# Patient Record
Sex: Female | Born: 1965
Health system: Southern US, Community
[De-identification: ages and names within clinical notes are randomized; demographics above are authoritative.]

## PROBLEM LIST (undated history)

## (undated) DIAGNOSIS — M5430 Sciatica, unspecified side: Secondary | ICD-10-CM

## (undated) DIAGNOSIS — Z889 Allergy status to unspecified drugs, medicaments and biological substances status: Secondary | ICD-10-CM

## (undated) DIAGNOSIS — M4800 Spinal stenosis, site unspecified: Secondary | ICD-10-CM

## (undated) DIAGNOSIS — H9311 Tinnitus, right ear: Secondary | ICD-10-CM

## (undated) DIAGNOSIS — R51 Headache: Secondary | ICD-10-CM

## (undated) DIAGNOSIS — M549 Dorsalgia, unspecified: Secondary | ICD-10-CM

## (undated) DIAGNOSIS — M797 Fibromyalgia: Secondary | ICD-10-CM

## (undated) DIAGNOSIS — J449 Chronic obstructive pulmonary disease, unspecified: Secondary | ICD-10-CM

## (undated) DIAGNOSIS — F329 Major depressive disorder, single episode, unspecified: Secondary | ICD-10-CM

## (undated) DIAGNOSIS — F32A Depression, unspecified: Secondary | ICD-10-CM

## (undated) DIAGNOSIS — M1990 Unspecified osteoarthritis, unspecified site: Secondary | ICD-10-CM

## (undated) DIAGNOSIS — K219 Gastro-esophageal reflux disease without esophagitis: Secondary | ICD-10-CM

## (undated) DIAGNOSIS — F419 Anxiety disorder, unspecified: Secondary | ICD-10-CM

## (undated) DIAGNOSIS — E039 Hypothyroidism, unspecified: Secondary | ICD-10-CM

## (undated) DIAGNOSIS — M810 Age-related osteoporosis without current pathological fracture: Secondary | ICD-10-CM

## (undated) DIAGNOSIS — M51369 Other intervertebral disc degeneration, lumbar region without mention of lumbar back pain or lower extremity pain: Secondary | ICD-10-CM

## (undated) DIAGNOSIS — K5900 Constipation, unspecified: Secondary | ICD-10-CM

## (undated) DIAGNOSIS — G8929 Other chronic pain: Secondary | ICD-10-CM

## (undated) DIAGNOSIS — R0600 Dyspnea, unspecified: Secondary | ICD-10-CM

## (undated) DIAGNOSIS — R519 Headache, unspecified: Secondary | ICD-10-CM

## (undated) DIAGNOSIS — J45909 Unspecified asthma, uncomplicated: Secondary | ICD-10-CM

## (undated) DIAGNOSIS — J189 Pneumonia, unspecified organism: Secondary | ICD-10-CM

## (undated) DIAGNOSIS — M5136 Other intervertebral disc degeneration, lumbar region: Secondary | ICD-10-CM

## (undated) HISTORY — DX: Sciatica, unspecified side: M54.30

## (undated) HISTORY — PX: CHOLECYSTECTOMY: SHX55

## (undated) HISTORY — DX: Constipation, unspecified: K59.00

## (undated) HISTORY — DX: Unspecified asthma, uncomplicated: J45.909

## (undated) HISTORY — DX: Other intervertebral disc degeneration, lumbar region without mention of lumbar back pain or lower extremity pain: M51.369

## (undated) HISTORY — PX: APPENDECTOMY: SHX54

## (undated) HISTORY — DX: Other intervertebral disc degeneration, lumbar region: M51.36

---

## 1998-04-03 ENCOUNTER — Other Ambulatory Visit: Admission: RE | Admit: 1998-04-03 | Discharge: 1998-04-03 | Payer: Self-pay | Admitting: Family Medicine

## 2001-09-22 ENCOUNTER — Other Ambulatory Visit: Admission: RE | Admit: 2001-09-22 | Discharge: 2001-09-22 | Payer: Self-pay | Admitting: Family Medicine

## 2002-03-20 ENCOUNTER — Observation Stay (HOSPITAL_COMMUNITY): Admission: EM | Admit: 2002-03-20 | Discharge: 2002-03-21 | Payer: Self-pay | Admitting: Emergency Medicine

## 2002-03-21 ENCOUNTER — Inpatient Hospital Stay (HOSPITAL_COMMUNITY): Admission: EM | Admit: 2002-03-21 | Discharge: 2002-03-24 | Payer: Self-pay | Admitting: Psychiatry

## 2003-05-12 ENCOUNTER — Other Ambulatory Visit: Admission: RE | Admit: 2003-05-12 | Discharge: 2003-05-12 | Payer: Self-pay | Admitting: Family Medicine

## 2003-05-15 ENCOUNTER — Other Ambulatory Visit: Admission: RE | Admit: 2003-05-15 | Discharge: 2003-05-15 | Payer: Self-pay | Admitting: Family Medicine

## 2003-07-28 ENCOUNTER — Emergency Department (HOSPITAL_COMMUNITY): Admission: EM | Admit: 2003-07-28 | Discharge: 2003-07-28 | Payer: Self-pay | Admitting: Emergency Medicine

## 2006-09-15 DIAGNOSIS — J189 Pneumonia, unspecified organism: Secondary | ICD-10-CM

## 2006-09-15 HISTORY — DX: Pneumonia, unspecified organism: J18.9

## 2006-10-17 ENCOUNTER — Emergency Department (HOSPITAL_COMMUNITY): Admission: EM | Admit: 2006-10-17 | Discharge: 2006-10-17 | Payer: Self-pay | Admitting: Emergency Medicine

## 2012-02-05 ENCOUNTER — Ambulatory Visit: Payer: BC Managed Care – PPO | Attending: Orthopaedic Surgery | Admitting: Physical Therapy

## 2012-02-05 DIAGNOSIS — R5381 Other malaise: Secondary | ICD-10-CM | POA: Insufficient documentation

## 2012-02-05 DIAGNOSIS — R293 Abnormal posture: Secondary | ICD-10-CM | POA: Insufficient documentation

## 2012-02-05 DIAGNOSIS — M2560 Stiffness of unspecified joint, not elsewhere classified: Secondary | ICD-10-CM | POA: Insufficient documentation

## 2012-02-05 DIAGNOSIS — M542 Cervicalgia: Secondary | ICD-10-CM | POA: Insufficient documentation

## 2012-02-05 DIAGNOSIS — IMO0001 Reserved for inherently not codable concepts without codable children: Secondary | ICD-10-CM | POA: Insufficient documentation

## 2012-02-10 ENCOUNTER — Ambulatory Visit: Payer: BC Managed Care – PPO | Admitting: Physical Therapy

## 2012-02-12 ENCOUNTER — Ambulatory Visit: Payer: BC Managed Care – PPO | Admitting: Physical Therapy

## 2012-02-17 ENCOUNTER — Ambulatory Visit: Payer: BC Managed Care – PPO | Attending: Orthopaedic Surgery | Admitting: *Deleted

## 2012-02-17 DIAGNOSIS — IMO0001 Reserved for inherently not codable concepts without codable children: Secondary | ICD-10-CM | POA: Insufficient documentation

## 2012-02-17 DIAGNOSIS — R5381 Other malaise: Secondary | ICD-10-CM | POA: Insufficient documentation

## 2012-02-17 DIAGNOSIS — M2560 Stiffness of unspecified joint, not elsewhere classified: Secondary | ICD-10-CM | POA: Insufficient documentation

## 2012-02-17 DIAGNOSIS — M542 Cervicalgia: Secondary | ICD-10-CM | POA: Insufficient documentation

## 2012-02-17 DIAGNOSIS — R293 Abnormal posture: Secondary | ICD-10-CM | POA: Insufficient documentation

## 2012-02-19 ENCOUNTER — Encounter: Payer: BC Managed Care – PPO | Admitting: *Deleted

## 2012-02-24 ENCOUNTER — Ambulatory Visit: Payer: BC Managed Care – PPO | Admitting: Physical Therapy

## 2012-02-26 ENCOUNTER — Ambulatory Visit: Payer: BC Managed Care – PPO | Admitting: Physical Therapy

## 2012-04-06 ENCOUNTER — Ambulatory Visit: Payer: BC Managed Care – PPO | Attending: Orthopaedic Surgery | Admitting: Physical Therapy

## 2012-04-06 DIAGNOSIS — M542 Cervicalgia: Secondary | ICD-10-CM | POA: Insufficient documentation

## 2012-04-06 DIAGNOSIS — R293 Abnormal posture: Secondary | ICD-10-CM | POA: Insufficient documentation

## 2012-04-06 DIAGNOSIS — M2560 Stiffness of unspecified joint, not elsewhere classified: Secondary | ICD-10-CM | POA: Insufficient documentation

## 2012-04-06 DIAGNOSIS — IMO0001 Reserved for inherently not codable concepts without codable children: Secondary | ICD-10-CM | POA: Insufficient documentation

## 2012-04-06 DIAGNOSIS — R5381 Other malaise: Secondary | ICD-10-CM | POA: Insufficient documentation

## 2012-04-08 ENCOUNTER — Ambulatory Visit: Payer: BC Managed Care – PPO | Admitting: *Deleted

## 2012-04-13 ENCOUNTER — Ambulatory Visit: Payer: BC Managed Care – PPO | Admitting: *Deleted

## 2012-04-15 ENCOUNTER — Ambulatory Visit: Payer: BC Managed Care – PPO | Attending: Orthopaedic Surgery | Admitting: *Deleted

## 2012-04-15 DIAGNOSIS — M2560 Stiffness of unspecified joint, not elsewhere classified: Secondary | ICD-10-CM | POA: Insufficient documentation

## 2012-04-15 DIAGNOSIS — R293 Abnormal posture: Secondary | ICD-10-CM | POA: Insufficient documentation

## 2012-04-15 DIAGNOSIS — M542 Cervicalgia: Secondary | ICD-10-CM | POA: Insufficient documentation

## 2012-04-15 DIAGNOSIS — R5381 Other malaise: Secondary | ICD-10-CM | POA: Insufficient documentation

## 2012-04-15 DIAGNOSIS — IMO0001 Reserved for inherently not codable concepts without codable children: Secondary | ICD-10-CM | POA: Insufficient documentation

## 2013-10-03 ENCOUNTER — Encounter (HOSPITAL_COMMUNITY): Payer: Self-pay | Admitting: Emergency Medicine

## 2013-10-03 ENCOUNTER — Emergency Department (HOSPITAL_COMMUNITY): Payer: BC Managed Care – PPO

## 2013-10-03 ENCOUNTER — Emergency Department (HOSPITAL_COMMUNITY)
Admission: EM | Admit: 2013-10-03 | Discharge: 2013-10-03 | Disposition: A | Discharge status: Home / Self Care | Payer: BC Managed Care – PPO | Attending: Emergency Medicine | Admitting: Emergency Medicine

## 2013-10-03 DIAGNOSIS — R000 Tachycardia, unspecified: Secondary | ICD-10-CM | POA: Insufficient documentation

## 2013-10-03 DIAGNOSIS — J441 Chronic obstructive pulmonary disease with (acute) exacerbation: Secondary | ICD-10-CM | POA: Insufficient documentation

## 2013-10-03 DIAGNOSIS — Z792 Long term (current) use of antibiotics: Secondary | ICD-10-CM | POA: Insufficient documentation

## 2013-10-03 DIAGNOSIS — F17200 Nicotine dependence, unspecified, uncomplicated: Secondary | ICD-10-CM | POA: Insufficient documentation

## 2013-10-03 HISTORY — DX: Chronic obstructive pulmonary disease, unspecified: J44.9

## 2013-10-03 LAB — CBC WITH DIFFERENTIAL/PLATELET
Basophils Absolute: 0 10*3/uL (ref 0.0–0.1)
Basophils Relative: 0 % (ref 0–1)
Eosinophils Absolute: 0 10*3/uL (ref 0.0–0.7)
Eosinophils Relative: 0 % (ref 0–5)
HCT: 43.2 % (ref 36.0–46.0)
Hemoglobin: 15.5 g/dL — ABNORMAL HIGH (ref 12.0–15.0)
Lymphocytes Relative: 24 % (ref 12–46)
Lymphs Abs: 2.2 10*3/uL (ref 0.7–4.0)
MCH: 34.9 pg — ABNORMAL HIGH (ref 26.0–34.0)
MCHC: 35.9 g/dL (ref 30.0–36.0)
MCV: 97.3 fL (ref 78.0–100.0)
Monocytes Absolute: 0.7 10*3/uL (ref 0.1–1.0)
Monocytes Relative: 8 % (ref 3–12)
Neutro Abs: 6.4 10*3/uL (ref 1.7–7.7)
Neutrophils Relative %: 68 % (ref 43–77)
Platelets: 255 10*3/uL (ref 150–400)
RBC: 4.44 MIL/uL (ref 3.87–5.11)
RDW: 12.8 % (ref 11.5–15.5)
WBC: 9.4 10*3/uL (ref 4.0–10.5)

## 2013-10-03 LAB — BASIC METABOLIC PANEL
BUN: 16 mg/dL (ref 6–23)
CO2: 22 mEq/L (ref 19–32)
Calcium: 8.9 mg/dL (ref 8.4–10.5)
Chloride: 106 mEq/L (ref 96–112)
Creatinine, Ser: 0.79 mg/dL (ref 0.50–1.10)
GFR calc Af Amer: >90 mL/min (ref >90)
GFR calc non Af Amer: >90 mL/min (ref >90)
Glucose, Bld: 113 mg/dL — ABNORMAL HIGH (ref 70–99)
Potassium: 3.5 mEq/L — ABNORMAL LOW (ref 3.7–5.3)
Sodium: 142 mEq/L (ref 137–147)

## 2013-10-03 MED ORDER — POTASSIUM CHLORIDE CRYS ER 20 MEQ PO TBCR
20.0000 meq | EXTENDED_RELEASE_TABLET | Freq: Once | ORAL | Status: AC
  Administered 2013-10-03: 20 meq via ORAL
  Filled 2013-10-03: qty 1

## 2013-10-03 MED ORDER — IBUPROFEN 400 MG PO TABS
600.0000 mg | ORAL_TABLET | Freq: Once | ORAL | Status: AC
  Administered 2013-10-03: 600 mg via ORAL
  Filled 2013-10-03: qty 2

## 2013-10-03 MED ORDER — PREDNISONE 20 MG PO TABS: 40.0000 mg | ORAL_TABLET | Freq: Every day | ORAL | Status: DC

## 2013-10-03 MED ORDER — GUAIFENESIN-CODEINE 100-10 MG/5ML PO SOLN
10.0000 mL | Freq: Once | ORAL | Status: AC
  Administered 2013-10-03: 10 mL via ORAL
  Filled 2013-10-03: qty 10

## 2013-10-03 MED ORDER — IPRATROPIUM BROMIDE 0.02 % IN SOLN
0.5000 mg | Freq: Once | RESPIRATORY_TRACT | Status: AC
  Administered 2013-10-03: 0.5 mg via RESPIRATORY_TRACT
  Filled 2013-10-03: qty 2.5

## 2013-10-03 MED ORDER — ALBUTEROL SULFATE HFA 108 (90 BASE) MCG/ACT IN AERS
2.0000 | INHALATION_SPRAY | Freq: Once | RESPIRATORY_TRACT | Status: AC
  Administered 2013-10-03: 2 via RESPIRATORY_TRACT
  Filled 2013-10-03: qty 6.7

## 2013-10-03 MED ORDER — ALBUTEROL SULFATE (2.5 MG/3ML) 0.083% IN NEBU
5.0000 mg | INHALATION_SOLUTION | Freq: Once | RESPIRATORY_TRACT | Status: AC
  Administered 2013-10-03: 5 mg via RESPIRATORY_TRACT
  Filled 2013-10-03: qty 6

## 2013-10-03 MED ORDER — ALBUTEROL SULFATE (2.5 MG/3ML) 0.083% IN NEBU: 2.5000 mg | INHALATION_SOLUTION | Freq: Four times a day (QID) | RESPIRATORY_TRACT | Status: DC | PRN

## 2013-10-03 MED ORDER — GUAIFENESIN-CODEINE 100-10 MG/5ML PO SOLN
ORAL | Status: AC
  Filled 2013-10-03: qty 5

## 2013-10-03 MED ORDER — METHYLPREDNISOLONE SODIUM SUCC 125 MG IJ SOLR
125.0000 mg | Freq: Once | INTRAMUSCULAR | Status: AC
  Administered 2013-10-03: 125 mg via INTRAVENOUS
  Filled 2013-10-03: qty 2

## 2013-10-03 NOTE — ED Notes (Signed)
Patient states she has seen her doctor in December and the doctor "thought I had a touch of bronchitis because I have COPD, but it has turned into pneumonia before." Per patient, she has taken six days of her antibiotic and she has four left. "You think I would be feeling better by now."

## 2013-10-03 NOTE — Discharge Instructions (Signed)
Chronic Obstructive Pulmonary Disease Exacerbation °Chronic obstructive pulmonary disease (COPD) is a common lung condition in which airflow from the lungs is limited. COPD is a general term that can be used to describe many different lung problems that limit airflow, including chronic bronchitis and emphysema. COPD exacerbations are episodes when breathing symptoms become much worse and require extra treatment. Without treatment, COPD exacerbations can be life threatening, and frequent COPD exacerbations can cause further damage to your lungs. °CAUSES  °· Respiratory infections.   °· Exposure to smoke.   °· Exposure to air pollution, chemical fumes, or dust. °Sometimes there is no apparent cause or trigger. °RISK FACTORS °· Smoking cigarettes. °· Older age. °· Frequent prior COPD exacerbations. °SIGNS AND SYMPTOMS  °· Increased coughing.   °· Increased thick spit (sputum) production.   °· Increased wheezing.   °· Increased shortness of breath.   °· Rapid breathing.   °· Chest tightness. °DIAGNOSIS  °Your medical history, a physical exam, and tests will help your health care provider make a diagnosis. Tests may include: °· A chest X-ray. °· Basic lab tests. °· Sputum testing. °· An arterial blood gas test. °TREATMENT  °Depending on the severity of your COPD exacerbation, you may need to be admitted to a hospital for treatment. Some of the treatments commonly used to treat COPD exacerbations are:  °· Antibiotic medicines.   °· Bronchodilators. These are drugs that expand the air passages. They may be given with an inhaler or nebulizer. Spacer devices may be needed to help improve drug delivery. °· Corticosteroid medicines. °· Supplemental oxygen therapy.   °HOME CARE INSTRUCTIONS  °· Do not smoke. Quitting smoking is very important to prevent COPD from getting worse and exacerbations from happening as often. °· Avoid exposure to all substances that irritate the airway, especially to tobacco smoke.   °· If prescribed,  take your antibiotics as directed. Finish them even if you start to feel better. °· Only take over-the-counter or prescription medicines as directed by your health care provider. It is important to use correct technique with inhaled medicines. °· Drink enough fluids to keep your urine clear or pale yellow (unless you have a medical condition that requires fluid restriction). °· Use a cool mist vaporizer. This makes it easier to clear your chest when you cough.   °· If you have a home nebulizer and oxygen, continue to use them as directed.   °· Maintain all necessary vaccinations to prevent infections.   °· Exercise regularly.   °· Eat a healthy diet.   °· Keep all follow-up appointments as directed by your health care provider. °SEEK IMMEDIATE MEDICAL CARE IF: °· You have worsening shortness of breath.   °· You have trouble talking.   °· You have severe chest pain. °· You have blood in your sputum.  °· You have a fever. °· You have weakness, vomit repeatedly, or faint.   °· You feel confused.   °· You continue to get worse. °MAKE SURE YOU:  °· Understand these instructions. °· Will watch your condition. °· Will get help right away if you are not doing well or get worse. °Document Released: 06/29/2007 Document Revised: 06/22/2013 Document Reviewed: 05/06/2013 °[employer]® Patient Information ©2014 [employer]. ° °

## 2013-10-03 NOTE — ED Notes (Signed)
Patient is currently taking Albuterol treatment.

## 2013-10-03 NOTE — ED Notes (Signed)
Patient transported to X-ray 

## 2013-10-03 NOTE — ED Provider Notes (Signed)
CSN: [id number]     Arrival date & time 10/03/13  0500 History   First MD Initiated Contact with Patient 10/03/13 [phone]     Chief Complaint  Patient presents with  . Shortness of Breath   (Consider location/radiation/quality/duration/timing/severity/associated sxs/prior Treatment) HPI  48 year old female with shortness of breath. Patient has a past history COPD. She's not on home oxygen. She continues to smoke. Her breathing began worsening approximately one and half weeks ago. Increased frequency of her cough. Shortness of breath with less exertion than typical. Diffuse body aches. She denies any specific chest pain. Subjective fever. No unusual leg pain or swelling. She reports was called in a prescription for steroids and erythromycin by her PCP approximately one week ago. She still and antibiotics were discussed with her steroids. Despite this, she feels like her symptoms are continuing to worsen.  Past Medical History  Diagnosis Date  . COPD (chronic obstructive pulmonary disease)    Past Surgical History  Procedure Laterality Date  . Cholecystectomy     History reviewed. No pertinent family history. History  Substance Use Topics  . Smoking status: Current Every Day Smoker  . Smokeless tobacco: Not on file  . Alcohol Use: No   OB History   Grav Para Term Preterm Abortions TAB SAB Ect Mult Living                 Review of Systems  All systems reviewed and negative, other than as noted in HPI.   Allergies  Morphine and related  Home Medications   Current Outpatient Rx  Name  Route  Sig  Dispense  Refill  . clarithromycin (BIAXIN) 500 MG tablet   Oral   Take 500 mg by mouth 2 (two) times daily.          BP 131/80  Pulse 98  Temp(Src) 97.6 F (36.4 C) (Oral)  Resp 24  SpO2 97% Physical Exam  Nursing note and vitals reviewed. Constitutional: She appears well-developed and well-nourished. No distress.  HENT:  Head: Normocephalic and atraumatic.  Eyes:  Conjunctivae are normal. Right eye exhibits no discharge. Left eye exhibits no discharge.  Neck: Neck supple.  Cardiovascular: Regular rhythm and normal heart sounds.  Exam reveals no gallop and no friction rub.   No murmur heard. Tachycardic  Pulmonary/Chest: Effort normal and breath sounds normal. No respiratory distress.  Expiratory wheezing bilaterally. Prolonged expiratory phase.  Abdominal: Soft. She exhibits no distension. There is no tenderness.  Musculoskeletal: She exhibits no edema and no tenderness.  Lower extremities symmetric as compared to each other. No calf tenderness. Negative Homan's. No palpable cords.   Neurological: She is alert.  Skin: Skin is warm and dry.  Psychiatric: She has a normal mood and affect. Her behavior is normal. Thought content normal.    ED Course  Procedures (including critical care time) Labs Review Labs Reviewed  CBC WITH DIFFERENTIAL - Abnormal; Notable for the following:    Hemoglobin 15.5 (*)    MCH 34.9 (*)    All other components within normal limits  BASIC METABOLIC PANEL - Abnormal; Notable for the following:    Potassium 3.5 (*)    Glucose, Bld 113 (*)    All other components within normal limits   Imaging Review Dg Chest 2 View  10/03/2013   CLINICAL DATA:  Cough, congestion  EXAM: CHEST  2 VIEW  COMPARISON:  December 06, 2008  FINDINGS: The heart size and mediastinal contours are within normal limits. There is no  focal infiltrate, pulmonary edema, or pleural effusion. The visualized skeletal structures are stable.  IMPRESSION: No active cardiopulmonary disease.   Electronically Signed   By: Abelardo Diesel M.D.   On: 10/03/2013 07:39    EKG Interpretation    Date/Time:  Monday October 03 2013 05:49:00 EST Ventricular Rate:  98 PR Interval:  132 QRS Duration: 76 QT Interval:  354 QTC Calculation: 451 R Axis:   79 Text Interpretation:  Normal sinus rhythm Normal ECG No previous ECGs available Confirmed by Alzada Brazee  MD, Quanna Wittke  ([id number]) on 10/03/2013 7:22:57 AM            MDM   1. COPD exacerbation     48 year old female with cough and shortness of breath. Likely COPD exacerbation. Oxygen saturations are adequate room air. Her chest x-ray does not show any focal infiltrate, but given change in her cough and increasing shortness of breath or history of underlying COPD, will treat with antibiotics. Feel she is appropriate for outpatient treatment at this time. Steroids. As needed bronchodilators. Return precautions discussed. Outpatient followup otherwise.    Virgel Manifold, MD 10/04/13 [phone]

## 2013-10-03 NOTE — ED Notes (Signed)
Pt c/o sob and generalized bodyaches x 1 wk.

## 2013-10-11 ENCOUNTER — Ambulatory Visit (INDEPENDENT_AMBULATORY_CARE_PROVIDER_SITE_OTHER): Payer: BC Managed Care – PPO | Admitting: Emergency Medicine

## 2013-10-11 ENCOUNTER — Encounter: Payer: Self-pay | Admitting: Emergency Medicine

## 2013-10-11 VITALS — BP 124/88 | HR 88 | Ht 64.0 in | Wt 139.4 lb

## 2013-10-11 DIAGNOSIS — J101 Influenza due to other identified influenza virus with other respiratory manifestations: Secondary | ICD-10-CM | POA: Insufficient documentation

## 2013-10-11 DIAGNOSIS — J111 Influenza due to unidentified influenza virus with other respiratory manifestations: Secondary | ICD-10-CM

## 2013-10-11 DIAGNOSIS — J449 Chronic obstructive pulmonary disease, unspecified: Secondary | ICD-10-CM

## 2013-10-11 DIAGNOSIS — B370 Candidal stomatitis: Secondary | ICD-10-CM

## 2013-10-11 MED ORDER — FLUCONAZOLE 100 MG PO TABS: 100.0000 mg | ORAL_TABLET | Freq: Every day | ORAL | Status: DC

## 2013-10-11 MED ORDER — OSELTAMIVIR PHOSPHATE 75 MG PO CAPS: 75.0000 mg | ORAL_CAPSULE | Freq: Two times a day (BID) | ORAL | Status: DC

## 2013-10-11 MED ORDER — HYDROCODONE-HOMATROPINE 5-1.5 MG/5ML PO SYRP: 5.0000 mL | ORAL_SOLUTION | Freq: Four times a day (QID) | ORAL | Status: DC | PRN

## 2013-10-11 NOTE — Progress Notes (Signed)
Subjective:    Patient ID: Gloria Calderon, female    DOB: [birth year], 48 y.o.   MRN: [id number]  HPI 48 yo woman, smoker 60 pk-yrs, hx of allergies. She is referred for COPD after a recent trip to ED. She averages an episode of bronchitis annually, was started on abx. She continued to cough and have trouble prompting trip to the [facility] ED on 10/03/13. She finished abx and prednisone. Her breathing has improved. Continues to have aches, fever, HA, now with N/V.     Review of Systems  Constitutional: Positive for appetite change. Negative for fever and unexpected weight change.  HENT: Positive for congestion, postnasal drip, sinus pressure and sore throat. Negative for dental problem, ear pain, nosebleeds, rhinorrhea, sneezing and trouble swallowing.   Eyes: Negative for redness and itching.  Respiratory: Positive for cough, chest tightness, shortness of breath and wheezing.   Cardiovascular: Negative for palpitations and leg swelling.  Gastrointestinal: Positive for abdominal pain. Negative for nausea and vomiting.  Genitourinary: Negative for dysuria.  Musculoskeletal: Negative for joint swelling.  Skin: Negative for rash.  Neurological: Positive for headaches.  Hematological: Does not bruise/bleed easily.  Psychiatric/Behavioral: Negative for dysphoric mood. The patient is not nervous/anxious.     Past Medical History  Diagnosis Date  . COPD (chronic obstructive pulmonary disease)      No family history on file.   History   Social History  . Marital Status: Legally Separated    Spouse Name: N/A    Number of Children: N/A  . Years of Education: N/A   Occupational History  . Not on file.   Social History Main Topics  . Smoking status: Current Every Day Smoker -- 0.25 packs/day for 30 years    Types: Cigarettes  . Smokeless tobacco: Not on file  . Alcohol Use: No  . Drug Use: Not on file  . Sexual Activity: Not on file   Other Topics Concern  . Not on file   Social  History Narrative  . 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  [place] native No military   Allergies  Allergen Reactions  . Morphine And Related      Outpatient Prescriptions Prior to Visit  Medication Sig Dispense Refill  . albuterol (PROVENTIL) (2.5 MG/3ML) 0.083% nebulizer solution Take 3 mLs (2.5 mg total) by nebulization every 6 (six) hours as needed for wheezing or shortness of breath.  75 mL  3  . clarithromycin (BIAXIN) 500 MG tablet Take 500 mg by mouth 2 (two) times daily.      . predniSONE (DELTASONE) 20 MG tablet Take 2 tablets (40 mg total) by mouth daily.  10 tablet  0   No facility-administered medications prior to visit.        Objective:   Physical Exam Filed Vitals:   10/11/13 1539  BP: 124/88  Pulse: 88  Height: 5\' 4"  (1.626 m)  Weight: 139 lb 6.4 oz (63.231 kg)  SpO2: 97%   Gen: Pleasant, well-nourished, in no distress, ill appearing  ENT: No lesions,  mouth clear,  oropharynx clear, no postnasal drip  Neck: No JVD, no TMG, no carotid bruits  Lungs: No use of accessory muscles, B low pitched wheezes  Cardiovascular: RRR, heart sounds normal, no murmur or gallops, no peripheral edema  Musculoskeletal: No deformities, no cyanosis or clubbing  Neuro: alert, non focal  Skin: Warm, no lesions or rashes      Assessment & Plan:  Influenza with  respiratory manifestations - symptomatic relief - tamiflu script - hycodan   COPD (chronic obstructive pulmonary disease) - high risk for another AE; she will call if she develops sx. Will treat again with pred + abx if so - full PFT - a1-AT in the future - start scheduled BD in the future  Thrush - treat with fluconazole x 3 days

## 2013-10-11 NOTE — Assessment & Plan Note (Signed)
-   symptomatic relief - tamiflu script - hycodan

## 2013-10-11 NOTE — Patient Instructions (Signed)
We will give a script for tamiflu - check with the pharmacy to see how much it would cost Use OTC cold-flu decongestants for symptoms We will perform pulmonary function tests and blood work after you have recovered from this illness Use hycodan as needed for cough Follow with Dr Lamonte Sakai in 1 month

## 2013-10-11 NOTE — Assessment & Plan Note (Addendum)
-   treat with fluconazole x 3 days - ? Etiology, consider checking HIV next visit

## 2013-10-11 NOTE — Assessment & Plan Note (Signed)
-   high risk for another AE; she will call if she develops sx. Will treat again with pred + abx if so - full PFT - a1-AT in the future - start scheduled BD in the future

## 2013-11-11 ENCOUNTER — Other Ambulatory Visit: Payer: Self-pay | Admitting: *Deleted

## 2013-11-15 ENCOUNTER — Encounter: Payer: Self-pay | Admitting: Emergency Medicine

## 2013-11-15 ENCOUNTER — Ambulatory Visit (INDEPENDENT_AMBULATORY_CARE_PROVIDER_SITE_OTHER): Payer: BC Managed Care – PPO | Admitting: Emergency Medicine

## 2013-11-15 ENCOUNTER — Other Ambulatory Visit: Payer: BC Managed Care – PPO

## 2013-11-15 VITALS — BP 142/90 | HR 106 | Ht 65.0 in | Wt 140.0 lb

## 2013-11-15 DIAGNOSIS — J449 Chronic obstructive pulmonary disease, unspecified: Secondary | ICD-10-CM

## 2013-11-15 NOTE — Assessment & Plan Note (Signed)
-   trial spiriva x 1 month - walking oximetry - PFT  - a1-AT - rov 1

## 2013-11-15 NOTE — Patient Instructions (Signed)
We will perform full breathing tests Walking oximetry today We will start Spiriva once a day Blood work today  We agreed that you would try to decrease your cigarettes to 1 pack a day for the next month.  Follow with Dr Lamonte Sakai in 1 month

## 2013-11-15 NOTE — Progress Notes (Signed)
  Subjective:    Patient ID: Gloria Calderon, female    DOB: 1966-06-11, 48 y.o.   MRN: [id number]  HPI 48 yo woman, smoker 60 pk-yrs, hx of allergies. She is referred for COPD after a recent trip to ED. She averages an episode of bronchitis annually, was started on abx. She continued to cough and have trouble prompting trip to the [facility] ED on 10/03/13. She finished abx and prednisone. Her breathing has improved. Continues to have aches, fever, HA, now with N/V.    ROV 11/15/13 -- hx tobacco and new dx COPD. Last time we treated her for presumed influenza - she never took the tamiflu. Still smoking ~1pk/day.    Review of Systems  Constitutional: Positive for appetite change. Negative for fever and unexpected weight change.  HENT: Positive for congestion, postnasal drip, sinus pressure and sore throat. Negative for dental problem, ear pain, nosebleeds, rhinorrhea, sneezing and trouble swallowing.   Eyes: Negative for redness and itching.  Respiratory: Positive for cough, chest tightness, shortness of breath and wheezing.   Cardiovascular: Negative for palpitations and leg swelling.  Gastrointestinal: Positive for abdominal pain. Negative for nausea and vomiting.  Genitourinary: Negative for dysuria.  Musculoskeletal: Negative for joint swelling.  Skin: Negative for rash.  Neurological: Positive for headaches.  Hematological: Does not bruise/bleed easily.  Psychiatric/Behavioral: Negative for dysphoric mood. The patient is not nervous/anxious.        Objective:   Physical Exam Filed Vitals:   11/15/13 1356  BP: 142/90  Pulse: 106  Height: 5\' 5"  (1.651 m)  Weight: 140 lb (63.504 kg)  SpO2: 98%   Gen: Pleasant, well-nourished, in no distress, ill appearing  ENT: No lesions,  mouth clear,  oropharynx clear, no postnasal drip  Neck: No JVD, no TMG, no carotid bruits  Lungs: No use of accessory muscles, B low pitched wheezes  Cardiovascular: RRR, heart sounds normal, no murmur or  gallops, no peripheral edema  Musculoskeletal: No deformities, no cyanosis or clubbing  Neuro: alert, non focal  Skin: Warm, no lesions or rashes      Assessment & Plan:  COPD (chronic obstructive pulmonary disease) - trial spiriva x 1 month - walking oximetry - PFT  - a1-AT - rov 1

## 2013-11-16 ENCOUNTER — Telehealth: Payer: Self-pay | Admitting: Emergency Medicine

## 2013-11-16 NOTE — Telephone Encounter (Signed)
Send nystatin S&S bid x 4 days

## 2013-11-16 NOTE — Telephone Encounter (Signed)
Pt states that Dr Lamonte Sakai was supposed to have sent in a rx for mouthwash for thrush.  Pharmacy never received this and no record of it in EMR.  Dr Lamonte Sakai, please advise what to send in.

## 2013-11-16 NOTE — Telephone Encounter (Signed)
Spoke with pt. She wanted to make sure that Spiriva was supposed to be a capsule and not a tablet. Advised that Spiriva only comes in capsule form. She agreed and verbalized understanding. Nothing further was needed.

## 2013-11-17 ENCOUNTER — Telehealth: Payer: Self-pay | Admitting: Emergency Medicine

## 2013-11-17 MED ORDER — NYSTATIN 100000 UNIT/GM EX POWD: CUTANEOUS | Status: DC

## 2013-11-17 NOTE — Telephone Encounter (Signed)
lmtcb x1 Need to know which prescription it is that she needs.

## 2013-11-17 NOTE — Telephone Encounter (Signed)
Called and left detailed message advising pt had sent nystatin swish and swollow into pharm.  Nothing further needed

## 2013-11-18 MED ORDER — NYSTATIN 100000 UNIT/ML MT SUSP: 5.0000 mL | Freq: Two times a day (BID) | OROMUCOSAL | Status: DC

## 2013-11-18 NOTE — Telephone Encounter (Signed)
Pt returning call.Gloria Calderon ° °

## 2013-11-18 NOTE — Telephone Encounter (Signed)
According to previous phone note pt was requesting nystatin mouth wash. This was called in 11-17-13 AM to [facility] I ATC to check on rx but they are closed until 9am. I will call back. I also LMTCBx2 for the [place], CMA

## 2013-11-18 NOTE — Telephone Encounter (Signed)
Pt is calling about thrush prescription. Per RB pt should be given Nystatin suspension. The previous message about this, nystatin powder was sent in. [facility] is giving the pt a hard time about this. Correct prescription has been sent in. Nothing further is needed. Advised pt to call back if there are any further problems.

## 2013-11-21 LAB — ALPHA-1 ANTITRYPSIN PHENOTYPE: A1 ANTITRYPSIN: 138 mg/dL (ref 83–199)

## 2013-12-07 ENCOUNTER — Telehealth: Payer: Self-pay | Admitting: Emergency Medicine

## 2013-12-07 NOTE — Telephone Encounter (Signed)
Advised pt that it is ok to use allergy medication for sinus symptoms. Nothing further needed

## 2013-12-12 ENCOUNTER — Other Ambulatory Visit: Payer: Self-pay | Admitting: Emergency Medicine

## 2013-12-12 DIAGNOSIS — J449 Chronic obstructive pulmonary disease, unspecified: Secondary | ICD-10-CM

## 2013-12-19 ENCOUNTER — Ambulatory Visit: Payer: BC Managed Care – PPO | Admitting: Emergency Medicine

## 2013-12-23 ENCOUNTER — Ambulatory Visit: Payer: BC Managed Care – PPO | Admitting: Emergency Medicine

## 2013-12-28 ENCOUNTER — Encounter (HOSPITAL_COMMUNITY): Payer: BC Managed Care – PPO

## 2013-12-28 ENCOUNTER — Ambulatory Visit: Payer: BC Managed Care – PPO | Admitting: Emergency Medicine

## 2014-01-05 ENCOUNTER — Ambulatory Visit (INDEPENDENT_AMBULATORY_CARE_PROVIDER_SITE_OTHER): Payer: BC Managed Care – PPO | Admitting: Emergency Medicine

## 2014-01-05 ENCOUNTER — Encounter: Payer: Self-pay | Admitting: Emergency Medicine

## 2014-01-05 ENCOUNTER — Ambulatory Visit (HOSPITAL_COMMUNITY)
Admission: RE | Admit: 2014-01-05 | Discharge: 2014-01-05 | Disposition: A | Discharge status: Home / Self Care | Payer: BC Managed Care – PPO | Source: Ambulatory Visit | Attending: Emergency Medicine | Admitting: Emergency Medicine

## 2014-01-05 VITALS — BP 122/88 | HR 126 | Ht 63.0 in | Wt 140.0 lb

## 2014-01-05 DIAGNOSIS — J449 Chronic obstructive pulmonary disease, unspecified: Secondary | ICD-10-CM

## 2014-01-05 DIAGNOSIS — J4489 Other specified chronic obstructive pulmonary disease: Secondary | ICD-10-CM | POA: Insufficient documentation

## 2014-01-05 LAB — PULMONARY FUNCTION TEST
DL/VA % pred: 52 %
DL/VA: 2.46 ml/min/mmHg/L
DLCO UNC % PRED: 48 %
DLCO unc: 11.06 ml/min/mmHg
FEF 25-75 Post: 0.45 L/sec
FEF 25-75 Pre: 0.69 L/sec
FEF2575-%CHANGE-POST: -34 %
FEF2575-%PRED-PRE: 24 %
FEF2575-%Pred-Post: 16 %
FEV1-%Change-Post: -15 %
FEV1-%PRED-POST: 45 %
FEV1-%Pred-Pre: 53 %
FEV1-PRE: 1.48 L
FEV1-Post: 1.25 L
FEV1FVC-%Change-Post: -4 %
FEV1FVC-%Pred-Pre: 61 %
FEV6-%Change-Post: -12 %
FEV6-%PRED-POST: 76 %
FEV6-%PRED-PRE: 87 %
FEV6-POST: 2.55 L
FEV6-PRE: 2.93 L
FEV6FVC-%Change-Post: -1 %
FEV6FVC-%PRED-PRE: 101 %
FEV6FVC-%Pred-Post: 100 %
FVC-%CHANGE-POST: -11 %
FVC-%PRED-POST: 75 %
FVC-%Pred-Pre: 85 %
FVC-POST: 2.62 L
FVC-PRE: 2.97 L
PRE FEV1/FVC RATIO: 50 %
PRE FEV6/FVC RATIO: 99 %
Post FEV1/FVC ratio: 48 %
Post FEV6/FVC ratio: 98 %
RV % pred: 122 %
RV: 2.08 L
TLC % pred: 109 %
TLC: 5.35 L

## 2014-01-05 MED ORDER — ALBUTEROL SULFATE (2.5 MG/3ML) 0.083% IN NEBU
2.5000 mg | INHALATION_SOLUTION | Freq: Once | RESPIRATORY_TRACT | Status: AC
  Administered 2014-01-05: 2.5 mg via RESPIRATORY_TRACT

## 2014-01-05 NOTE — Progress Notes (Signed)
  Subjective:    Patient ID: Gloria Calderon, female    DOB: 1965/12/30, 48 y.o.   MRN: [id number]  HPI 48 yo woman, smoker 60 pk-yrs, hx of allergies. She is referred for COPD after a recent trip to ED. She averages an episode of bronchitis annually, was started on abx. She continued to cough and have trouble prompting trip to the [facility] ED on 10/03/13. She finished abx and prednisone. Her breathing has improved. Continues to have aches, fever, HA, now with N/V.    ROV 11/15/13 -- hx tobacco and new dx COPD. Last time we treated her for presumed influenza - she never took the tamiflu. Still smoking ~1pk/day.   ROV 01/05/14 -- follows up for tobacco and COPD. Smoking 1 pk a day. Started spiriva last time and she benefited. She needs albuterol by the end of the day. Coughing less. Was just treated for an AE with abx and pred by Dr Melina Copa.    Review of Systems  Constitutional: Positive for appetite change. Negative for fever and unexpected weight change.  HENT: Positive for congestion, postnasal drip, sinus pressure and sore throat. Negative for dental problem, ear pain, nosebleeds, rhinorrhea, sneezing and trouble swallowing.   Eyes: Negative for redness and itching.  Respiratory: Positive for cough, chest tightness, shortness of breath and wheezing.   Cardiovascular: Negative for palpitations and leg swelling.  Gastrointestinal: Positive for abdominal pain. Negative for nausea and vomiting.  Genitourinary: Negative for dysuria.  Musculoskeletal: Negative for joint swelling.  Skin: Negative for rash.  Neurological: Positive for headaches.  Hematological: Does not bruise/bleed easily.  Psychiatric/Behavioral: Negative for dysphoric mood. The patient is not nervous/anxious.        Objective:   Physical Exam Filed Vitals:   01/05/14 1628  BP: 122/88  Pulse: 126  Height: 5\' 3"  (1.6 m)  Weight: 140 lb (63.504 kg)  SpO2: 97%   Gen: Pleasant, well-nourished, in no distress, ill  appearing  ENT: No lesions,  mouth clear,  oropharynx clear, no postnasal drip  Neck: No JVD, no TMG, no carotid bruits  Lungs: No use of accessory muscles, no wheezes.   Cardiovascular: RRR, heart sounds normal, no murmur or gallops, no peripheral edema  Musculoskeletal: No deformities, no cyanosis or clubbing  Neuro: alert, non focal  Skin: Warm, no lesions or rashes      Assessment & Plan:  COPD (chronic obstructive pulmonary disease) Stable but severe. Most important thing here is to quit smoking.   Please continue your Spiriva daily.  Use albuterol 2 puffs as needed. Continue to work on decreasing your cigarettes.  Follow with Dr Lamonte Sakai in 4 months or sooner if you have any problems.

## 2014-01-05 NOTE — Assessment & Plan Note (Addendum)
Stable but severe. Most important thing here is to quit smoking.   Please continue your Spiriva daily.  Use albuterol 2 puffs as needed. Continue to work on decreasing your cigarettes.  Follow with Dr Lamonte Sakai in 4 months or sooner if you have any problems.

## 2014-01-05 NOTE — Patient Instructions (Signed)
Please continue your Spiriva daily.  Use albuterol 2 puffs as needed. Continue to work on decreasing your cigarettes.  Follow with Dr Lamonte Sakai in 4 months or sooner if you have any problems.

## 2014-01-18 ENCOUNTER — Telehealth: Payer: Self-pay | Admitting: Emergency Medicine

## 2014-01-18 MED ORDER — PREDNISONE 10 MG PO TABS: ORAL_TABLET | ORAL | Status: DC

## 2014-01-18 NOTE — Telephone Encounter (Signed)
Spoke with pt and made aware that per RB pred taper sent to pharm. Pt to call back for sooner appointment if no better once she finishes

## 2014-01-18 NOTE — Telephone Encounter (Signed)
Spoke with pt Pt c/o increase sinus pressure/ pain on left side of head and head congestion x 4 months + Pt states that she is unable to get any mucus our of head Pt currently taking Amoxicillin 1000mg  BID for symptoms. Previously completed course of Clarithromycin before Amoxicillin Requesting prednisone to be sent to pharmacy or any other recs that can be given.  [facility] Allergies  Allergen Reactions  . Morphine And Related    Please advise Dr Lamonte Sakai . Thanks.

## 2014-01-26 ENCOUNTER — Ambulatory Visit (INDEPENDENT_AMBULATORY_CARE_PROVIDER_SITE_OTHER): Payer: BC Managed Care – PPO | Admitting: Otolaryngology

## 2014-01-26 DIAGNOSIS — J310 Chronic rhinitis: Secondary | ICD-10-CM

## 2014-01-26 DIAGNOSIS — R51 Headache: Secondary | ICD-10-CM

## 2014-01-27 ENCOUNTER — Other Ambulatory Visit (INDEPENDENT_AMBULATORY_CARE_PROVIDER_SITE_OTHER): Payer: Self-pay | Admitting: Otolaryngology

## 2014-01-27 DIAGNOSIS — E079 Disorder of thyroid, unspecified: Secondary | ICD-10-CM

## 2014-01-27 DIAGNOSIS — J329 Chronic sinusitis, unspecified: Secondary | ICD-10-CM

## 2014-01-31 ENCOUNTER — Ambulatory Visit (HOSPITAL_COMMUNITY)
Admission: RE | Admit: 2014-01-31 | Discharge: 2014-01-31 | Disposition: A | Discharge status: Home / Self Care | Payer: BC Managed Care – PPO | Source: Ambulatory Visit | Attending: Otolaryngology | Admitting: Otolaryngology

## 2014-01-31 DIAGNOSIS — J3489 Other specified disorders of nose and nasal sinuses: Secondary | ICD-10-CM | POA: Insufficient documentation

## 2014-01-31 DIAGNOSIS — J329 Chronic sinusitis, unspecified: Secondary | ICD-10-CM | POA: Insufficient documentation

## 2014-01-31 DIAGNOSIS — R51 Headache: Secondary | ICD-10-CM | POA: Insufficient documentation

## 2014-02-16 ENCOUNTER — Ambulatory Visit (INDEPENDENT_AMBULATORY_CARE_PROVIDER_SITE_OTHER): Payer: BC Managed Care – PPO | Admitting: Otolaryngology

## 2014-02-16 DIAGNOSIS — J343 Hypertrophy of nasal turbinates: Secondary | ICD-10-CM

## 2014-02-16 DIAGNOSIS — J342 Deviated nasal septum: Secondary | ICD-10-CM

## 2014-04-12 ENCOUNTER — Telehealth: Payer: Self-pay | Admitting: Emergency Medicine

## 2014-04-12 MED ORDER — TIOTROPIUM BROMIDE MONOHYDRATE 18 MCG IN CAPS: 18.0000 ug | ORAL_CAPSULE | Freq: Every day | RESPIRATORY_TRACT | Status: DC

## 2014-04-12 NOTE — Telephone Encounter (Signed)
Pt aware RX has been called in. Nothing further needed 

## 2014-05-05 ENCOUNTER — Ambulatory Visit: Payer: BC Managed Care – PPO | Admitting: Emergency Medicine

## 2014-05-25 ENCOUNTER — Encounter (INDEPENDENT_AMBULATORY_CARE_PROVIDER_SITE_OTHER): Payer: Self-pay

## 2014-05-25 ENCOUNTER — Encounter: Payer: Self-pay | Admitting: Emergency Medicine

## 2014-05-25 ENCOUNTER — Ambulatory Visit (INDEPENDENT_AMBULATORY_CARE_PROVIDER_SITE_OTHER): Payer: BC Managed Care – PPO | Admitting: Emergency Medicine

## 2014-05-25 VITALS — BP 132/84 | HR 87 | Ht 63.0 in | Wt 143.4 lb

## 2014-05-25 DIAGNOSIS — J411 Mucopurulent chronic bronchitis: Secondary | ICD-10-CM

## 2014-05-25 DIAGNOSIS — Z23 Encounter for immunization: Secondary | ICD-10-CM

## 2014-05-25 NOTE — Addendum Note (Signed)
Addended by: Raymondo Band D on: 05/25/2014 03:37 PM   Modules accepted: Orders

## 2014-05-25 NOTE — Patient Instructions (Signed)
We agreed that you will be down to 18 cigarettes a day by our next visit Please continue your Spiriva daily Please use albuterol as needed for shortness of breath We will give the flu shot today Follow with Dr Lamonte Sakai in 1 month

## 2014-05-25 NOTE — Assessment & Plan Note (Signed)
Discussed smoking cessation!! spiriva + albuterol New nebulizer machine rov1

## 2014-05-25 NOTE — Progress Notes (Signed)
  Subjective:    Patient ID: Gloria Calderon, female    DOB: 1965-11-18, 48 y.o.   MRN: [id number]  HPI 48 yo woman, smoker 60 pk-yrs, hx of allergies. She is referred for COPD after a recent trip to ED. She averages an episode of bronchitis annually, was started on abx. She continued to cough and have trouble prompting trip to the [facility] ED on 10/03/13. She finished abx and prednisone. Her breathing has improved. Continues to have aches, fever, HA, now with N/V.    ROV 11/15/13 -- hx tobacco and new dx COPD. Last time we treated her for presumed influenza - she never took the tamiflu. Still smoking ~1pk/day.   ROV 01/05/14 -- follows up for tobacco and COPD. Smoking 1 pk a day. Started spiriva last time and she benefited. She needs albuterol by the end of the day. Coughing less. Was just treated for an AE with abx and pred by Dr Melina Copa.   ROV 05/25/14 -- follow up visit for COPD. She has had several flares, has received pred since last time. She continues to smoke 1 pk/day. She is using albuterol. Was treated in August for an AE in the Urgent Care.  She is using albuterol 4-5x a day, is on spiriva.    Review of Systems  Constitutional: Positive for appetite change. Negative for fever and unexpected weight change.  HENT: Positive for congestion, postnasal drip, sinus pressure and sore throat. Negative for dental problem, ear pain, nosebleeds, rhinorrhea, sneezing and trouble swallowing.   Eyes: Negative for redness and itching.  Respiratory: Positive for cough, chest tightness, shortness of breath and wheezing.   Cardiovascular: Negative for palpitations and leg swelling.  Gastrointestinal: Positive for abdominal pain. Negative for nausea and vomiting.  Genitourinary: Negative for dysuria.  Musculoskeletal: Negative for joint swelling.  Skin: Negative for rash.  Neurological: Positive for headaches.  Hematological: Does not bruise/bleed easily.  Psychiatric/Behavioral: Negative for dysphoric mood.  The patient is not nervous/anxious.        Objective:   Physical Exam Filed Vitals:   05/25/14 1507  BP: 132/84  Pulse: 87  Height: 5\' 3"  (1.6 m)  Weight: 143 lb 6.4 oz (65.046 kg)  SpO2: 97%   Gen: Pleasant, well-nourished, in no distress, ill appearing  ENT: No lesions,  mouth clear,  oropharynx clear, no postnasal drip  Neck: No JVD, no TMG, no carotid bruits  Lungs: No use of accessory muscles, B soft exp wheezes.   Cardiovascular: RRR, heart sounds normal, no murmur or gallops, no peripheral edema  Musculoskeletal: No deformities, no cyanosis or clubbing  Neuro: alert, non focal  Skin: Warm, no lesions or rashes      Assessment & Plan:  COPD (chronic obstructive pulmonary disease) Discussed smoking cessation!! spiriva + albuterol New nebulizer machine rov1

## 2014-05-26 ENCOUNTER — Telehealth: Payer: Self-pay | Admitting: Emergency Medicine

## 2014-05-26 NOTE — Telephone Encounter (Signed)
Spoke with pt.  She was seen yesterday by RB.  States she was needing to call back with Urgent Care information so we could obtain records and recent cxr report - pt went to [facility] in [place].  Was seen by Dr. Jomarie Longs.  Phone # [phone].   Called Urgent Care.  Spoke with [name].  Was advised would need to send fax on letterhead to [phone] for records.  This has been done.  Will await fax to triage.

## 2014-05-26 NOTE — Telephone Encounter (Signed)
Called spoke with patient to discuss the below with her.  Asked pt if she would be able to go to the Urgent Care to sign a release (she lives in [place]) at her convenience so that [place] can have her records.  Pt stated that this will not be an issue, but she is unable to do this until Monday 9/14.  Advised pt this will be fine; she will call the office once she has done so.  Called Urgent Care in [place], spoke with Mia to inform her that pt will come to their facility next week to sign a records release.  Mia voiced her understanding.  Will sign and forward to RB as FYI.

## 2014-05-26 NOTE — Telephone Encounter (Signed)
[name] w/ Urgent Care in [place] states that they are not able to release pt's records to [place] b/c the request does not have pt's signature on it.  Gloria Calderon

## 2014-06-05 ENCOUNTER — Telehealth: Payer: Self-pay | Admitting: Emergency Medicine

## 2014-06-05 NOTE — Telephone Encounter (Signed)
Thank you :)

## 2014-06-05 NOTE — Telephone Encounter (Signed)
Called spoke with pt. She wanted to ensure we received records we requested from PCP. This is in [place] look at. FYI

## 2014-06-12 ENCOUNTER — Telehealth: Payer: Self-pay | Admitting: Emergency Medicine

## 2014-06-12 NOTE — Telephone Encounter (Signed)
Rec'd from [place] and urgent Care forward 3 pages to [facility]

## 2014-06-13 ENCOUNTER — Encounter: Payer: Self-pay | Admitting: Emergency Medicine

## 2014-06-27 ENCOUNTER — Ambulatory Visit: Payer: BC Managed Care – PPO | Admitting: Emergency Medicine

## 2014-09-05 ENCOUNTER — Other Ambulatory Visit (HOSPITAL_COMMUNITY): Payer: Self-pay | Admitting: Sports Medicine

## 2014-09-05 DIAGNOSIS — M542 Cervicalgia: Secondary | ICD-10-CM

## 2014-09-13 ENCOUNTER — Ambulatory Visit (HOSPITAL_COMMUNITY): Payer: BC Managed Care – PPO

## 2014-09-18 ENCOUNTER — Ambulatory Visit (HOSPITAL_COMMUNITY): Payer: 59

## 2014-09-21 ENCOUNTER — Ambulatory Visit (HOSPITAL_COMMUNITY)
Admission: RE | Admit: 2014-09-21 | Discharge: 2014-09-21 | Disposition: A | Discharge status: Home / Self Care | Payer: 59 | Source: Ambulatory Visit | Attending: Sports Medicine | Admitting: Sports Medicine

## 2014-09-21 DIAGNOSIS — M79601 Pain in right arm: Secondary | ICD-10-CM | POA: Diagnosis not present

## 2014-09-21 DIAGNOSIS — M542 Cervicalgia: Secondary | ICD-10-CM | POA: Diagnosis present

## 2014-09-26 ENCOUNTER — Emergency Department (HOSPITAL_COMMUNITY): Payer: 59

## 2014-09-26 ENCOUNTER — Encounter (HOSPITAL_COMMUNITY): Payer: Self-pay | Admitting: *Deleted

## 2014-09-26 ENCOUNTER — Emergency Department (HOSPITAL_COMMUNITY)
Admission: EM | Admit: 2014-09-26 | Discharge: 2014-09-26 | Disposition: A | Discharge status: Home / Self Care | Payer: 59 | Attending: Emergency Medicine | Admitting: Emergency Medicine

## 2014-09-26 DIAGNOSIS — Y998 Other external cause status: Secondary | ICD-10-CM | POA: Insufficient documentation

## 2014-09-26 DIAGNOSIS — Z720 Tobacco use: Secondary | ICD-10-CM | POA: Diagnosis not present

## 2014-09-26 DIAGNOSIS — J449 Chronic obstructive pulmonary disease, unspecified: Secondary | ICD-10-CM | POA: Diagnosis not present

## 2014-09-26 DIAGNOSIS — S52591A Other fractures of lower end of right radius, initial encounter for closed fracture: Secondary | ICD-10-CM | POA: Insufficient documentation

## 2014-09-26 DIAGNOSIS — W06XXXA Fall from bed, initial encounter: Secondary | ICD-10-CM | POA: Diagnosis not present

## 2014-09-26 DIAGNOSIS — Y9389 Activity, other specified: Secondary | ICD-10-CM | POA: Insufficient documentation

## 2014-09-26 DIAGNOSIS — R52 Pain, unspecified: Secondary | ICD-10-CM

## 2014-09-26 DIAGNOSIS — Z7951 Long term (current) use of inhaled steroids: Secondary | ICD-10-CM | POA: Insufficient documentation

## 2014-09-26 DIAGNOSIS — S52501A Unspecified fracture of the lower end of right radius, initial encounter for closed fracture: Secondary | ICD-10-CM

## 2014-09-26 DIAGNOSIS — Z79899 Other long term (current) drug therapy: Secondary | ICD-10-CM | POA: Insufficient documentation

## 2014-09-26 DIAGNOSIS — Y9289 Other specified places as the place of occurrence of the external cause: Secondary | ICD-10-CM | POA: Insufficient documentation

## 2014-09-26 DIAGNOSIS — S6992XA Unspecified injury of left wrist, hand and finger(s), initial encounter: Secondary | ICD-10-CM | POA: Diagnosis present

## 2014-09-26 DIAGNOSIS — S52611A Displaced fracture of right ulna styloid process, initial encounter for closed fracture: Secondary | ICD-10-CM | POA: Diagnosis not present

## 2014-09-26 MED ORDER — LIDOCAINE HCL (PF) 2 % IJ SOLN
INTRAMUSCULAR | Status: AC
  Filled 2014-09-26: qty 10

## 2014-09-26 MED ORDER — HYDROMORPHONE HCL 2 MG/ML IJ SOLN
INTRAMUSCULAR | Status: AC
  Filled 2014-09-26: qty 1

## 2014-09-26 MED ORDER — HYDROCODONE-ACETAMINOPHEN 5-325 MG PO TABS: 1.0000 | ORAL_TABLET | Freq: Four times a day (QID) | ORAL | Status: DC | PRN

## 2014-09-26 MED ORDER — HYDROMORPHONE HCL 2 MG/ML IJ SOLN
2.0000 mg | Freq: Once | INTRAMUSCULAR | Status: AC
  Administered 2014-09-26: 2 mg via INTRAMUSCULAR

## 2014-09-26 MED ORDER — LIDOCAINE HCL (PF) 2 % IJ SOLN: 10.0000 mL | Freq: Once | INTRAMUSCULAR | Status: DC

## 2014-09-26 NOTE — Discharge Instructions (Signed)
Cast or Splint Care °Casts and splints support injured limbs and keep bones from moving while they heal. It is important to care for your cast or splint at home.   °HOME CARE INSTRUCTIONS °· Keep the cast or splint uncovered during the drying period. It can take 24 to 48 hours to dry if it is made of plaster. A fiberglass cast will dry in less than 1 hour. °· Do not rest the cast on anything harder than a pillow for the first 24 hours. °· Do not put weight on your injured limb or apply pressure to the cast until your health care provider gives you permission. °· Keep the cast or splint dry. Wet casts or splints can lose their shape and may not support the limb as well. A wet cast that has lost its shape can also create harmful pressure on your skin when it dries. Also, wet skin can become infected. °¨ Cover the cast or splint with a plastic bag when bathing or when out in the rain or snow. If the cast is on the trunk of the body, take sponge baths until the cast is removed. °¨ If your cast does become wet, dry it with a towel or a blow dryer on the cool setting only. °· Keep your cast or splint clean. Soiled casts may be wiped with a moistened cloth. °· Do not place any hard or soft foreign objects under your cast or splint, such as cotton, toilet paper, lotion, or powder. °· Do not try to scratch the skin under the cast with any object. The object could get stuck inside the cast. Also, scratching could lead to an infection. If itching is a problem, use a blow dryer on a cool setting to relieve discomfort. °· Do not trim or cut your cast or remove padding from inside of it. °· Exercise all joints next to the injury that are not immobilized by the cast or splint. For example, if you have a long leg cast, exercise the hip joint and toes. If you have an arm cast or splint, exercise the shoulder, elbow, thumb, and fingers. °· Elevate your injured arm or leg on 1 or 2 pillows for the first 1 to 3 days to decrease  swelling and pain. It is best if you can comfortably elevate your cast so it is higher than your heart. °SEEK MEDICAL CARE IF:  °· Your cast or splint cracks. °· Your cast or splint is too tight or too loose. °· You have unbearable itching inside the cast. °· Your cast becomes wet or develops a soft spot or area. °· You have a bad smell coming from inside your cast. °· You get an object stuck under your cast. °· Your skin around the cast becomes red or raw. °· You have new pain or worsening pain after the cast has been applied. °SEEK IMMEDIATE MEDICAL CARE IF:  °· You have fluid leaking through the cast. °· You are unable to move your fingers or toes. °· You have discolored (blue or white), cool, painful, or very swollen fingers or toes beyond the cast. °· You have tingling or numbness around the injured area. °· You have severe pain or pressure under the cast. °· You have any difficulty with your breathing or have shortness of breath. °· You have chest pain. °Document Released: 08/29/2000 Document Revised: 06/22/2013 Document Reviewed: 03/10/2013 °ExitCare® Patient Information ©2015 [employer]. This information is not intended to replace advice given to you by your health care   provider. Make sure you discuss any questions you have with your health care provider. Radial Fracture You have a broken bone (fracture) of the forearm. This is the part of your arm between the elbow and your wrist. Your forearm is made up of two bones. These are the radius and ulna. Your fracture is in the radial shaft. This is the bone in your forearm located on the thumb side. A cast or splint is used to protect and keep your injured bone from moving. The cast or splint will be on generally for about 5 to 6 weeks, with individual variations. HOME CARE INSTRUCTIONS   Keep the injured part elevated while sitting or lying down. Keep the injury above the level of your heart (the center of the chest). This will decrease swelling and  pain.  Apply ice to the injury for 15-20 minutes, 03-04 times per day while awake, for 2 days. Put the ice in a plastic bag and place a towel between the bag of ice and your cast or splint.  Move your fingers to avoid stiffness and minimize swelling.  If you have a plaster or fiberglass cast:  Do not try to scratch the skin under the cast using sharp or pointed objects.  Check the skin around the cast every day. You may put lotion on any red or sore areas.  Keep your cast dry and clean.  If you have a plaster splint:  Wear the splint as directed.  You may loosen the elastic around the splint if your fingers become numb, tingle, or turn cold or blue.  Do not put pressure on any part of your cast or splint. It may break. Rest your cast only on a pillow for the first 24 hours until it is fully hardened.  Your cast or splint can be protected during bathing with a plastic bag. Do not lower the cast or splint into water.  Only take over-the-counter or prescription medicines for pain, discomfort, or fever as directed by your caregiver. SEEK IMMEDIATE MEDICAL CARE IF:   Your cast gets damaged or breaks.  You have more severe pain or swelling than you did before getting the cast.  You have severe pain when stretching your fingers.  There is a bad smell, new stains and/or pus-like (purulent) drainage coming from under the cast.  Your fingers or hand turn pale or blue and become cold or your loose feeling. Document Released: 02/12/2006 Document Revised: 11/24/2011 Document Reviewed: 05/11/2006 [facility] Patient Information 2015 [place], [place]. This information is not intended to replace advice given to you by your health care provider. Make sure you discuss any questions you have with your health care provider.

## 2014-09-26 NOTE — ED Notes (Signed)
Patient states she was lying in bed and fell. She landed on her wrist and is in extreme pain

## 2014-09-26 NOTE — ED Provider Notes (Signed)
CSN: [id number]     Arrival date & time 09/26/14  0227 History   First MD Initiated Contact with Patient 09/26/14 0242     Chief Complaint  Patient presents with  . Fall  . Wrist Pain    FELL OUT OF BED AND LANDED ON HER RIGHT WRIST     HPI Patient states she was lying in bed when she fell out and injured her right wrist.  Presents with obvious deformity to her right wrist.  No head injury.  Denies neck pain.  No chest pain shortness of breath.  She does mention drinking alcohol this evening.  She reports moderate to severe pain in her right wrist that is worse with manipulation and palpation of her right wrist.  No numbness or tingling.   Past Medical History  Diagnosis Date  . COPD (chronic obstructive pulmonary disease)    Past Surgical History  Procedure Laterality Date  . Cholecystectomy     History reviewed. No pertinent family history. History  Substance Use Topics  . Smoking status: Current Every Day Smoker -- 1.00 packs/day for 30 years    Types: Cigarettes  . Smokeless tobacco: Not on file  . Alcohol Use: No   OB History    No data available     Review of Systems  All other systems reviewed and are negative.     Allergies  Morphine and related  Home Medications   Prior to Admission medications   Medication Sig Start Date End Date Taking? Authorizing Provider  albuterol (PROVENTIL HFA;VENTOLIN HFA) 108 (90 BASE) MCG/ACT inhaler Inhale 2 puffs into the lungs every 6 (six) hours as needed for wheezing or shortness of breath.   Yes Historical Provider, MD  albuterol (PROVENTIL) (2.5 MG/3ML) 0.083% nebulizer solution Take 3 mLs (2.5 mg total) by nebulization every 6 (six) hours as needed for wheezing or shortness of breath. 10/03/13  Yes Virgel Manifold, MD  ALPRAZolam Duanne Moron) 1 MG tablet Take 2 tablets by mouth 3 (three) times daily as needed.   Yes Historical Provider, MD  bisacodyl (DULCOLAX) 5 MG EC tablet Take 15 mg by mouth daily.    Yes Historical Provider,  MD  cyclobenzaprine (FLEXERIL) 10 MG tablet Take 1 tablet by mouth as needed.   Yes Historical Provider, MD  DULoxetine (CYMBALTA) 30 MG capsule Take 30 mg by mouth 2 (two) times daily.   Yes Historical Provider, MD  ENDOCET 10-325 MG per tablet 1 tablet 2 (two) times daily as needed.  09/24/13  Yes Historical Provider, MD  esomeprazole (NEXIUM) 20 MG capsule Take 40 mg by mouth daily at 12 noon.    Yes Historical Provider, MD  fluticasone (FLONASE) 50 MCG/ACT nasal spray Place 1 spray into both nostrils daily.   Yes Historical Provider, MD  Multiple Vitamins-Minerals (MULTIVITAMIN & MINERAL PO) Take 1 tablet by mouth daily.   Yes Historical Provider, MD  oxymetazoline (AFRIN) 0.05 % nasal spray Place 1 spray into both nostrils 2 (two) times daily.   Yes Historical Provider, MD  pregabalin (LYRICA) 50 MG capsule Take 50 mg by mouth daily.   Yes Historical Provider, MD  Probiotic Product (META BIOTIC/BIO-ACTIVE 12 PO) Take 1 tablet by mouth daily.   Yes Historical Provider, MD  tiotropium (SPIRIVA) 18 MCG inhalation capsule Place 1 capsule (18 mcg total) into inhaler and inhale daily. 04/12/14  Yes Collene Gobble, MD  HYDROcodone-acetaminophen (NORCO/VICODIN) 5-325 MG per tablet Take 1 tablet by mouth every 6 (six) hours as needed for  moderate pain. 09/26/14   Hoy Morn, MD   BP 103/88 mmHg  Pulse 95  Temp(Src) 97.9 F (36.6 C) (Oral)  Resp 16  Ht 5\' 3"  (1.6 m)  Wt 140 lb (63.504 kg)  BMI 24.81 kg/m2  SpO2 88% Physical Exam  Constitutional: She is oriented to person, place, and time. She appears well-developed and well-nourished.  HENT:  Head: Normocephalic.  Eyes: EOM are normal.  Neck: Normal range of motion.  Pulmonary/Chest: Effort normal.  Abdominal: She exhibits no distension.  Musculoskeletal: Normal range of motion.  Obvious deformity of the right wrist consistent with a distal radius fracture.  This is a closed fracture.  There are no open components.  Normal right radial  pulse.  Normal range of motion of the right fingers.  No pain with range of motion of the right wrist or right shoulder.  Neurological: She is alert and oriented to person, place, and time.  Psychiatric: She has a normal mood and affect.  Nursing note and vitals reviewed.   ED Course  Procedures (including critical care time)  Reduction of FRACTURE Performed by: Hoy Morn Consent: Verbal consent obtained. Risks and benefits: risks, benefits and alternatives were discussed Consent given by: patient Required items: required blood products, implants, devices, and special equipment available Time out: Immediately prior to procedure a "time out" was called to verify the correct patient, procedure, equipment, support staff and site/side marked as required. Patient sedated: no HEMATOMA BLOCK: 7 cc of 2% lidocaine without epinephrine (standard sterile technique utilized-betadine) Vitals: Vital signs were monitored during sedation. Patient tolerance: Patient tolerated the procedure well with no immediate complications. BONE: right distal radius Reduction technique: manipulation  SPLINT APPLICATION Authorized by: Hoy Morn Consent: Verbal consent obtained. Risks and benefits: risks, benefits and alternatives were discussed Consent given by: patient Splint applied by: tech and myself Location details: right short arm Splint type: sugar tong Supplies used: orthoglass Post-procedure: The splinted body part was neurovascularly unchanged following the procedure. Patient tolerance: Patient tolerated the procedure well with no immediate complications.       Labs Review Labs Reviewed - No data to display  Imaging Review Dg Wrist Complete Right  09/26/2014   CLINICAL DATA:  Status post reduction of right distal radius fracture. Subsequent encounter.  EXAM: RIGHT WRIST - COMPLETE 3+ VIEW  COMPARISON:  Right wrist radiograph performed earlier today at 2:39 a.m.  FINDINGS: There is  improved alignment of the distal radius fracture, seen in grossly anatomic alignment. A minimally displaced ulnar styloid fracture is again seen. No new fractures are identified.  The carpal rows appear grossly intact, and demonstrate normal alignment. The soft tissues are difficult to fully assess due to the overlying cast.  IMPRESSION: Distal radius fracture noted in grossly anatomic alignment. Minimally displaced ulnar styloid fracture again seen.   Electronically Signed   By: Garald Balding M.D.   On: 09/26/2014 04:24   Dg Wrist Complete Right  09/26/2014   CLINICAL DATA:  Fall out of bed and outstretched hand. Right wrist pain and deformity.  EXAM: RIGHT WRIST - COMPLETE 3+ VIEW  COMPARISON:  None.  FINDINGS: Transverse impaction fracture distal radial metaphysis with mild dorsal displacement and apex volar angulation. There is involvement of the distal radial ulnar joint. No radiocarpal extension. Displaced ulna styloid fractures seen. There is soft tissue edema about the wrist.  IMPRESSION: Displaced angulated distal radial metaphysis fracture extending to the distal radial ulnar joint. Displaced ulnar styloid fracture.   Electronically Signed  By: Jeb Levering M.D.   On: 09/26/2014 03:02  I personally reviewed the imaging tests through PACS system I reviewed available ER/hospitalization records through the EMR    EKG Interpretation None      MDM   Final diagnoses:  Distal radius fracture, right, closed, initial encounter  Fracture of right ulnar styloid, closed, initial encounter    Mechanical fall.  Distal right radius fracture.  Closed fracture.  Reduced at the bedside with a hematoma block.    Hoy Morn, MD 09/26/14 [phone]

## 2014-09-26 NOTE — ED Notes (Signed)
Patient wearing splint placed via ems

## 2014-09-27 ENCOUNTER — Encounter (HOSPITAL_COMMUNITY): Payer: Self-pay | Admitting: Emergency Medicine

## 2014-09-27 ENCOUNTER — Emergency Department (HOSPITAL_COMMUNITY)
Admission: EM | Admit: 2014-09-27 | Discharge: 2014-09-27 | Disposition: A | Discharge status: Home / Self Care | Payer: 59 | Attending: Emergency Medicine | Admitting: Emergency Medicine

## 2014-09-27 DIAGNOSIS — Z720 Tobacco use: Secondary | ICD-10-CM | POA: Insufficient documentation

## 2014-09-27 DIAGNOSIS — Z79899 Other long term (current) drug therapy: Secondary | ICD-10-CM | POA: Insufficient documentation

## 2014-09-27 DIAGNOSIS — J449 Chronic obstructive pulmonary disease, unspecified: Secondary | ICD-10-CM | POA: Diagnosis not present

## 2014-09-27 DIAGNOSIS — Z7951 Long term (current) use of inhaled steroids: Secondary | ICD-10-CM | POA: Diagnosis not present

## 2014-09-27 DIAGNOSIS — Z4789 Encounter for other orthopedic aftercare: Secondary | ICD-10-CM | POA: Diagnosis present

## 2014-09-27 NOTE — ED Notes (Signed)
PT c/o cast to RUE to tight and some loss in sensation to right fingers. PT reports ortho appt for 10/09/14. Good capillary refill noted to finger tips.

## 2014-09-27 NOTE — ED Provider Notes (Signed)
CSN: [id number]     Arrival date & time 09/27/14  1159 History   First MD Initiated Contact with Patient 09/27/14 1214     Chief Complaint  Patient presents with  . Cast Problem     (Consider location/radiation/quality/duration/timing/severity/associated sxs/prior Treatment) HPI Comments: Pt comes in with c/o  Pain associated with her cast. Pt states that the cast was placed here yesterday for a broken. Pt states that the cast is hurting on the pinky finger and the elbow  The history is provided by the patient. No language interpreter was used.    Past Medical History  Diagnosis Date  . COPD (chronic obstructive pulmonary disease)    Past Surgical History  Procedure Laterality Date  . Cholecystectomy    . Appendectomy     No family history on file. History  Substance Use Topics  . Smoking status: Current Every Day Smoker -- 1.00 packs/day for 30 years    Types: Cigarettes  . Smokeless tobacco: Not on file  . Alcohol Use: No   OB History    Gravida Para Term Preterm AB TAB SAB Ectopic Multiple Living            1     Review of Systems  All other systems reviewed and are negative.     Allergies  Morphine and related  Home Medications   Prior to Admission medications   Medication Sig Start Date End Date Taking? Authorizing Provider  albuterol (PROVENTIL HFA;VENTOLIN HFA) 108 (90 BASE) MCG/ACT inhaler Inhale 2 puffs into the lungs every 6 (six) hours as needed for wheezing or shortness of breath.   Yes Historical Provider, MD  albuterol (PROVENTIL) (2.5 MG/3ML) 0.083% nebulizer solution Take 3 mLs (2.5 mg total) by nebulization every 6 (six) hours as needed for wheezing or shortness of breath. 10/03/13  Yes Virgel Manifold, MD  ALPRAZolam Duanne Moron) 1 MG tablet Take 2 tablets by mouth 3 (three) times daily as needed for anxiety.    Yes Historical Provider, MD  bisacodyl (DULCOLAX) 5 MG EC tablet Take 15 mg by mouth daily.    Yes Historical Provider, MD  cyclobenzaprine  (FLEXERIL) 10 MG tablet Take 1 tablet by mouth as needed for muscle spasms.    Yes Historical Provider, MD  DULoxetine (CYMBALTA) 30 MG capsule Take 30 mg by mouth 2 (two) times daily.   Yes Historical Provider, MD  ENDOCET 10-325 MG per tablet 1 tablet 2 (two) times daily as needed.  09/24/13  Yes Historical Provider, MD  esomeprazole (NEXIUM) 20 MG capsule Take 40 mg by mouth daily at 12 noon.    Yes Historical Provider, MD  fluticasone (FLONASE) 50 MCG/ACT nasal spray Place 1 spray into both nostrils daily.   Yes Historical Provider, MD  HYDROcodone-acetaminophen (NORCO/VICODIN) 5-325 MG per tablet Take 1 tablet by mouth every 6 (six) hours as needed for moderate pain. 09/26/14  Yes Hoy Morn, MD  Multiple Vitamins-Minerals (MULTIVITAMIN & MINERAL PO) Take 1 tablet by mouth daily.   Yes Historical Provider, MD  oxymetazoline (AFRIN) 0.05 % nasal spray Place 1 spray into both nostrils 2 (two) times daily.   Yes Historical Provider, MD  pregabalin (LYRICA) 50 MG capsule Take 50 mg by mouth daily.   Yes Historical Provider, MD  Probiotic Product (META BIOTIC/BIO-ACTIVE 12 PO) Take 1 tablet by mouth daily.   Yes Historical Provider, MD  tiotropium (SPIRIVA) 18 MCG inhalation capsule Place 1 capsule (18 mcg total) into inhaler and inhale daily. 04/12/14  Yes  Collene Gobble, MD   BP 133/78 mmHg  Pulse 95  Temp(Src) 98.9 F (37.2 C) (Oral)  Resp 18  Ht 5\' 3"  (1.6 m)  Wt 140 lb (63.504 kg)  BMI 24.81 kg/m2  SpO2 97% Physical Exam  Constitutional: She appears well-nourished.  Cardiovascular: Normal rate and regular rhythm.   Pulmonary/Chest: Effort normal and breath sounds normal.  Musculoskeletal:  Full rom of finger. Good sensation.   Nursing note and vitals reviewed.   ED Course  Procedures (including critical care time) Labs Review Labs Reviewed - No data to display  Imaging Review Dg Wrist Complete Right  09/26/2014   CLINICAL DATA:  Status post reduction of right distal radius  fracture. Subsequent encounter.  EXAM: RIGHT WRIST - COMPLETE 3+ VIEW  COMPARISON:  Right wrist radiograph performed earlier today at 2:39 a.m.  FINDINGS: There is improved alignment of the distal radius fracture, seen in grossly anatomic alignment. A minimally displaced ulnar styloid fracture is again seen. No new fractures are identified.  The carpal rows appear grossly intact, and demonstrate normal alignment. The soft tissues are difficult to fully assess due to the overlying cast.  IMPRESSION: Distal radius fracture noted in grossly anatomic alignment. Minimally displaced ulnar styloid fracture again seen.   Electronically Signed   By: Garald Balding M.D.   On: 09/26/2014 04:24   Dg Wrist Complete Right  09/26/2014   CLINICAL DATA:  Fall out of bed and outstretched hand. Right wrist pain and deformity.  EXAM: RIGHT WRIST - COMPLETE 3+ VIEW  COMPARISON:  None.  FINDINGS: Transverse impaction fracture distal radial metaphysis with mild dorsal displacement and apex volar angulation. There is involvement of the distal radial ulnar joint. No radiocarpal extension. Displaced ulna styloid fractures seen. There is soft tissue edema about the wrist.  IMPRESSION: Displaced angulated distal radial metaphysis fracture extending to the distal radial ulnar joint. Displaced ulnar styloid fracture.   Electronically Signed   By: Jeb Levering M.D.   On: 09/26/2014 03:02     EKG Interpretation None      MDM   Final diagnoses:  Cast discomfort    Pt placed in cast and is more comfortable at this time    Glendell Docker, NP 09/27/14 [place], MD 09/27/14 1549

## 2014-09-27 NOTE — Discharge Instructions (Signed)
Cast Care The purpose of a cast is to protect and immobilize an injured part of the body. This may be necessary after fractures, surgery, or other injuries. Splints are another form of immobilization; however, they are usually not as rigid as a cast, which accommodates swelling of the injury while still maintaining immobilization. Splints are typically used in the immediate post injury or postoperative period, before changing to a cast.  Before the rigid material of a cast is formed, a layer of padding is first applied to protect the injury. The rigid portion of a cast is created by wrapping gauze saturated with plaster of paris around the injury; alternatively, the cast may be made of fiberglass. During the period of immobilization, a cast may need to be changed multiple times. The length of immobilization is dependent on the severity of the injury and the time needed for healing. This period can vary from a couple weeks to many months. After a cast is created, radiographs (X-rays) through the cast determine if a satisfactory alignment of the bones was achieved. Radiographs may also be used throughout the healing period to check for signs of bone healing.  CARE OF THE CAST   Allow the cast to dry and harden completely before applying any pressure to it.  Applying pressure too early may create a point of excessive pressure on the skin, which may increase the risk of forming an ulcer. Drying time depends on the type of cast but may last as long as 24 hours.  When a cast gets wet, a soft area may appear. If this happens accidentally, return to the doctor's office, emergency room, or outpatient surgical facility as soon as possible for repairs or changing of the cast.  Do not get sand in the cast.  Do not place anything inside the cast. This includes items intended to scratch an area of skin that itches. ITCHING INSIDE A CAST   It is very common for a person with a cast to experience an itching  sensation under the cast.  Do not scratch any area under the cast even if the area is within reach. The skin under a cast in an environment of increased risk for injury.  Do not put anything in the cast.  If there is no wound, such as an incision from surgery, you may sprinkle cornstarch into the cast to relieve itching.  If a wound is present under the cast, consult your caregiver for pain medication or medication to reduce itching.  Using a hairdryer (on the cold setting) is a useful technique for reducing an itching sensation. CARE OF THE PATIENT IN A CAST It is important to elevate the body part that is in a cast to a level equal to or above that of the heart whenever possible. Elevating the injured body part may reduce the likelihood of swelling. Elevation of a leg in a cast may be achieved by resting the leg on a pillow when in bed and on a footstool or chair when sitting. For an arm in a cast, rest the arm on a pillow placed on the chest.  No matter how well one follows the necessary precautions, excessive swelling may occur under the cast. Signs and symptoms of excessive swelling include:  Severe and persistent pain.  Change in color of the tissues beyond the end of the cast, such as a change to blue or gray under the nails of the fingers or toes.  Coldness of the tissues beyond the cast when  the rest of the body is warm.  Numbness or complete loss of feeling in the skin beyond the cast.  Feeling of tightness under the cast after it dries.  Swelling of the tissue to a greater extent than was present before the cast was applied.  For a leg cast, inability to raise the big toe. If any of these signs or symptoms occur, contact your caregiver or an emergency room as soon as possible for treatment.  Infection Inside a Cast On occasion, an injury may become infected during healing. The most important way to fight an infection is to detect it early; however, early detection may be  difficult if the infected area is covered by a cast. Infection should be reported immediately to your caregiver. The following are common signs and symptoms of infection:   Foul smell.  Fever greater than 101F (38.3C) (may be accompanied by a general ill feeling).  Leakage of fluid through the cast.  Increasing pain or soreness of the skin under the cast. Bathing with a Cast Bathing is often a difficult task with a cast. The cast must be kept dry at all times, unless otherwise specified by your caregiver. If the cast is on a limb, such as your arm or leg, it is often easier to take a bath with the extremity in a cast propped up on the side of the tub or a chair, out of the water. If the cast is on the trunk of the body, you should take sponge baths until the cast is removed.  Document Released: 09/01/2005 Document Revised: 01/16/2014 Document Reviewed: 12/14/2008 Exit[employer] Patient Information 2015 [employer]. This information is not intended to replace advice given to you by your health care provider. Make sure you discuss any questions you have with your health care provider.  

## 2014-09-28 ENCOUNTER — Ambulatory Visit: Payer: BC Managed Care – PPO | Admitting: Emergency Medicine

## 2015-01-10 ENCOUNTER — Telehealth: Payer: Self-pay | Admitting: Emergency Medicine

## 2015-01-10 MED ORDER — NYSTATIN 100000 UNIT/ML MT SUSP: OROMUCOSAL | Status: DC

## 2015-01-10 NOTE — Telephone Encounter (Signed)
Called and spoke to pt. Pt confirmed it was nystatin RB prescribed and it was covered by insurance.   RB please advise.

## 2015-01-10 NOTE — Telephone Encounter (Signed)
Rx for Nystatin has been sent in. Pt is aware. She will contact her PCP if her symptoms don't resolve.

## 2015-01-10 NOTE — Telephone Encounter (Signed)
Name of oral rinse previously given by RB is nystatin, [phone]

## 2015-01-10 NOTE — Telephone Encounter (Signed)
Called and spoke to pt. Pt stated she was prescribed Biaxin for bronchitis from PCP, pt now c/o her tongue being "raw". Pt's PCP prescribed a medication to help with the mouth discomfort but pt is unsure of the name. Pt stated this medication was too expensive for her and could not pick it up. Pt's PCP stated she did not know what other medication to prescribe that will help with the mouth discomfort and to contact her pulmonologist. Advised pt to call to pharmacy to see what her PCP prescribed and call back. Will await call.

## 2015-01-10 NOTE — Telephone Encounter (Signed)
Difficult for me to know what will help her as I have not seen her.  If she and PCP believe she has thrush then Nystatin S&S would be appropriate tid x 3 days; alternative would be fluconazole 100mg  po qd x 3 days.  Robinette with me to order either of these, but if sx continue she needs to address with her PCP to insure correct dx

## 2015-01-31 ENCOUNTER — Telehealth: Payer: Self-pay | Admitting: Emergency Medicine

## 2015-01-31 MED ORDER — ALBUTEROL SULFATE (2.5 MG/3ML) 0.083% IN NEBU: 2.5000 mg | INHALATION_SOLUTION | Freq: Four times a day (QID) | RESPIRATORY_TRACT | Status: DC | PRN

## 2015-01-31 NOTE — Telephone Encounter (Signed)
Rx has been sent in. Pt is aware. Nothing further was needed. 

## 2015-02-05 ENCOUNTER — Ambulatory Visit (INDEPENDENT_AMBULATORY_CARE_PROVIDER_SITE_OTHER): Payer: 59 | Admitting: Emergency Medicine

## 2015-02-05 ENCOUNTER — Encounter: Payer: Self-pay | Admitting: *Deleted

## 2015-02-05 ENCOUNTER — Encounter: Payer: Self-pay | Admitting: Emergency Medicine

## 2015-02-05 VITALS — BP 110/70 | HR 102 | Wt 141.0 lb

## 2015-02-05 DIAGNOSIS — B370 Candidal stomatitis: Secondary | ICD-10-CM

## 2015-02-05 DIAGNOSIS — J411 Mucopurulent chronic bronchitis: Secondary | ICD-10-CM

## 2015-02-05 MED ORDER — FLUCONAZOLE 100 MG PO TABS: 100.0000 mg | ORAL_TABLET | Freq: Every day | ORAL | Status: DC

## 2015-02-05 NOTE — Assessment & Plan Note (Signed)
Marginal control with significant symptom burden and exacerbations because she continues to smoke. Discussed importance of cessation. We will continue Spiriva and albuterol when necessary. Discussed albuterol usage and how to avoid using more frequently than every 4 hours. We will need to work hard on smoking cessation as well as forward. A letter will be prepared for her to present regarding her work, leave and disability. This will state that she is seen here for COPD, chronic bronchitis, tobacco use.

## 2015-02-05 NOTE — Patient Instructions (Signed)
Please continue Spiriva once a day Use your albuterol 2 puffs up to every 4 hours if needed for shortness of breath Take fluconazole 100mg  daily for 3 days.  We will prepare a letter stating that we are following you and treating you for COPD, tobacco use and chronic bronchitis.  Please continue to work on decreasing your cigarettes.  Follow with Dr Lamonte Sakai in 3 months or sooner if you have any problems.

## 2015-02-05 NOTE — Progress Notes (Signed)
Subjective:    Patient ID: Gloria Calderon, female    DOB: [birth year], 49 y.o.   MRN: [id number]  HPI 49 yo woman, smoker 60 pk-yrs, hx of allergies. She is referred for COPD after a recent trip to ED. She averages an episode of bronchitis annually, was started on abx. She continued to cough and have trouble prompting trip to the [facility] ED on 10/03/13. She finished abx and prednisone. Her breathing has improved. Continues to have aches, fever, HA, now with N/V.    ROV 11/15/13 -- hx tobacco and new dx COPD. Last time we treated her for presumed influenza - she never took the tamiflu. Still smoking ~1pk/day.   ROV 01/05/14 -- follows up for tobacco and COPD. Smoking 1 pk a day. Started spiriva last time and she benefited. She needs albuterol by the end of the day. Coughing less. Was just treated for an AE with abx and pred by Dr Melina Copa.   ROV 05/25/14 -- follow up visit for COPD. She has had several flares, has received pred since last time. She continues to smoke 1 pk/day. She is using albuterol. Was treated in August for an AE in the Urgent Care.  She is using albuterol 4-5x a day, is on spiriva.   ROV 02/05/15 -- history of active smoking, associated COPD that has been difficult to control with frequent episodes of bronchitis and exacerbations. FEV1 1.48 L (53% pred) in 4/'15.  She continues to smoke, has had ups / downs. She had a flare in April for which she received clarithro + cough syrup from PCP. Course c/b suspected thrush for which we treated.  Since then tongue rough and throat sore, raw, hoarse voice. Some congestion.    Review of Systems  Constitutional: Negative for fever, appetite change and unexpected weight change.  HENT: Positive for congestion and postnasal drip. Negative for dental problem, ear pain, nosebleeds, rhinorrhea, sinus pressure, sneezing, sore throat and trouble swallowing.   Eyes: Negative for redness and itching.  Respiratory: Positive for cough, chest tightness,  shortness of breath and wheezing.   Cardiovascular: Negative for palpitations and leg swelling.  Gastrointestinal: Negative for nausea, vomiting and abdominal pain.  Genitourinary: Negative for dysuria.  Musculoskeletal: Negative for joint swelling.  Skin: Negative for rash.  Neurological: Negative for headaches.  Hematological: Does not bruise/bleed easily.  Psychiatric/Behavioral: Negative for dysphoric mood. The patient is not nervous/anxious.        Objective:   Physical Exam Filed Vitals:   02/05/15 1536  BP: 110/70  Pulse: 102  Weight: 141 lb (63.957 kg)  SpO2: 97%   Gen: Pleasant, well-nourished, in no distress, ill appearing  ENT: No lesions,  mouth clear,  oropharynx clear, no postnasal drip , tongue has a mild brown coating, no other signs thrush in the post pharynx.   Neck: No JVD, no TMG, no carotid bruits  Lungs: No use of accessory muscles, B soft exp wheezes, louder on the R.   Cardiovascular: RRR, heart sounds normal, no murmur or gallops, no peripheral edema  Musculoskeletal: No deformities, no cyanosis or clubbing  Neuro: alert, non focal  Skin: Warm, no lesions or rashes      Assessment & Plan:  Thrush She has some brown coating on her tongue although was not clearly this is thrush I do not see any posterior pharyngeal plaquing but she complains of sore throat. Like to treat her for 3 days with fluconazole since she did not get any relief from nystatin  COPD (chronic obstructive pulmonary disease) Marginal control with significant symptom burden and exacerbations because she continues to smoke. Discussed importance of cessation. We will continue Spiriva and albuterol when necessary. Discussed albuterol usage and how to avoid using more frequently than every 4 hours. We will need to work hard on smoking cessation as well as forward. A letter will be prepared for her to present regarding her work, leave and disability. This will state that she is seen  here for COPD, chronic bronchitis, tobacco use.

## 2015-02-05 NOTE — Assessment & Plan Note (Signed)
She has some brown coating on her tongue although was not clearly this is thrush I do not see any posterior pharyngeal plaquing but she complains of sore throat. Like to treat her for 3 days with fluconazole since she did not get any relief from nystatin

## 2015-02-05 NOTE — Addendum Note (Signed)
Addended by: Desmond Dike C on: 02/05/2015 04:20 PM   Modules accepted: Orders

## 2015-02-08 ENCOUNTER — Telehealth: Payer: Self-pay | Admitting: Emergency Medicine

## 2015-02-08 NOTE — Telephone Encounter (Signed)
Spoke with pt. She reports the letter RB did for her is not sufficient and needs forms she has filled out. Made her aware these needs to be taken to our [facility] downstairs. Nothing further needed

## 2015-03-06 ENCOUNTER — Telehealth: Payer: Self-pay | Admitting: Emergency Medicine

## 2015-03-06 NOTE — Telephone Encounter (Signed)
Form was given to me. It has been put in RB's sign folder.

## 2015-03-06 NOTE — Telephone Encounter (Signed)
I looked upfront and did not see form. Ria Comment, do you have anything on this pt? thanks

## 2015-03-06 NOTE — Telephone Encounter (Signed)
RB has completed these forms. I will contact pt in the morning to let her know they are ready.

## 2015-03-07 NOTE — Telephone Encounter (Addendum)
lmtcb x1 The original of this form and a copy of the front and back have been placed up front for the pt to pick up.

## 2015-03-07 NOTE — Telephone Encounter (Signed)
Spoke with pt, aware that forms are up front for pickup.  Nothing further needed.

## 2015-03-07 NOTE — Telephone Encounter (Signed)
Patient returned call, can be reached at [phone].

## 2015-04-14 ENCOUNTER — Other Ambulatory Visit: Payer: Self-pay | Admitting: Emergency Medicine

## 2015-04-18 ENCOUNTER — Other Ambulatory Visit (HOSPITAL_COMMUNITY): Payer: Self-pay | Admitting: Physical Medicine and Rehabilitation

## 2015-04-18 DIAGNOSIS — M5440 Lumbago with sciatica, unspecified side: Secondary | ICD-10-CM

## 2015-04-27 ENCOUNTER — Ambulatory Visit (HOSPITAL_COMMUNITY)
Admission: RE | Admit: 2015-04-27 | Discharge: 2015-04-27 | Disposition: A | Discharge status: Home / Self Care | Payer: 59 | Source: Ambulatory Visit | Attending: Physical Medicine and Rehabilitation | Admitting: Physical Medicine and Rehabilitation

## 2015-04-27 DIAGNOSIS — M545 Low back pain: Secondary | ICD-10-CM | POA: Insufficient documentation

## 2015-04-27 DIAGNOSIS — M4317 Spondylolisthesis, lumbosacral region: Secondary | ICD-10-CM | POA: Insufficient documentation

## 2015-04-27 DIAGNOSIS — M4806 Spinal stenosis, lumbar region: Secondary | ICD-10-CM | POA: Diagnosis not present

## 2015-04-27 DIAGNOSIS — M5440 Lumbago with sciatica, unspecified side: Secondary | ICD-10-CM

## 2015-07-29 ENCOUNTER — Emergency Department (HOSPITAL_COMMUNITY): Payer: 59

## 2015-07-29 ENCOUNTER — Encounter (HOSPITAL_COMMUNITY): Payer: Self-pay | Admitting: Emergency Medicine

## 2015-07-29 ENCOUNTER — Inpatient Hospital Stay (HOSPITAL_COMMUNITY)
Admission: EM | Admit: 2015-07-29 | Discharge: 2015-08-01 | DRG: 190 | Disposition: A | Discharge status: Home / Self Care | Payer: 59 | Attending: Internal Medicine | Admitting: Internal Medicine

## 2015-07-29 DIAGNOSIS — J438 Other emphysema: Secondary | ICD-10-CM

## 2015-07-29 DIAGNOSIS — T7840XA Allergy, unspecified, initial encounter: Secondary | ICD-10-CM

## 2015-07-29 DIAGNOSIS — Z720 Tobacco use: Secondary | ICD-10-CM | POA: Diagnosis not present

## 2015-07-29 DIAGNOSIS — R21 Rash and other nonspecific skin eruption: Secondary | ICD-10-CM | POA: Diagnosis present

## 2015-07-29 DIAGNOSIS — J441 Chronic obstructive pulmonary disease with (acute) exacerbation: Secondary | ICD-10-CM | POA: Diagnosis present

## 2015-07-29 DIAGNOSIS — J449 Chronic obstructive pulmonary disease, unspecified: Secondary | ICD-10-CM | POA: Diagnosis present

## 2015-07-29 DIAGNOSIS — J9601 Acute respiratory failure with hypoxia: Secondary | ICD-10-CM | POA: Diagnosis present

## 2015-07-29 DIAGNOSIS — D751 Secondary polycythemia: Secondary | ICD-10-CM | POA: Diagnosis present

## 2015-07-29 LAB — CBC WITH DIFFERENTIAL/PLATELET
Basophils Absolute: 0 10*3/uL (ref 0.0–0.1)
Basophils Relative: 0 %
EOS ABS: 0 10*3/uL (ref 0.0–0.7)
Eosinophils Relative: 0 %
HCT: 51.6 % — ABNORMAL HIGH (ref 36.0–46.0)
Hemoglobin: 17 g/dL — ABNORMAL HIGH (ref 12.0–15.0)
LYMPHS ABS: 2.8 10*3/uL (ref 0.7–4.0)
LYMPHS PCT: 21 %
MCH: 33.3 pg (ref 26.0–34.0)
MCHC: 32.9 g/dL (ref 30.0–36.0)
MCV: 101.2 fL — AB (ref 78.0–100.0)
MONO ABS: 1.5 10*3/uL — AB (ref 0.1–1.0)
MONOS PCT: 11 %
Neutro Abs: 8.8 10*3/uL — ABNORMAL HIGH (ref 1.7–7.7)
Neutrophils Relative %: 68 %
PLATELETS: 254 10*3/uL (ref 150–400)
RBC: 5.1 MIL/uL (ref 3.87–5.11)
RDW: 12.6 % (ref 11.5–15.5)
WBC: 13.1 10*3/uL — AB (ref 4.0–10.5)

## 2015-07-29 LAB — BASIC METABOLIC PANEL
Anion gap: 8 (ref 5–15)
BUN: 24 mg/dL — ABNORMAL HIGH (ref 6–20)
CHLORIDE: 105 mmol/L (ref 101–111)
CO2: 31 mmol/L (ref 22–32)
CREATININE: 0.8 mg/dL (ref 0.44–1.00)
Calcium: 8.8 mg/dL — ABNORMAL LOW (ref 8.9–10.3)
GFR calc Af Amer: >60 mL/min (ref >60)
GFR calc non Af Amer: >60 mL/min (ref >60)
GLUCOSE: 117 mg/dL — AB (ref 65–99)
POTASSIUM: 3.4 mmol/L — AB (ref 3.5–5.1)
SODIUM: 144 mmol/L (ref 135–145)

## 2015-07-29 MED ORDER — ALBUTEROL SULFATE (2.5 MG/3ML) 0.083% IN NEBU: 2.5000 mg | INHALATION_SOLUTION | Freq: Four times a day (QID) | RESPIRATORY_TRACT | Status: DC

## 2015-07-29 MED ORDER — ALBUTEROL (5 MG/ML) CONTINUOUS INHALATION SOLN
10.0000 mg/h | INHALATION_SOLUTION | Freq: Once | RESPIRATORY_TRACT | Status: AC
  Administered 2015-07-29: 10 mg/h via RESPIRATORY_TRACT
  Filled 2015-07-29: qty 20

## 2015-07-29 MED ORDER — DIPHENHYDRAMINE HCL 50 MG/ML IJ SOLN
25.0000 mg | Freq: Once | INTRAMUSCULAR | Status: AC
  Administered 2015-07-29: 25 mg via INTRAVENOUS
  Filled 2015-07-29: qty 1

## 2015-07-29 MED ORDER — ALPRAZOLAM 1 MG PO TABS
2.0000 mg | ORAL_TABLET | Freq: Three times a day (TID) | ORAL | Status: DC | PRN
  Administered 2015-07-29 – 2015-07-31 (×3): 2 mg via ORAL
  Filled 2015-07-29 (×3): qty 2

## 2015-07-29 MED ORDER — ALBUTEROL SULFATE (2.5 MG/3ML) 0.083% IN NEBU
2.5000 mg | INHALATION_SOLUTION | RESPIRATORY_TRACT | Status: DC | PRN
  Administered 2015-07-29: 2.5 mg via RESPIRATORY_TRACT
  Filled 2015-07-29: qty 3

## 2015-07-29 MED ORDER — BISACODYL 5 MG PO TBEC
5.0000 mg | DELAYED_RELEASE_TABLET | Freq: Every day | ORAL | Status: DC | PRN
Start: 2015-07-29 — End: 2015-08-01
  Administered 2015-07-31: 5 mg via ORAL
  Filled 2015-07-29: qty 1

## 2015-07-29 MED ORDER — PANTOPRAZOLE SODIUM 40 MG PO TBEC
40.0000 mg | DELAYED_RELEASE_TABLET | Freq: Every day | ORAL | Status: DC
Start: 2015-07-30 — End: 2015-08-01
  Administered 2015-07-30 – 2015-08-01 (×3): 40 mg via ORAL
  Filled 2015-07-29 (×3): qty 1

## 2015-07-29 MED ORDER — ONDANSETRON HCL 4 MG/2ML IJ SOLN: 4.0000 mg | Freq: Four times a day (QID) | INTRAMUSCULAR | Status: DC | PRN

## 2015-07-29 MED ORDER — ALBUTEROL SULFATE (2.5 MG/3ML) 0.083% IN NEBU
5.0000 mg | INHALATION_SOLUTION | Freq: Once | RESPIRATORY_TRACT | Status: AC
  Administered 2015-07-29: 5 mg via RESPIRATORY_TRACT
  Filled 2015-07-29: qty 6

## 2015-07-29 MED ORDER — TIOTROPIUM BROMIDE MONOHYDRATE 18 MCG IN CAPS
18.0000 ug | ORAL_CAPSULE | Freq: Every day | RESPIRATORY_TRACT | Status: DC
  Administered 2015-07-30: 18 ug via RESPIRATORY_TRACT
  Filled 2015-07-29: qty 5

## 2015-07-29 MED ORDER — PNEUMOCOCCAL VAC POLYVALENT 25 MCG/0.5ML IJ INJ
0.5000 mL | INJECTION | INTRAMUSCULAR | Status: AC
  Administered 2015-07-30: 0.5 mL via INTRAMUSCULAR
  Filled 2015-07-29: qty 0.5

## 2015-07-29 MED ORDER — LEVOFLOXACIN 500 MG PO TABS
500.0000 mg | ORAL_TABLET | Freq: Every day | ORAL | Status: DC
  Administered 2015-07-30 – 2015-08-01 (×3): 500 mg via ORAL
  Filled 2015-07-29 (×3): qty 1

## 2015-07-29 MED ORDER — ENOXAPARIN SODIUM 40 MG/0.4ML ~~LOC~~ SOLN
40.0000 mg | SUBCUTANEOUS | Status: DC
  Administered 2015-07-30 – 2015-08-01 (×3): 40 mg via SUBCUTANEOUS
  Filled 2015-07-29 (×3): qty 0.4

## 2015-07-29 MED ORDER — CETYLPYRIDINIUM CHLORIDE 0.05 % MT LIQD
7.0000 mL | Freq: Two times a day (BID) | OROMUCOSAL | Status: DC
Start: 2015-07-29 — End: 2015-08-01
  Administered 2015-07-29 – 2015-08-01 (×6): 7 mL via OROMUCOSAL

## 2015-07-29 MED ORDER — ONDANSETRON HCL 4 MG PO TABS: 4.0000 mg | ORAL_TABLET | Freq: Four times a day (QID) | ORAL | Status: DC | PRN

## 2015-07-29 MED ORDER — META BIOTIC/BIO-ACTIVE 12 PO CAPS: ORAL_CAPSULE | Freq: Every day | ORAL | Status: DC

## 2015-07-29 MED ORDER — METHYLPREDNISOLONE SODIUM SUCC 125 MG IJ SOLR
125.0000 mg | Freq: Once | INTRAMUSCULAR | Status: AC
  Administered 2015-07-29: 125 mg via INTRAVENOUS
  Filled 2015-07-29: qty 2

## 2015-07-29 NOTE — H&P (Signed)
[facility] History and Physical  Gloria Calderon [id number] DOB: [birth year]    PCP:   Octavio Graves, DO   Chief Complaint: SOB and coughs.   HPI: Gloria Calderon is an 49 y.o. female with hx of COPD, on Spiva and albuterol rescue inhaler, but hadn't use her meds as she lost her insurance coverage, patient of Dr Lamonte Sakai, presented to the ER with wheezing, SOB, DOE, and productive coughs.  Unfortunately, she continued to smoke.  She doesn' t have or need home oxygen in the past.  Evaluation in the ER showed CXR negative for any infiltrate, and WBC of 13K, with normal Cr and K of 3.4.  She was given an hour nebs, and IV Steroids, and trending oxygen went from 94 persent to 80's on RA.  Hospitalist was asked to admit her for COPD exacerbation.  She denied CP.   Rewiew of Systems:  Constitutional: Negative for malaise, fever and chills. No significant weight loss or weight gain Eyes: Negative for eye pain, redness and discharge, diplopia, visual changes, or flashes of light. ENMT: Negative for ear pain, hoarseness, nasal congestion, sinus pressure and sore throat. No headaches; tinnitus, drooling, or problem swallowing. Cardiovascular: Negative for chest pain, palpitations, diaphoresis,  and peripheral edema. ; No orthopnea, PND Respiratory: Negative for hemoptysis, wheezing and stridor. No pleuritic chestpain. Gastrointestinal: Negative for nausea, vomiting, diarrhea, constipation, abdominal pain, melena, blood in stool, hematemesis, jaundice and rectal bleeding.    Genitourinary: Negative for frequency, dysuria, incontinence,flank pain and hematuria; Musculoskeletal: Negative for back pain and neck pain. Negative for swelling and trauma.;  Skin: . Negative for pruritus, rash, abrasions, bruising and skin lesion.; ulcerations Neuro: Negative for headache, lightheadedness and neck stiffness. Negative for weakness, altered level of consciousness , altered mental status, extremity  weakness, burning feet, involuntary movement, seizure and syncope.  Psych: negative for anxiety, depression, insomnia, tearfulness, panic attacks, hallucinations, paranoia, suicidal or homicidal ideation    Past Medical History  Diagnosis Date  . COPD (chronic obstructive pulmonary disease) [facility])     Past Surgical History  Procedure Laterality Date  . Cholecystectomy    . Appendectomy      Medications:  HOME MEDS: Prior to Admission medications   Medication Sig Start Date End Date Taking? Authorizing Provider  albuterol (PROVENTIL HFA;VENTOLIN HFA) 108 (90 BASE) MCG/ACT inhaler Inhale 2 puffs into the lungs every 6 (six) hours as needed for wheezing or shortness of breath.   Yes Historical Provider, MD  albuterol (PROVENTIL) (2.5 MG/3ML) 0.083% nebulizer solution INHALE CONTENTS OF ONE VIAL BY NEBULIZATION EVERY 6 HOURS AS NEEDED FOR WHEEZING OR SHORTNESS OF BREATH 04/16/15  Yes Collene Gobble, MD  ALPRAZolam Duanne Moron) 1 MG tablet Take 2 tablets by mouth 3 (three) times daily as needed for anxiety.    Yes Historical Provider, MD  bisacodyl (DULCOLAX) 5 MG EC tablet Take 5-15 mg by mouth daily as needed for mild constipation.    Yes Historical Provider, MD  cyclobenzaprine (FLEXERIL) 10 MG tablet Take 1 tablet by mouth as needed for muscle spasms.    Yes Historical Provider, MD  ENDOCET 10-325 MG per tablet Take 1 tablet by mouth 2 (two) times daily as needed for pain.  09/24/13  Yes Historical Provider, MD  fluticasone (FLONASE) 50 MCG/ACT nasal spray Place 1 spray into both nostrils daily.   Yes Historical Provider, MD  guaifenesin (MUCUS RELIEF) 400 MG TABS tablet Take 400 mg by mouth every 6 (six) hours as needed (for congestion).  Yes Historical Provider, MD  levofloxacin (LEVAQUIN) 500 MG tablet Take 500 mg by mouth daily.   Yes Historical Provider, MD  Multiple Vitamins-Minerals (MULTIVITAMIN & MINERAL PO) Take 1 tablet by mouth daily.   Yes Historical Provider, MD  pantoprazole  (PROTONIX) 40 MG tablet Take 40 mg by mouth daily. 06/24/15  Yes Historical Provider, MD  Probiotic Product (META BIOTIC/BIO-ACTIVE 12 PO) Take 1 tablet by mouth daily.   Yes Historical Provider, MD  SPIRIVA HANDIHALER 18 MCG inhalation capsule INHALE 1 CAPSULE BY MOUTH ONCE DAILY USING HANDIHALER 04/16/15  Yes Collene Gobble, MD  zinc gluconate 50 MG tablet Take 50 mg by mouth daily.   Yes Historical Provider, MD     Allergies:  Allergies  Allergen Reactions  . Morphine And Related Itching and Nausea And Vomiting    Social History:   reports that she has been smoking Cigarettes.  She has a 30 pack-year smoking history. She has never used smokeless tobacco. She reports that she does not drink alcohol or use illicit drugs.  Family History: Family History  Problem Relation Age of Onset  . COPD Father      Physical Exam: Filed Vitals:   07/29/15 2026 07/29/15 2030 07/29/15 2100 07/29/15 2117  BP:  117/68 108/61   Pulse: 106 92 102   Temp:      TempSrc:      Resp: 15 20 16    Height:      Weight:      SpO2: 95% 96% 97% 98%   Blood pressure 108/61, pulse 102, temperature 98.5 F (36.9 C), temperature source Oral, resp. rate 16, height 5\' 4"  (1.626 m), weight 63.504 kg (140 lb), SpO2 98 %.  GEN:  Pleasant  patient lying in the stretcher in no acute distress; cooperative with exam. PSYCH:  alert and oriented x4; does not appear anxious or depressed; affect is appropriate. HEENT: Mucous membranes pink and anicteric; PERRLA; EOM intact; no cervical lymphadenopathy nor thyromegaly or carotid bruit; no JVD; There were no stridor. Neck is very supple. Breasts:: Not examined CHEST WALL: No tenderness CHEST: Normal respiration. She has bilateral wheezing, both insp and exp.  No rales.  HEART: Regular rate and rhythm.  There are no murmur, rub, or gallops.   BACK: No kyphosis or scoliosis; no CVA tenderness ABDOMEN: soft and non-tender; no masses, no organomegaly, normal abdominal bowel  sounds; no pannus; no intertriginous candida. There is no rebound and no distention. Rectal Exam: Not done EXTREMITIES: No bone or joint deformity; age-appropriate arthropathy of the hands and knees; no edema; no ulcerations.  There is no calf tenderness. Genitalia: not examined PULSES: 2+ and symmetric SKIN: Normal hydration no rash or ulceration CNS: Cranial nerves 2-12 grossly intact no focal lateralizing neurologic deficit.  Speech is fluent; uvula elevated with phonation, facial symmetry and tongue midline. DTR are normal bilaterally, cerebella exam is intact, barbinski is negative and strengths are equaled bilaterally.  No sensory loss.   Labs on Admission:  Basic Metabolic Panel:  Recent Labs Lab 07/29/15 1923  NA 144  K 3.4*  CL 105  CO2 31  GLUCOSE 117*  BUN 24*  CREATININE 0.80  CALCIUM 8.8*   CBC:  Recent Labs Lab 07/29/15 1923  WBC 13.1*  NEUTROABS 8.8*  HGB 17.0*  HCT 51.6*  MCV 101.2*  PLT 254   Radiological Exams on Admission: Dg Chest 2 View  07/29/2015  CLINICAL DATA:  Acute onset of worsening wheezing and shortness of breath. Initial encounter.  EXAM: CHEST  2 VIEW COMPARISON:  Chest radiograph performed 07/25/2015 FINDINGS: The lungs are hyperexpanded, with flattening of the hemidiaphragms, compatible with COPD. Mild bibasilar atelectasis is noted. There is no evidence of pleural effusion or pneumothorax. The heart is normal in size; the mediastinal contour is within normal limits. No acute osseous abnormalities are seen. IMPRESSION: Findings of COPD.  Mild bibasilar atelectasis noted. Electronically Signed   By: Garald Balding M.D.   On: 07/29/2015 20:10    EKG: Independently reviewed.    Assessment/Plan Present on Admission:  . COPD (chronic obstructive pulmonary disease) ([place]) . COPD with exacerbation ([place]) . Tobacco abuse . COPD exacerbation ([place])  PLAN:  Will admit her for COPD exacerbation.  Will continue with IV steroids, nebs, and oral  Levaquin. Will continue Spiriva as well.  I recommended that she quit cigarettes.  She is otherwise stable, full code, and will be admitted to [facility] service.  Thank you and good day.    Other plans as per orders.  Code Status:FULL CODE.    Orvan Falconer, MD. [facility] Pager [phone] 7pm to 7am.  07/29/2015, 10:31 PM

## 2015-07-29 NOTE — ED Provider Notes (Signed)
CSN: [id number]     Arrival date & time 07/29/15  1831 History   First MD Initiated Contact with Patient 07/29/15 1833     Chief Complaint  Patient presents with  . Respiratory Distress   HPI Comments: Patient has history of COPD. She is a chronic smoker. Patient has had trouble with shortness of breath and wheezing over the past week. However today her symptoms became acutely worse. She also developed a red rash involving her face and torso. Patient has been taking Levaquin for a respiratory infection. No known exposures to any allergens. She's had some sharp pain when she is breathing. He denies any history of fever. She called EMS and was noted to be hypoxic on arrival. They had an oxygen saturation in the 80s. She was started on supplemental oxygen and given 3 breathing treatments. Her symptoms have improved somewhat but she continues to feel short of breath.  Patient is a 49 y.o. female presenting with shortness of breath. The history is provided by the patient.  Shortness of Breath Severity:  Severe Onset quality:  Gradual Duration:  1 week Timing:  Constant Progression:  Worsening Chronicity:  Recurrent Associated symptoms: cough and rash   Associated symptoms: no abdominal pain and no fever     Past Medical History  Diagnosis Date  . COPD (chronic obstructive pulmonary disease) [facility])    Past Surgical History  Procedure Laterality Date  . Cholecystectomy    . Appendectomy     Family History  Problem Relation Age of Onset  . COPD Father    Social History  Substance Use Topics  . Smoking status: Current Every Day Smoker -- 1.00 packs/day for 30 years    Types: Cigarettes  . Smokeless tobacco: Never Used  . Alcohol Use: No   OB History    Gravida Para Term Preterm AB TAB SAB Ectopic Multiple Living   1 1 1       1      Review of Systems  Constitutional: Negative for fever.  Respiratory: Positive for cough and shortness of breath.   Gastrointestinal: Negative for  abdominal pain.  Skin: Positive for rash.  All other systems reviewed and are negative.     Allergies  Morphine and related  Home Medications   Prior to Admission medications   Medication Sig Start Date End Date Taking? Authorizing Provider  albuterol (PROVENTIL HFA;VENTOLIN HFA) 108 (90 BASE) MCG/ACT inhaler Inhale 2 puffs into the lungs every 6 (six) hours as needed for wheezing or shortness of breath.   Yes Historical Provider, MD  albuterol (PROVENTIL) (2.5 MG/3ML) 0.083% nebulizer solution INHALE CONTENTS OF ONE VIAL BY NEBULIZATION EVERY 6 HOURS AS NEEDED FOR WHEEZING OR SHORTNESS OF BREATH 04/16/15  Yes Collene Gobble, MD  ALPRAZolam Duanne Moron) 1 MG tablet Take 2 tablets by mouth 3 (three) times daily as needed for anxiety.    Yes Historical Provider, MD  bisacodyl (DULCOLAX) 5 MG EC tablet Take 5-15 mg by mouth daily as needed for mild constipation.    Yes Historical Provider, MD  cyclobenzaprine (FLEXERIL) 10 MG tablet Take 1 tablet by mouth as needed for muscle spasms.    Yes Historical Provider, MD  ENDOCET 10-325 MG per tablet Take 1 tablet by mouth 2 (two) times daily as needed for pain.  09/24/13  Yes Historical Provider, MD  fluticasone (FLONASE) 50 MCG/ACT nasal spray Place 1 spray into both nostrils daily.   Yes Historical Provider, MD  guaifenesin (MUCUS RELIEF) 400 MG TABS  tablet Take 400 mg by mouth every 6 (six) hours as needed (for congestion).   Yes Historical Provider, MD  levofloxacin (LEVAQUIN) 500 MG tablet Take 500 mg by mouth daily.   Yes Historical Provider, MD  Multiple Vitamins-Minerals (MULTIVITAMIN & MINERAL PO) Take 1 tablet by mouth daily.   Yes Historical Provider, MD  pantoprazole (PROTONIX) 40 MG tablet Take 40 mg by mouth daily. 06/24/15  Yes Historical Provider, MD  Probiotic Product (META BIOTIC/BIO-ACTIVE 12 PO) Take 1 tablet by mouth daily.   Yes Historical Provider, MD  SPIRIVA HANDIHALER 18 MCG inhalation capsule INHALE 1 CAPSULE BY MOUTH ONCE DAILY  USING HANDIHALER 04/16/15  Yes Collene Gobble, MD  zinc gluconate 50 MG tablet Take 50 mg by mouth daily.   Yes Historical Provider, MD   BP 108/61 mmHg  Pulse 102  Temp(Src) 98.5 F (36.9 C) (Oral)  Resp 16  Ht 5\' 4"  (1.626 m)  Wt 140 lb (63.504 kg)  BMI 24.02 kg/m2  SpO2 98% Physical Exam  Constitutional: She appears well-developed and well-nourished. No distress.  HENT:  Head: Normocephalic and atraumatic.  Right Ear: External ear normal.  Left Ear: External ear normal.  Eyes: Conjunctivae are normal. Right eye exhibits no discharge. Left eye exhibits no discharge. No scleral icterus.  Neck: Neck supple. No tracheal deviation present.  Cardiovascular: Normal rate, regular rhythm and intact distal pulses.   Pulmonary/Chest: Accessory muscle usage present. No stridor. Tachypnea noted. No respiratory distress. She has wheezes. She has no rales.  Abdominal: Soft. Bowel sounds are normal. She exhibits no distension. There is no tenderness. There is no rebound and no guarding.  Musculoskeletal: She exhibits no edema or tenderness.  Neurological: She is alert. She has normal strength. No cranial nerve deficit (no facial droop, extraocular movements intact, no slurred speech) or sensory deficit. She exhibits normal muscle tone. She displays no seizure activity. Coordination normal.  Skin: Skin is warm and dry. Rash noted. Rash is macular. There is erythema.  Erythematous rash involving the face and torso, no involvement of the lower extremities, no definite urticaria, no papules or pustules  Psychiatric: She has a normal mood and affect.  Nursing note and vitals reviewed.   ED Course  Procedures (including critical care time) Labs Review Labs Reviewed  BASIC METABOLIC PANEL - Abnormal; Notable for the following:    Potassium 3.4 (*)    Glucose, Bld 117 (*)    BUN 24 (*)    Calcium 8.8 (*)    All other components within normal limits  CBC WITH DIFFERENTIAL/PLATELET - Abnormal;  Notable for the following:    WBC 13.1 (*)    Hemoglobin 17.0 (*)    HCT 51.6 (*)    MCV 101.2 (*)    Neutro Abs 8.8 (*)    Monocytes Absolute 1.5 (*)    All other components within normal limits    Imaging Review Dg Chest 2 View  07/29/2015  CLINICAL DATA:  Acute onset of worsening wheezing and shortness of breath. Initial encounter. EXAM: CHEST  2 VIEW COMPARISON:  Chest radiograph performed 07/25/2015 FINDINGS: The lungs are hyperexpanded, with flattening of the hemidiaphragms, compatible with COPD. Mild bibasilar atelectasis is noted. There is no evidence of pleural effusion or pneumothorax. The heart is normal in size; the mediastinal contour is within normal limits. No acute osseous abnormalities are seen. IMPRESSION: Findings of COPD.  Mild bibasilar atelectasis noted. Electronically Signed   By: Garald Balding M.D.   On: 07/29/2015 20:10  I have personally reviewed and evaluated these images and lab results as part of my medical decision-making.   EKG Interpretation   Date/Time:  Sunday July 29 2015 18:37:52 EST Ventricular Rate:  100 PR Interval:  121 QRS Duration: 76 QT Interval:  357 QTC Calculation: 460 R Axis:   80 Text Interpretation:  Sinus tachycardia Nonspecific T abnormalities,  lateral leads No significant change since last tracing Confirmed by Ritika Hellickson   MD-J, Tonianne Fine ([id number]) on 07/29/2015 6:39:47 PM     21 45  patient is feeling better. The erythematous rash has decreased. Pt attempted to walk around the ED.  Pt became tachycardic and o2 sat dropped into the 80s. MDM   Final diagnoses:  COPD exacerbation ([place])  Allergic reaction caused by a drug   The patient showed some improvement with breathing treatments however when we discontinued the oxygen and had her walk around the emergency department she became hypoxic and tachypneic.  The patient had been treated as an outpatient with antibiotics and steroids. I suspect her symptoms are related to a recurrent COPD  exacerbation. There may been a component of an allergic reaction to Levaquin.     Dorie Rank, MD 07/29/15 2156

## 2015-07-29 NOTE — ED Notes (Signed)
Patient brought in via EMS. Aler and oriented. Airway patent. Patient c/o shortness of breath. Per EMS patient's O2 sat 80% upon there arrival. Patient drops quickly when non-rebreather removed and is becoming fatigue. EDP aware and is in room to assess patient. Patient has had three albuterol neb treatments at home and according to patient just finished steriod pack. Patient has hx of COPD. Patient states the shortness of breath hit suddenly like she was having an allergic reaction. Patient red in color.

## 2015-07-30 DIAGNOSIS — Z720 Tobacco use: Secondary | ICD-10-CM | POA: Diagnosis not present

## 2015-07-30 DIAGNOSIS — D45 Polycythemia vera: Secondary | ICD-10-CM | POA: Diagnosis not present

## 2015-07-30 DIAGNOSIS — J441 Chronic obstructive pulmonary disease with (acute) exacerbation: Secondary | ICD-10-CM | POA: Diagnosis not present

## 2015-07-30 LAB — CBC
HEMATOCRIT: 48.1 % — AB (ref 36.0–46.0)
Hemoglobin: 15.7 g/dL — ABNORMAL HIGH (ref 12.0–15.0)
MCH: 33.2 pg (ref 26.0–34.0)
MCHC: 32.6 g/dL (ref 30.0–36.0)
MCV: 101.7 fL — AB (ref 78.0–100.0)
Platelets: 258 10*3/uL (ref 150–400)
RBC: 4.73 MIL/uL (ref 3.87–5.11)
RDW: 12.8 % (ref 11.5–15.5)
WBC: 11.2 10*3/uL — AB (ref 4.0–10.5)

## 2015-07-30 LAB — BASIC METABOLIC PANEL
Anion gap: 8 (ref 5–15)
BUN: 21 mg/dL — ABNORMAL HIGH (ref 6–20)
CHLORIDE: 104 mmol/L (ref 101–111)
CO2: 29 mmol/L (ref 22–32)
Calcium: 8.7 mg/dL — ABNORMAL LOW (ref 8.9–10.3)
Creatinine, Ser: 0.73 mg/dL (ref 0.44–1.00)
GFR calc non Af Amer: >60 mL/min (ref >60)
Glucose, Bld: 141 mg/dL — ABNORMAL HIGH (ref 65–99)
POTASSIUM: 4.5 mmol/L (ref 3.5–5.1)
SODIUM: 141 mmol/L (ref 135–145)

## 2015-07-30 MED ORDER — ALBUTEROL SULFATE (2.5 MG/3ML) 0.083% IN NEBU
2.5000 mg | INHALATION_SOLUTION | Freq: Four times a day (QID) | RESPIRATORY_TRACT | Status: DC
  Administered 2015-07-30: 2.5 mg via RESPIRATORY_TRACT
  Filled 2015-07-30: qty 3

## 2015-07-30 MED ORDER — METHYLPREDNISOLONE SODIUM SUCC 125 MG IJ SOLR
60.0000 mg | Freq: Four times a day (QID) | INTRAMUSCULAR | Status: DC
  Administered 2015-07-30 – 2015-08-01 (×9): 60 mg via INTRAVENOUS
  Filled 2015-07-30 (×10): qty 2

## 2015-07-30 MED ORDER — IPRATROPIUM-ALBUTEROL 0.5-2.5 (3) MG/3ML IN SOLN: 3.0000 mL | RESPIRATORY_TRACT | Status: DC | PRN

## 2015-07-30 MED ORDER — IPRATROPIUM-ALBUTEROL 0.5-2.5 (3) MG/3ML IN SOLN: 3.0000 mL | Freq: Four times a day (QID) | RESPIRATORY_TRACT | Status: DC

## 2015-07-30 MED ORDER — IPRATROPIUM-ALBUTEROL 0.5-2.5 (3) MG/3ML IN SOLN
3.0000 mL | Freq: Four times a day (QID) | RESPIRATORY_TRACT | Status: DC
  Administered 2015-07-30 – 2015-07-31 (×4): 3 mL via RESPIRATORY_TRACT
  Filled 2015-07-30 (×5): qty 3

## 2015-07-30 MED ORDER — ALBUTEROL SULFATE (2.5 MG/3ML) 0.083% IN NEBU: 2.5000 mg | INHALATION_SOLUTION | RESPIRATORY_TRACT | Status: DC | PRN

## 2015-07-30 MED ORDER — OXYCODONE-ACETAMINOPHEN 5-325 MG PO TABS
2.0000 | ORAL_TABLET | Freq: Four times a day (QID) | ORAL | Status: DC | PRN
  Administered 2015-07-30 – 2015-07-31 (×2): 2 via ORAL
  Filled 2015-07-30 (×2): qty 2

## 2015-07-30 NOTE — Progress Notes (Signed)
[facility] PROGRESS NOTE  Gloria Calderon [id number] DOB: [birth year] DOA: 07/29/2015 PCP: Samuel Jester, DO  HPI/Brief narrative 49 y.o. female with hx of COPD, on Spiva and albuterol rescue inhaler, but hadn't use her meds as she lost her insurance coverage, patient of Dr Delton Coombes, presented to the ER with wheezing, SOB, DOE, and productive coughs. Patient was admitted for COPD exacerbation.  Assessment/Plan: 1. COPD exacerbation 1. Still active wheezing on exam 2. Pt reports feeling better overnight 3. Will cont on scheduled duonebs and scheduled q6hrs solumedrol 4. As pt improves, would gradually taper steroids as tolerated 2. Tobacco abuse 1. Cessation was done 3. Polycythemia 1. Likely secondary to COPD 2. Monitor for now 4. DVT Prophylaxis 1. Cont lovenox subQ  Code Status: full Family Communication: Pt in room Disposition Plan: anticipate home in 24-48hrs when wheezing improves   Consultants:    Procedures:    Antibiotics: Anti-infectives    Start     Dose/Rate Route Frequency Ordered Stop   07/30/15 1000  levofloxacin (LEVAQUIN) tablet 500 mg     500 mg Oral Daily 07/29/15 2303        HPI/Subjective: Reports feeling better today  Objective: Filed Vitals:   07/30/15 0745 07/30/15 1036 07/30/15 1317 07/30/15 1450  BP:   132/76   Pulse:   81   Temp:   97.9 F (36.6 C)   TempSrc:   Oral   Resp:   20   Height:      Weight:      SpO2: 98% 97% 98% 96%    Intake/Output Summary (Last 24 hours) at 07/30/15 1609 Last data filed at 07/30/15 1200  Gross per 24 hour  Intake    720 ml  Output      2 ml  Net    718 ml   Filed Weights   07/29/15 1836 07/29/15 2230  Weight: 63.504 kg (140 lb) 61.1 kg (134 lb 11.2 oz)    Exam:   General:  Awake, in nad  Cardiovascular: regular, s1, s2  Respiratory: normal resp effort, no wheezing  Abdomen: soft, nondistended  Musculoskeletal: perfused, no clubbing   Data Reviewed: Basic Metabolic  Panel:  Recent Labs Lab 07/29/15 1923 07/30/15 0613  NA 144 141  Calderon 3.4* 4.5  CL 105 104  CO2 31 29  GLUCOSE 117* 141*  BUN 24* 21*  CREATININE 0.80 0.73  CALCIUM 8.8* 8.7*   Liver Function Tests: No results for input(s): AST, ALT, ALKPHOS, BILITOT, PROT, ALBUMIN in the last 168 hours. No results for input(s): LIPASE, AMYLASE in the last 168 hours. No results for input(s): AMMONIA in the last 168 hours. CBC:  Recent Labs Lab 07/29/15 1923 07/30/15 0613  WBC 13.1* 11.2*  NEUTROABS 8.8*  --   HGB 17.0* 15.7*  HCT 51.6* 48.1*  MCV 101.2* 101.7*  PLT 254 258   Cardiac Enzymes: No results for input(s): CKTOTAL, CKMB, CKMBINDEX, TROPONINI in the last 168 hours. BNP (last 3 results) No results for input(s): BNP in the last 8760 hours.  ProBNP (last 3 results) No results for input(s): PROBNP in the last 8760 hours.  CBG: No results for input(s): GLUCAP in the last 168 hours.  No results found for this or any previous visit (from the past 240 hour(s)).   Studies: Dg Chest 2 View  07/29/2015  CLINICAL DATA:  Acute onset of worsening wheezing and shortness of breath. Initial encounter. EXAM: CHEST  2 VIEW COMPARISON:  Chest radiograph performed 07/25/2015 FINDINGS: The  lungs are hyperexpanded, with flattening of the hemidiaphragms, compatible with COPD. Mild bibasilar atelectasis is noted. There is no evidence of pleural effusion or pneumothorax. The heart is normal in size; the mediastinal contour is within normal limits. No acute osseous abnormalities are seen. IMPRESSION: Findings of COPD.  Mild bibasilar atelectasis noted. Electronically Signed   By: Roanna Raider M.D.   On: 07/29/2015 20:10    Scheduled Meds: . antiseptic oral rinse  7 mL Mouth Rinse BID  . enoxaparin (LOVENOX) injection  40 mg Subcutaneous Q24H  . ipratropium-albuterol  3 mL Nebulization Q6H  . levofloxacin  500 mg Oral Daily  . methylPREDNISolone (SOLU-MEDROL) injection  60 mg Intravenous Q6H  .  pantoprazole  40 mg Oral Daily   Continuous Infusions:   Principal Problem:   COPD (chronic obstructive pulmonary disease) (HCC) Active Problems:   COPD with exacerbation (HCC)   Tobacco abuse   COPD exacerbation (H[facility])   Gloria Calderon  [facility] Pager [phone]. If 7PM-7AM, please contact night-coverage at [email or web address], password [facility] 07/30/2015, 4:09 PM  LOS: 1 day

## 2015-07-30 NOTE — Care Management Note (Signed)
Case Management Note  Patient Details  Name: Gloria Calderon MRN: [id number] Date of Birth: [birth year]  Subjective/Objective:                  Pt is from home, lives with her mother and sister. Pt admitted with COPD. Pt has no insurance but does have PCP. Pt has neb machine but not home O2. Pt is ind with ADL's. Pt has no HH services prior to admission. Pt has applied for disability and medicaid with determinations pending on both. FC has been made aware of pt's status.   Action/Plan: Pt plans to return home with self care at DC. Pt will need home O2 eval. Will cont to follow to ensure pt will be able to afford meds at DC.   Expected Discharge Date:    08/01/2015              Expected Discharge Plan:  Home/Self Care  In-House Referral:  Financial Counselor  Discharge planning Services  CM Consult  Post Acute Care Choice:  NA Choice offered to:  NA  DME Arranged:    DME Agency:     HH Arranged:    HH Agency:     Status of Service:  In process, will continue to follow  Medicare Important Message Given:    Date Medicare IM Given:    Medicare IM give by:    Date Additional Medicare IM Given:    Additional Medicare Important Message give by:     If discussed at [place] of Stay Meetings, dates discussed:    Additional Comments:  Sherald Barge, RN 07/30/2015, 11:53 AM

## 2015-07-31 DIAGNOSIS — Z720 Tobacco use: Secondary | ICD-10-CM | POA: Diagnosis not present

## 2015-07-31 DIAGNOSIS — J441 Chronic obstructive pulmonary disease with (acute) exacerbation: Secondary | ICD-10-CM | POA: Diagnosis not present

## 2015-07-31 MED ORDER — TIOTROPIUM BROMIDE MONOHYDRATE 18 MCG IN CAPS
18.0000 ug | ORAL_CAPSULE | Freq: Every day | RESPIRATORY_TRACT | Status: DC
  Administered 2015-08-01: 18 ug via RESPIRATORY_TRACT
  Filled 2015-07-31: qty 5

## 2015-07-31 MED ORDER — ALBUTEROL SULFATE (2.5 MG/3ML) 0.083% IN NEBU
2.5000 mg | INHALATION_SOLUTION | RESPIRATORY_TRACT | Status: DC | PRN
  Administered 2015-07-31: 2.5 mg via RESPIRATORY_TRACT
  Filled 2015-07-31: qty 3

## 2015-07-31 MED ORDER — BUDESONIDE-FORMOTEROL FUMARATE 160-4.5 MCG/ACT IN AERO
2.0000 | INHALATION_SPRAY | Freq: Two times a day (BID) | RESPIRATORY_TRACT | Status: DC
  Administered 2015-07-31 – 2015-08-01 (×2): 2 via RESPIRATORY_TRACT
  Filled 2015-07-31: qty 6

## 2015-07-31 MED ORDER — NICOTINE 21 MG/24HR TD PT24
21.0000 mg | MEDICATED_PATCH | Freq: Every day | TRANSDERMAL | Status: DC
  Administered 2015-07-31 – 2015-08-01 (×2): 21 mg via TRANSDERMAL
  Filled 2015-07-31 (×2): qty 1

## 2015-07-31 NOTE — Progress Notes (Signed)
[facility] PROGRESS NOTE  Gloria Calderon [id number] DOB: 1966/04/24 DOA: 07/29/2015 PCP: Octavio Graves, DO  Assessment/Plan: COPD with Acute Exacerbation/Acute Hypoxemic Respiratory Failure -Still wheezing this am. -Continue steroids at current dose. -Optimize COPD regimen with symbicort, spiriva and PRN albuterol. -Will need to determine oxygen necessity prior to DC home.  Tobacco Abuse -Counseled on cessation. -Will provide a nicotine patch.  Polycythemia -Due to COPD. -Follow Hb.  Code Status: Full Code Family Communication: Patient only  Disposition Plan: Home when ready, anticipate 24-48 hours   Consultants:  None   Antibiotics:  Levaquin   Subjective: C/o sore throat, feels anxious, has not smoked in 5 days  Objective: Filed Vitals:   07/31/15 0639 07/31/15 0838 07/31/15 1409 07/31/15 1505  BP: 128/79   109/75  Pulse: 82   99  Temp: 98 F (36.7 C)   98 F (36.7 C)  TempSrc: Oral   Oral  Resp: 20   20  Height:      Weight:      SpO2: 96% 98% 94% 92%    Intake/Output Summary (Last 24 hours) at 07/31/15 1643 Last data filed at 07/31/15 1230  Gross per 24 hour  Intake    960 ml  Output      4 ml  Net    956 ml   Filed Weights   07/29/15 1836 07/29/15 2230  Weight: 63.504 kg (140 lb) 61.1 kg (134 lb 11.2 oz)    Exam:   General:  AA Ox3  Cardiovascular: RRR  Respiratory: CTA B  Abdomen: S/NT/ND/+BS  Extremities: no C/C/E   Neurologic:  Grossly intact and non-focal  Data Reviewed: Basic Metabolic Panel:  Recent Labs Lab 07/29/15 1923 07/30/15 0613  NA 144 141  K 3.4* 4.5  CL 105 104  CO2 31 29  GLUCOSE 117* 141*  BUN 24* 21*  CREATININE 0.80 0.73  CALCIUM 8.8* 8.7*   Liver Function Tests: No results for input(s): AST, ALT, ALKPHOS, BILITOT, PROT, ALBUMIN in the last 168 hours. No results for input(s): LIPASE, AMYLASE in the last 168 hours. No results for input(s): AMMONIA in the last 168  hours. CBC:  Recent Labs Lab 07/29/15 1923 07/30/15 0613  WBC 13.1* 11.2*  NEUTROABS 8.8*  --   HGB 17.0* 15.7*  HCT 51.6* 48.1*  MCV 101.2* 101.7*  PLT 254 258   Cardiac Enzymes: No results for input(s): CKTOTAL, CKMB, CKMBINDEX, TROPONINI in the last 168 hours. BNP (last 3 results) No results for input(s): BNP in the last 8760 hours.  ProBNP (last 3 results) No results for input(s): PROBNP in the last 8760 hours.  CBG: No results for input(s): GLUCAP in the last 168 hours.  No results found for this or any previous visit (from the past 240 hour(s)).   Studies: Dg Chest 2 View  07/29/2015  CLINICAL DATA:  Acute onset of worsening wheezing and shortness of breath. Initial encounter. EXAM: CHEST  2 VIEW COMPARISON:  Chest radiograph performed 07/25/2015 FINDINGS: The lungs are hyperexpanded, with flattening of the hemidiaphragms, compatible with COPD. Mild bibasilar atelectasis is noted. There is no evidence of pleural effusion or pneumothorax. The heart is normal in size; the mediastinal contour is within normal limits. No acute osseous abnormalities are seen. IMPRESSION: Findings of COPD.  Mild bibasilar atelectasis noted. Electronically Signed   By: Garald Balding M.D.   On: 07/29/2015 20:10    Scheduled Meds: . antiseptic oral rinse  7 mL Mouth Rinse BID  .  budesonide-formoterol  2 puff Inhalation BID  . enoxaparin (LOVENOX) injection  40 mg Subcutaneous Q24H  . levofloxacin  500 mg Oral Daily  . methylPREDNISolone (SOLU-MEDROL) injection  60 mg Intravenous Q6H  . pantoprazole  40 mg Oral Daily  . tiotropium  18 mcg Inhalation Daily   Continuous Infusions:   Principal Problem:   COPD (chronic obstructive pulmonary disease) (HCC) Active Problems:   COPD with exacerbation (HCC)   Tobacco abuse   COPD exacerbation ([place])    Time spent: 25 minutes. Greater than 50% of this time was spent in direct contact with the patient coordinating care.    Lelon Frohlich  [facility] Pager [phone]  If 7PM-7AM, please contact night-coverage at [email or web address], password [facility] 07/31/2015, 4:43 PM  LOS: 2 days

## 2015-08-01 DIAGNOSIS — Z720 Tobacco use: Secondary | ICD-10-CM | POA: Diagnosis not present

## 2015-08-01 DIAGNOSIS — J441 Chronic obstructive pulmonary disease with (acute) exacerbation: Secondary | ICD-10-CM | POA: Diagnosis not present

## 2015-08-01 MED ORDER — PREDNISONE 10 MG PO TABS: 10.0000 mg | ORAL_TABLET | Freq: Every day | ORAL | Status: DC

## 2015-08-01 MED ORDER — LEVOFLOXACIN 500 MG PO TABS: 500.0000 mg | ORAL_TABLET | Freq: Every day | ORAL | Status: DC

## 2015-08-01 MED ORDER — TIOTROPIUM BROMIDE MONOHYDRATE 18 MCG IN CAPS: 18.0000 ug | ORAL_CAPSULE | Freq: Every day | RESPIRATORY_TRACT | Status: DC

## 2015-08-01 MED ORDER — BUDESONIDE-FORMOTEROL FUMARATE 160-4.5 MCG/ACT IN AERO: 2.0000 | INHALATION_SPRAY | Freq: Two times a day (BID) | RESPIRATORY_TRACT | Status: DC

## 2015-08-01 NOTE — Progress Notes (Signed)
Discharged home with instructions given on medications,and follow up visits,patient verbalized understanding. Prescriptions sent to Pharmacy of choice documented on AVS. Accompanied by staff to an awaiting vehicle. 

## 2015-08-01 NOTE — Discharge Summary (Signed)
Physician Discharge Summary  Gloria Calderon [id number] DOB: 09-15-1966 DOA: 07/29/2015  PCP: Octavio Graves, DO  Admit date: 07/29/2015 Discharge date: 08/01/2015  Time spent: 45 minutes  Recommendations for Outpatient Follow-up:  -Will be discharged home today. -Advised to follow up with PCP in 2 weeks.   Discharge Diagnoses:  Principal Problem:   COPD (chronic obstructive pulmonary disease) ([place]) Active Problems:   COPD with exacerbation (HCC)   Tobacco abuse   COPD exacerbation ([place])   Discharge Condition: Stable and improved  Filed Weights   07/29/15 1836 07/29/15 2230  Weight: 63.504 kg (140 lb) 61.1 kg (134 lb 11.2 oz)    History of present illness:  As per Dr. Marin Comment 11/13: Gloria Calderon is an 49 y.o. female with hx of COPD, on Spiva and albuterol rescue inhaler, but hadn't use her meds as she lost her insurance coverage, patient of Dr Lamonte Sakai, presented to the ER with wheezing, SOB, DOE, and productive coughs. Unfortunately, she continued to smoke. She doesn' t have or need home oxygen in the past. Evaluation in the ER showed CXR negative for any infiltrate, and WBC of 13K, with normal Cr and K of 3.4. She was given an hour nebs, and IV Steroids, and trending oxygen went from 94 persent to 80's on RA. Hospitalist was asked to admit her for COPD exacerbation. She denied CP.   Hospital Course:   COPD with Acute Exacerbation/Acute Hypoxemic Respiratory Failure -Marked clinical improvement. -no further wheezing or cough. -Optimize COPD regimen with symbicort, spiriva and PRN albuterol. -No current oxygen requirements. -Will DC on 5 more days of levaquin and a prednisone taper.  Tobacco Abuse -Counseled on cessation. -No interested in a nicotine patch prescription.  Polycythemia -Due to COPD. -Hb around 15-17.   Procedures:  None   Consultations:  None  Discharge Instructions  Discharge Instructions    Increase activity slowly     Complete by:  As directed             Medication List    TAKE these medications        albuterol 108 (90 BASE) MCG/ACT inhaler  Commonly known as:  PROVENTIL HFA;VENTOLIN HFA  Inhale 2 puffs into the lungs every 6 (six) hours as needed for wheezing or shortness of breath.     albuterol (2.5 MG/3ML) 0.083% nebulizer solution  Commonly known as:  PROVENTIL  INHALE CONTENTS OF ONE VIAL BY NEBULIZATION EVERY 6 HOURS AS NEEDED FOR WHEEZING OR SHORTNESS OF BREATH     ALPRAZolam 1 MG tablet  Commonly known as:  XANAX  Take 2 tablets by mouth 3 (three) times daily as needed for anxiety.     bisacodyl 5 MG EC tablet  Commonly known as:  DULCOLAX  Take 5-15 mg by mouth daily as needed for mild constipation.     budesonide-formoterol 160-4.5 MCG/ACT inhaler  Commonly known as:  SYMBICORT  Inhale 2 puffs into the lungs 2 (two) times daily.     cyclobenzaprine 10 MG tablet  Commonly known as:  FLEXERIL  Take 1 tablet by mouth as needed for muscle spasms.     ENDOCET 10-325 MG tablet  Generic drug:  oxyCODONE-acetaminophen  Take 1 tablet by mouth 2 (two) times daily as needed for pain.     fluticasone 50 MCG/ACT nasal spray  Commonly known as:  FLONASE  Place 1 spray into both nostrils daily.     levofloxacin 500 MG tablet  Commonly known as:  LEVAQUIN  Take 1 tablet (500 mg total) by mouth daily.     META BIOTIC/BIO-ACTIVE 12 PO  Take 1 tablet by mouth daily.     MUCUS RELIEF 400 MG Tabs tablet  Generic drug:  guaifenesin  Take 400 mg by mouth every 6 (six) hours as needed (for congestion).     MULTIVITAMIN & MINERAL PO  Take 1 tablet by mouth daily.     pantoprazole 40 MG tablet  Commonly known as:  PROTONIX  Take 40 mg by mouth daily.     predniSONE 10 MG tablet  Commonly known as:  DELTASONE  Take 1 tablet (10 mg total) by mouth daily with breakfast. Take 6 tablets today and then decrease by 1 tablet daily until none are left.     tiotropium 18 MCG inhalation  capsule  Commonly known as:  SPIRIVA  Place 1 capsule (18 mcg total) into inhaler and inhale daily.     zinc gluconate 50 MG tablet  Take 50 mg by mouth daily.       Allergies  Allergen Reactions  . Morphine And Related Itching and Nausea And Vomiting       Follow-up Information    Follow up with [place], DO. Schedule an appointment as soon as possible for a visit in 2 weeks.   Contact information:   [facility] [address] [place] [address] [phone]        The results of significant diagnostics from this hospitalization (including imaging, microbiology, ancillary and laboratory) are listed below for reference.    Significant Diagnostic Studies: Dg Chest 2 View  07/29/2015  CLINICAL DATA:  Acute onset of worsening wheezing and shortness of breath. Initial encounter. EXAM: CHEST  2 VIEW COMPARISON:  Chest radiograph performed 07/25/2015 FINDINGS: The lungs are hyperexpanded, with flattening of the hemidiaphragms, compatible with COPD. Mild bibasilar atelectasis is noted. There is no evidence of pleural effusion or pneumothorax. The heart is normal in size; the mediastinal contour is within normal limits. No acute osseous abnormalities are seen. IMPRESSION: Findings of COPD.  Mild bibasilar atelectasis noted. Electronically Signed   By: Garald Balding M.D.   On: 07/29/2015 20:10    Microbiology: No results found for this or any previous visit (from the past 240 hour(s)).   Labs: Basic Metabolic Panel:  Recent Labs Lab 07/29/15 1923 07/30/15 0613  NA 144 141  K 3.4* 4.5  CL 105 104  CO2 31 29  GLUCOSE 117* 141*  BUN 24* 21*  CREATININE 0.80 0.73  CALCIUM 8.8* 8.7*   Liver Function Tests: No results for input(s): AST, ALT, ALKPHOS, BILITOT, PROT, ALBUMIN in the last 168 hours. No results for input(s): LIPASE, AMYLASE in the last 168 hours. No results for input(s): AMMONIA in the last 168 hours. CBC:  Recent Labs Lab 07/29/15 1923 07/30/15 0613  WBC  13.1* 11.2*  NEUTROABS 8.8*  --   HGB 17.0* 15.7*  HCT 51.6* 48.1*  MCV 101.2* 101.7*  PLT 254 258   Cardiac Enzymes: No results for input(s): CKTOTAL, CKMB, CKMBINDEX, TROPONINI in the last 168 hours. BNP: BNP (last 3 results) No results for input(s): BNP in the last 8760 hours.  ProBNP (last 3 results) No results for input(s): PROBNP in the last 8760 hours.  CBG: No results for input(s): GLUCAP in the last 168 hours.     SignedLelon Frohlich  [facility] Pager: [phone] 08/01/2015, 3:44 PM

## 2015-08-01 NOTE — Care Management Note (Signed)
Case Management Note  Patient Details  Name: LEVADA NEMECEK MRN: [id number] Date of Birth: May 06, 1966   Expected Discharge Date:                  Expected Discharge Plan:  Home/Self Care  In-House Referral:  Financial Counselor  Discharge planning Services  CM Consult, [facility]  Post Acute Care Choice:  NA Choice offered to:  NA  DME Arranged:    DME Agency:     HH Arranged:    HH Agency:     Status of Service:  Completed, signed off  Medicare Important Message Given:    Date Medicare IM Given:    Medicare IM give by:    Date Additional Medicare IM Given:    Additional Medicare Important Message give by:     If discussed at [place] of Stay Meetings, dates discussed:    Additional Comments: Pt discharging home today with self care. Pt does not meet requirements for home O2. Pt given MATCH voucher for new Rx's. Pt's medicaid is pending. Pt has PCP to f/u with. No further CM needs.  Sherald Barge, RN 08/01/2015, 3:34 PM

## 2015-09-20 MED FILL — predniSONE 10 MG TABS: 10 | 12 days supply | Qty: 48 | Fill #0

## 2015-09-20 MED FILL — ALPRAZolam 1 MG TABS: 1 | 30 days supply | Qty: 90 | Fill #1

## 2015-09-21 MED FILL — OXYCODONE-APAP 10-325 TAB: 10-325 | 30 days supply | Qty: 120 | Fill #0

## 2015-10-19 MED FILL — ALPRAZolam 1 MG TABS: 1 | 30 days supply | Qty: 90 | Fill #2

## 2015-10-22 MED FILL — OXYCODONE-APAP 10-325 TAB: 10-325 | 30 days supply | Qty: 120 | Fill #0

## 2015-11-18 IMAGING — CR DG CHEST 2V
2 series · 2 of 2 positions shown · non-contrast
Comparison: December 06, 2008

CLINICAL DATA: Cough, congestion

EXAM:
CHEST  2 VIEW

[view not recorded (1 of 2)]
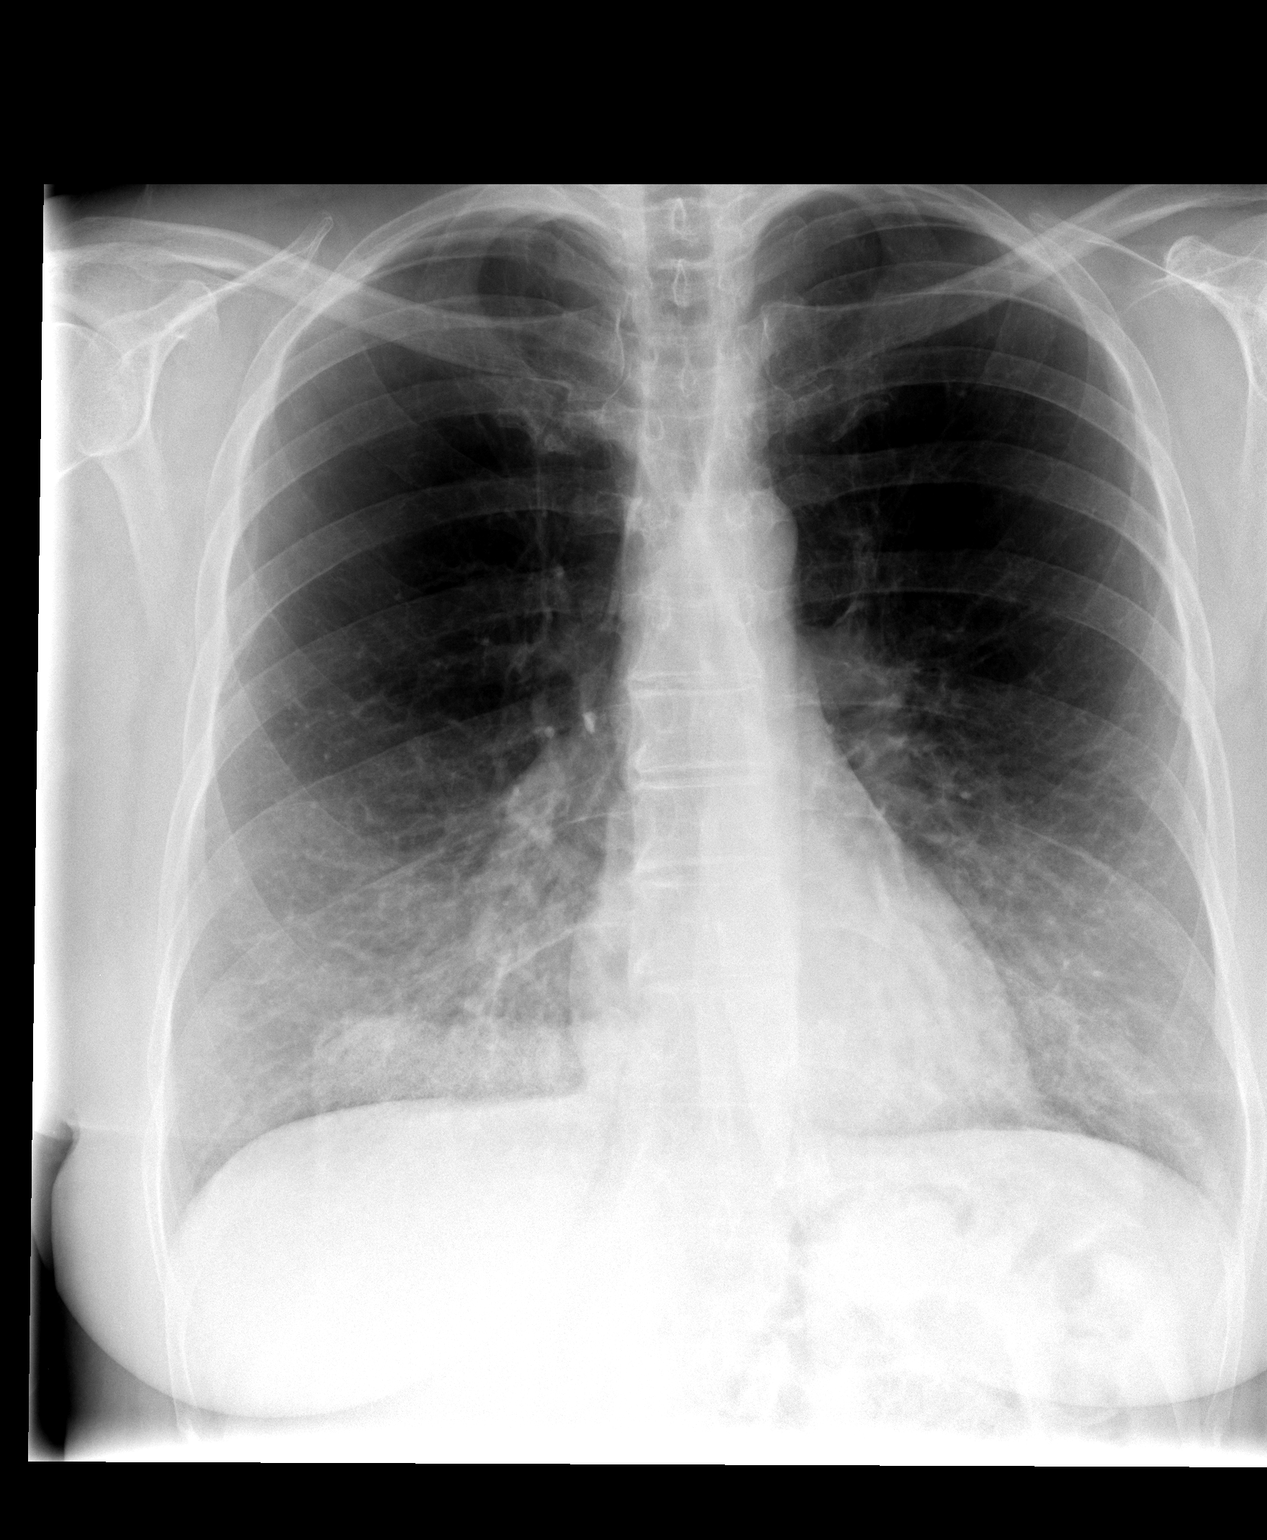

[view not recorded (2 of 2)]
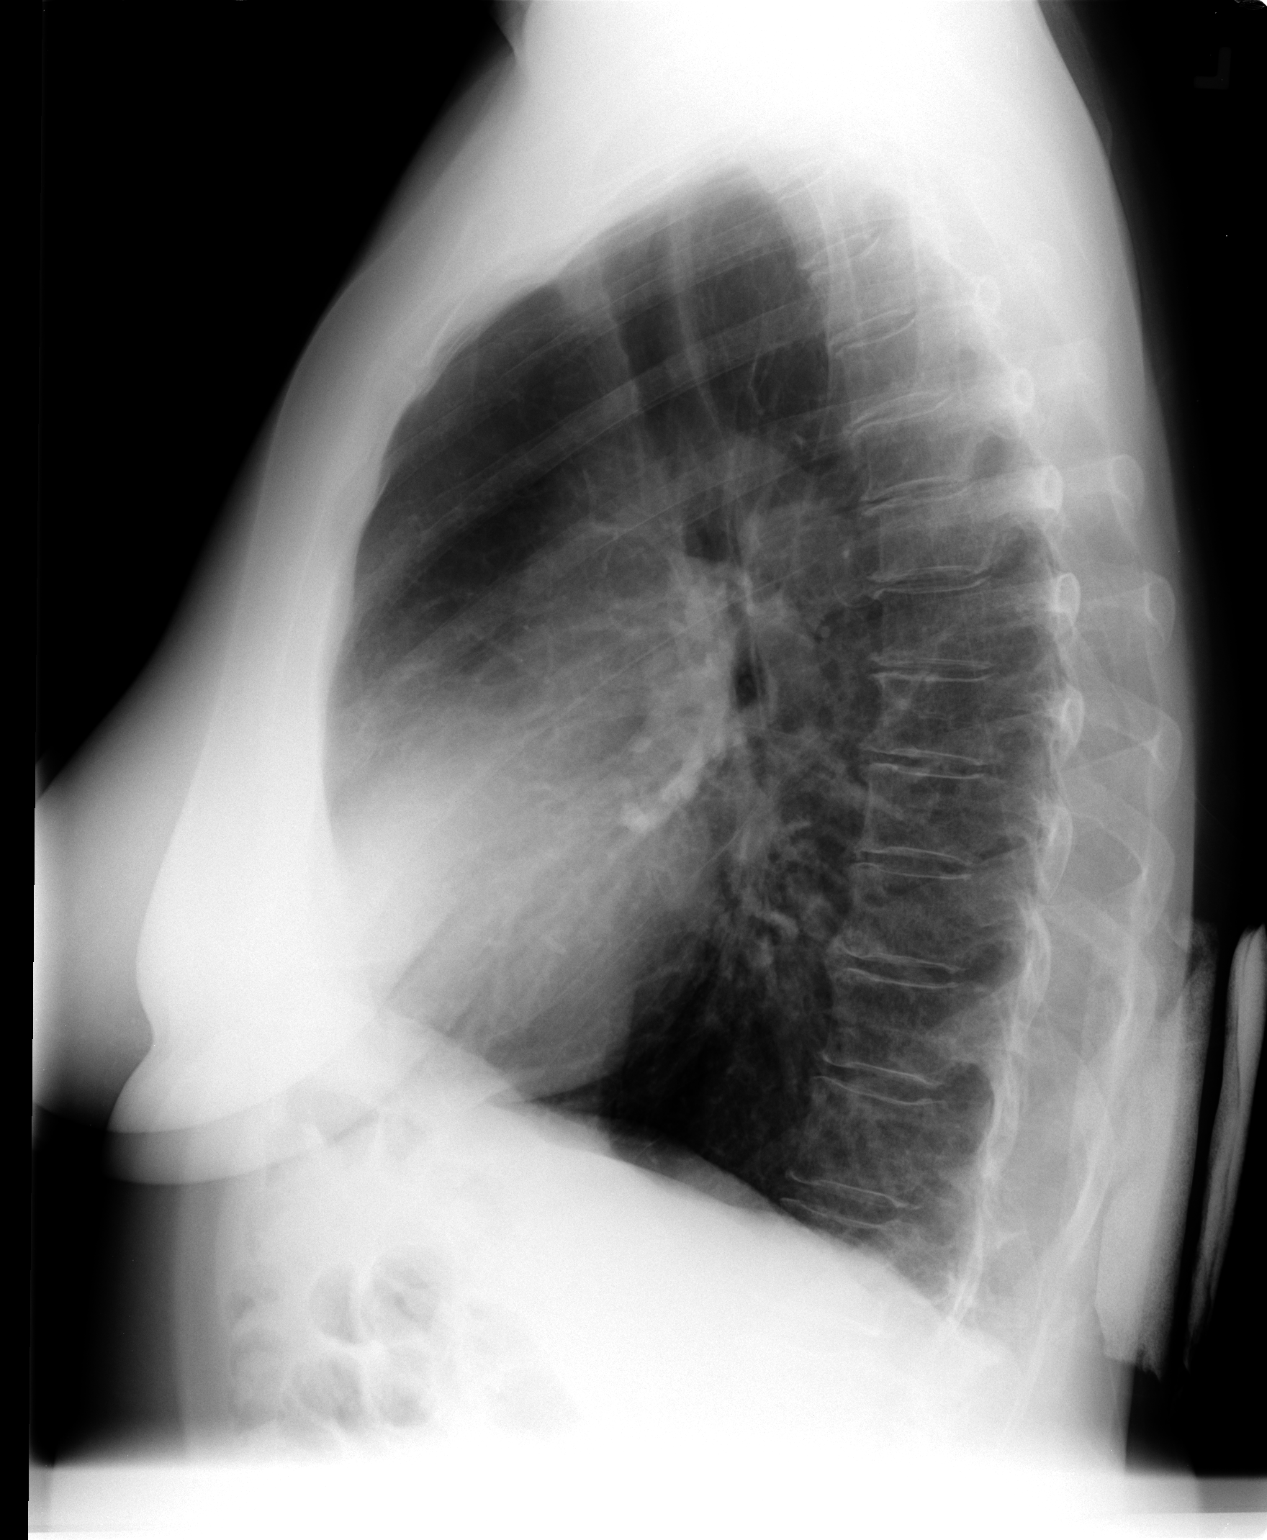

[2 of 2 positions shown; findings below may reference images not displayed]

FINDINGS: The heart size and mediastinal contours are within normal limits.
There is no focal infiltrate, pulmonary edema, or pleural effusion.
The visualized skeletal structures are stable.
IMPRESSION: No active cardiopulmonary disease.

## 2015-11-19 MED FILL — ALPRAZolam 1 MG TABS: 1 | 30 days supply | Qty: 90 | Fill #3

## 2015-11-19 MED FILL — OXYCODONE-APAP 10-325 TAB: 10-325 | 30 days supply | Qty: 120 | Fill #0

## 2015-12-18 MED FILL — OXYCODONE-APAP 10-325 TAB: 10-325 | 30 days supply | Qty: 120 | Fill #0

## 2015-12-18 MED FILL — ALPRAZolam 1 MG TABS: 1 | 30 days supply | Qty: 90 | Fill #4

## 2016-01-15 MED FILL — ALPRAZolam 1 MG TABS: 1 | 30 days supply | Qty: 90 | Fill #5

## 2016-01-16 MED FILL — OXYCODONE-APAP 10-325 TAB: 10-325 | 30 days supply | Qty: 120 | Fill #0

## 2016-02-14 MED FILL — ALPRAZolam 1 MG TABS: 1 | 30 days supply | Qty: 90 | Fill #0

## 2016-02-14 MED FILL — OXYCODONE-APAP 10-325 TAB: 10-325 | 30 days supply | Qty: 240 | Fill #0

## 2016-03-14 MED FILL — OXYCODONE-APAP 10-325 TAB: 10-325 | 30 days supply | Qty: 240 | Fill #0

## 2016-03-14 MED FILL — ALPRAZolam 1 MG TABS: 1 | 30 days supply | Qty: 90 | Fill #1

## 2016-04-14 MED FILL — OXYCODONE-APAP 10-325 TAB: 10-325 | 30 days supply | Qty: 240 | Fill #0

## 2016-04-14 MED FILL — ALPRAZolam 1 MG TABS: 1 | 30 days supply | Qty: 90 | Fill #2

## 2016-05-14 MED FILL — OXYCODONE-APAP 10-325: 10-325 | 30 days supply | Qty: 240 | Fill #0

## 2016-05-14 MED FILL — ALPRAZolam 1 MG TABS: 1 | 30 days supply | Qty: 90 | Fill #3

## 2016-06-13 MED FILL — OXYCODONE-APAP 10-325: 10-325 | 30 days supply | Qty: 240 | Fill #0

## 2016-06-13 MED FILL — ALPRAZolam 1 MG TABS: 1 | 30 days supply | Qty: 90 | Fill #4 | Status: TO

## 2016-06-27 ENCOUNTER — Ambulatory Visit: Payer: Self-pay | Admitting: Physician Assistant

## 2016-12-28 ENCOUNTER — Encounter (HOSPITAL_COMMUNITY): Payer: Self-pay | Admitting: *Deleted

## 2016-12-28 DIAGNOSIS — Z79899 Other long term (current) drug therapy: Secondary | ICD-10-CM | POA: Insufficient documentation

## 2016-12-28 DIAGNOSIS — K5900 Constipation, unspecified: Secondary | ICD-10-CM | POA: Insufficient documentation

## 2016-12-28 DIAGNOSIS — J449 Chronic obstructive pulmonary disease, unspecified: Secondary | ICD-10-CM | POA: Diagnosis not present

## 2016-12-28 DIAGNOSIS — F17210 Nicotine dependence, cigarettes, uncomplicated: Secondary | ICD-10-CM | POA: Insufficient documentation

## 2016-12-28 NOTE — ED Triage Notes (Signed)
Pt c/o constipation, states that she has not been able to have a bowel movement in 3 days, has used laxatives at home without relief,

## 2016-12-29 ENCOUNTER — Emergency Department (HOSPITAL_COMMUNITY)
Admission: EM | Admit: 2016-12-29 | Discharge: 2016-12-29 | Disposition: A | Discharge status: Home / Self Care | Payer: Medicaid Other | Attending: Emergency Medicine | Admitting: Emergency Medicine

## 2016-12-29 DIAGNOSIS — K5900 Constipation, unspecified: Secondary | ICD-10-CM

## 2016-12-29 MED ORDER — DOCUSATE SODIUM 100 MG PO CAPS: 100.0000 mg | ORAL_CAPSULE | Freq: Two times a day (BID) | ORAL | 0 refills | Status: DC | PRN

## 2016-12-29 MED ORDER — MAGNESIUM CITRATE PO SOLN: 1.0000 | Freq: Once | ORAL | 1 refills | Status: AC

## 2016-12-29 MED ORDER — MAGNESIUM CITRATE PO SOLN
1.0000 | Freq: Once | ORAL | Status: AC
  Administered 2016-12-29: 1 via ORAL
  Filled 2016-12-29: qty 296

## 2016-12-29 MED ORDER — DOCUSATE SODIUM 100 MG PO CAPS
100.0000 mg | ORAL_CAPSULE | Freq: Once | ORAL | Status: AC
  Administered 2016-12-29: 100 mg via ORAL
  Filled 2016-12-29: qty 1

## 2016-12-29 NOTE — ED Provider Notes (Signed)
[facility] DEPT Provider Note   CSN: [id number] Arrival date & time: 12/28/16  2317     History   Chief Complaint Chief Complaint  Patient presents with  . Constipation    HPI Gloria Calderon is a 51 y.o. female.  Patient presents with intermittent worsening constipation symptoms and past 2-3 days. Last bowel movement partially 3 days ago. Patient's had intermittent issues with constipation since she was young and worsened with all her medications including narcotics. No focal abdominal pain. No fevers or vomiting. Patient has tried multiple different attempts including enema.      Past Medical History:  Diagnosis Date  . COPD (chronic obstructive pulmonary disease) [facility])     Patient Active Problem List   Diagnosis Date Noted  . COPD with exacerbation ([place]) 07/29/2015  . Tobacco abuse 07/29/2015  . COPD exacerbation ([place]) 07/29/2015  . Influenza with respiratory manifestations 10/11/2013  . COPD (chronic obstructive pulmonary disease) ([place]) 10/11/2013  . Thrush 10/11/2013    Past Surgical History:  Procedure Laterality Date  . APPENDECTOMY    . CHOLECYSTECTOMY      OB History    Gravida Para Term Preterm AB Living   1 1 1     1    SAB TAB Ectopic Multiple Live Births                   Home Medications    Prior to Admission medications   Medication Sig Start Date End Date Taking? Authorizing Provider  oxyCODONE (ROXICODONE) 15 MG immediate release tablet Take 15 mg by mouth.   Yes Historical Provider, MD  albuterol (PROVENTIL HFA;VENTOLIN HFA) 108 (90 BASE) MCG/ACT inhaler Inhale 2 puffs into the lungs every 6 (six) hours as needed for wheezing or shortness of breath.    Historical Provider, MD  albuterol (PROVENTIL) (2.5 MG/3ML) 0.083% nebulizer solution INHALE CONTENTS OF ONE VIAL BY NEBULIZATION EVERY 6 HOURS AS NEEDED FOR WHEEZING OR SHORTNESS OF BREATH 04/16/15   Collene Gobble, MD  ALPRAZolam Duanne Moron) 1 MG tablet Take 2 tablets by mouth 3 (three)  times daily as needed for anxiety.     Historical Provider, MD  bisacodyl (DULCOLAX) 5 MG EC tablet Take 5-15 mg by mouth daily as needed for mild constipation.     Historical Provider, MD  budesonide-formoterol (SYMBICORT) 160-4.5 MCG/ACT inhaler Inhale 2 puffs into the lungs 2 (two) times daily. 08/01/15   Erline Hau, MD  cyclobenzaprine (FLEXERIL) 10 MG tablet Take 1 tablet by mouth as needed for muscle spasms.     Historical Provider, MD  docusate sodium (COLACE) 100 MG capsule Take 1 capsule (100 mg total) by mouth 2 (two) times daily as needed for mild constipation or moderate constipation. 12/29/16   Elnora Morrison, MD  ENDOCET 10-325 MG per tablet Take 1 tablet by mouth 2 (two) times daily as needed for pain.  09/24/13   Historical Provider, MD  fluticasone (FLONASE) 50 MCG/ACT nasal spray Place 1 spray into both nostrils daily.    Historical Provider, MD  guaifenesin (MUCUS RELIEF) 400 MG TABS tablet Take 400 mg by mouth every 6 (six) hours as needed (for congestion).    Historical Provider, MD  levofloxacin (LEVAQUIN) 500 MG tablet Take 1 tablet (500 mg total) by mouth daily. 08/01/15   Erline Hau, MD  magnesium citrate SOLN Take 296 mLs (1 Bottle total) by mouth once. 12/29/16 12/29/16  Elnora Morrison, MD  Multiple Vitamins-Minerals (MULTIVITAMIN & MINERAL PO) Take 1  tablet by mouth daily.    Historical Provider, MD  pantoprazole (PROTONIX) 40 MG tablet Take 40 mg by mouth daily. 06/24/15   Historical Provider, MD  predniSONE (DELTASONE) 10 MG tablet Take 1 tablet (10 mg total) by mouth daily with breakfast. Take 6 tablets today and then decrease by 1 tablet daily until none are left. 08/01/15   Erline Hau, MD  Probiotic Product (META BIOTIC/BIO-ACTIVE 12 PO) Take 1 tablet by mouth daily.    Historical Provider, MD  tiotropium (SPIRIVA) 18 MCG inhalation capsule Place 1 capsule (18 mcg total) into inhaler and inhale daily. 08/01/15   Erline Hau, MD  zinc gluconate 50 MG tablet Take 50 mg by mouth daily.    Historical Provider, MD    Family History Family History  Problem Relation Age of Onset  . COPD Father     Social History Social History  Substance Use Topics  . Smoking status: Current Every Day Smoker    Packs/day: 1.00    Years: 30.00    Types: Cigarettes  . Smokeless tobacco: Never Used  . Alcohol use No     Allergies   Levaquin [levofloxacin] and Morphine and related   Review of Systems Review of Systems  Constitutional: Negative for chills and fever.  Respiratory: Negative for shortness of breath.   Cardiovascular: Negative for chest pain.  Gastrointestinal: Positive for abdominal pain and constipation. Negative for vomiting.  Genitourinary: Negative for dysuria and flank pain.  Musculoskeletal: Negative for back pain, neck pain and neck stiffness.  Skin: Negative for rash.  Neurological: Negative for light-headedness and headaches.     Physical Exam Updated Vital Signs BP 123/85   Pulse 99   Temp 98 F (36.7 C) (Oral)   Resp 17   Ht 5\' 4"  (1.626 m)   Wt 128 lb (58.1 kg)   SpO2 95%   BMI 21.97 kg/m   Physical Exam  Constitutional: She is oriented to person, place, and time. She appears well-developed and well-nourished.  HENT:  Head: Normocephalic and atraumatic.  Eyes: Conjunctivae are normal. Right eye exhibits no discharge. Left eye exhibits no discharge.  Neck: Normal range of motion. Neck supple. No tracheal deviation present.  Cardiovascular: Normal rate and regular rhythm.   Pulmonary/Chest: Effort normal and breath sounds normal.  Abdominal: Soft. She exhibits no distension. There is tenderness (mild left lower). There is no guarding.  Musculoskeletal: She exhibits no edema.  Neurological: She is alert and oriented to person, place, and time.  Skin: Skin is warm. No rash noted.  Psychiatric: She has a normal mood and affect.  Nursing note and vitals reviewed.    ED  Treatments / Results  Labs (all labs ordered are listed, but only abnormal results are displayed) Labs Reviewed - No data to display  EKG  EKG Interpretation None       Radiology No results found.  Procedures Procedures (including critical care time)  Medications Ordered in ED Medications  magnesium citrate solution 1 Bottle (1 Bottle Oral Given 12/29/16 0222)  docusate sodium (COLACE) capsule 100 mg (100 mg Oral Given 12/29/16 0301)     Initial Impression / Assessment and Plan / ED Course  I have reviewed the triage vital signs and the nursing notes.  Pertinent labs & imaging results that were available during my care of the patient were reviewed by me and considered in my medical decision making (see chart for details).    Patient presents with clinically  constipation similar to previous likely due to narcotics. Bowel regimen given in the ER, patient had a bowel movement. Outpatient follow.  Results and differential diagnosis were discussed with the patient/parent/guardian. Xrays were independently reviewed by myself.  Close follow up outpatient was discussed, comfortable with the plan.   Medications  magnesium citrate solution 1 Bottle (1 Bottle Oral Given 12/29/16 0222)  docusate sodium (COLACE) capsule 100 mg (100 mg Oral Given 12/29/16 0301)    Vitals:   12/28/16 2322 12/29/16 0305  BP: (!) 149/116 123/85  Pulse: (!) 105 99  Resp: 20 17  Temp: 98 F (36.7 C)   TempSrc: Oral   SpO2: 96% 95%  Weight: 128 lb (58.1 kg)   Height: 5\' 4"  (1.626 m)     Final diagnoses:  Constipation, unspecified constipation type     Final Clinical Impressions(s) / ED Diagnoses   Final diagnoses:  Constipation, unspecified constipation type    New Prescriptions New Prescriptions   DOCUSATE SODIUM (COLACE) 100 MG CAPSULE    Take 1 capsule (100 mg total) by mouth 2 (two) times daily as needed for mild constipation or moderate constipation.   MAGNESIUM CITRATE SOLN    Take  296 mLs (1 Bottle total) by mouth once.     Elnora Morrison, MD 12/29/16 [phone]

## 2016-12-29 NOTE — Discharge Instructions (Signed)
Return for vomiting, fevers, worsening discomfort. If you develop diarrhea hold constipation medicines.  If you were given medicines take as directed.  If you are on coumadin or contraceptives realize their levels and effectiveness is altered by many different medicines.  If you have any reaction (rash, tongues swelling, other) to the medicines stop taking and see a physician.    If your blood pressure was elevated in the ER make sure you follow up for management with a primary doctor or return for chest pain, shortness of breath or stroke symptoms.  Please follow up as directed and return to the ER or see a physician for new or worsening symptoms.  Thank you. Vitals:   12/28/16 2322  BP: (!) 149/116  Pulse: (!) 105  Resp: 20  Temp: 98 F (36.7 C)  TempSrc: Oral  SpO2: 96%  Weight: 128 lb (58.1 kg)  Height: 5\' 4"  (1.626 m)

## 2016-12-29 NOTE — ED Notes (Signed)
Fleets mineral enema administered- bedside toilet placed in room beside pt bed.

## 2017-03-26 ENCOUNTER — Encounter (INDEPENDENT_AMBULATORY_CARE_PROVIDER_SITE_OTHER): Payer: Self-pay | Admitting: Internal Medicine

## 2017-04-02 ENCOUNTER — Encounter (INDEPENDENT_AMBULATORY_CARE_PROVIDER_SITE_OTHER): Payer: Self-pay | Admitting: Internal Medicine

## 2017-04-02 ENCOUNTER — Encounter (INDEPENDENT_AMBULATORY_CARE_PROVIDER_SITE_OTHER): Payer: Self-pay

## 2017-04-02 ENCOUNTER — Ambulatory Visit (INDEPENDENT_AMBULATORY_CARE_PROVIDER_SITE_OTHER): Payer: Self-pay | Admitting: Internal Medicine

## 2017-04-02 VITALS — BP 118/72 | HR 74 | Temp 98.1°F | Resp 16 | Ht 63.0 in | Wt 127.1 lb

## 2017-04-02 DIAGNOSIS — K5909 Other constipation: Secondary | ICD-10-CM

## 2017-04-02 DIAGNOSIS — K5900 Constipation, unspecified: Secondary | ICD-10-CM | POA: Insufficient documentation

## 2017-04-02 HISTORY — DX: Constipation, unspecified: K59.00

## 2017-04-02 MED ORDER — LACTULOSE 10 GM/15ML PO SOLN: 10.0000 g | Freq: Three times a day (TID) | ORAL | 5 refills | Status: DC

## 2017-04-02 NOTE — Patient Instructions (Signed)
Rx for Lactulose to her pharmacy.  Samples of Miralax given to patient

## 2017-04-02 NOTE — Progress Notes (Signed)
Subjective:    Patient ID: Gloria Calderon, female    DOB: [birth year], 51 y.o.   MRN: [id number]  HPI Referred by Dr. Melina Copa for constipation/screening colonoscopy.  Has tried Land but did not help with her constipation. She has also tried Linzess which did not help. She also has tried Amitiza which did not help.  She tells me she takes a laxative about twice a day as needed. She takes a Probiotic daily and Colon Clenz which has not helped.  She says she has a BM about twice a week. She says when she eats, it feels like foods are sitting in her epigastric region.  She takes Oxycodone 15mg  daily  for back and neck pain.  For the most part, her appetite is not good. She says she has lost about 25 pounds in the past 6 months. 03/02/2017 H and H 1.0 and 51.5  Review of Systems . Past Medical History:  Diagnosis Date  . Constipation 04/02/2017  . COPD (chronic obstructive pulmonary disease) ([place])     Past Surgical History:  Procedure Laterality Date  . APPENDECTOMY    . CHOLECYSTECTOMY      Allergies  Allergen Reactions  . Levaquin [Levofloxacin]     hives  . Morphine And Related Itching and Nausea And Vomiting    Current Outpatient Prescriptions on File Prior to Visit  Medication Sig Dispense Refill  . albuterol (PROVENTIL HFA;VENTOLIN HFA) 108 (90 BASE) MCG/ACT inhaler Inhale 2 puffs into the lungs every 6 (six) hours as needed for wheezing or shortness of breath.    Marland Kitchen albuterol (PROVENTIL) (2.5 MG/3ML) 0.083% nebulizer solution INHALE CONTENTS OF ONE VIAL BY NEBULIZATION EVERY 6 HOURS AS NEEDED FOR WHEEZING OR SHORTNESS OF BREATH 75 mL 5  . ALPRAZolam (XANAX) 1 MG tablet Take 2 tablets by mouth 3 (three) times daily as needed for anxiety.     . bisacodyl (DULCOLAX) 5 MG EC tablet Take 5-15 mg by mouth daily as needed for mild constipation.     . budesonide-formoterol (SYMBICORT) 160-4.5 MCG/ACT inhaler Inhale 2 puffs into the lungs 2 (two) times daily. 1  Inhaler 12  . cyclobenzaprine (FLEXERIL) 10 MG tablet Take 1 tablet by mouth as needed for muscle spasms.     Marland Kitchen docusate sodium (COLACE) 100 MG capsule Take 1 capsule (100 mg total) by mouth 2 (two) times daily as needed for mild constipation or moderate constipation. 20 capsule 0  . ENDOCET 10-325 MG per tablet Take 1 tablet by mouth 2 (two) times daily as needed for pain.     . fluticasone (FLONASE) 50 MCG/ACT nasal spray Place 1 spray into both nostrils daily.    Marland Kitchen guaifenesin (MUCUS RELIEF) 400 MG TABS tablet Take 400 mg by mouth every 6 (six) hours as needed (for congestion).    Marland Kitchen levofloxacin (LEVAQUIN) 500 MG tablet Take 1 tablet (500 mg total) by mouth daily. 5 tablet 0  . Multiple Vitamins-Minerals (MULTIVITAMIN & MINERAL PO) Take 1 tablet by mouth daily.    Marland Kitchen oxyCODONE (ROXICODONE) 15 MG immediate release tablet Take 15 mg by mouth.    . pantoprazole (PROTONIX) 40 MG tablet Take 40 mg by mouth daily.  0  . predniSONE (DELTASONE) 10 MG tablet Take 1 tablet (10 mg total) by mouth daily with breakfast. Take 6 tablets today and then decrease by 1 tablet daily until none are left. 21 tablet 0  . Probiotic Product (META BIOTIC/BIO-ACTIVE 12 PO) Take 1 tablet by mouth daily.    Marland Kitchen  tiotropium (SPIRIVA) 18 MCG inhalation capsule Place 1 capsule (18 mcg total) into inhaler and inhale daily. 30 capsule 12  . zinc gluconate 50 MG tablet Take 50 mg by mouth daily.     No current facility-administered medications on file prior to visit.         Objective:   Physical Exam Blood pressure 118/72, pulse 74, temperature 98.1 F (36.7 C), resp. rate 16, height 5\' 3"  (1.6 m), weight 127 lb 1.6 oz (57.7 kg).  Alert and oriented. Skin warm and dry. Oral mucosa is moist.   . Sclera anicteric, conjunctivae is pink. Thyroid not enlarged. No cervical lymphadenopathy. Lungs clear. Heart regular rate and rhythm.  Abdomen is soft. Bowel sounds are positive. No hepatomegaly. No abdominal masses felt. No  tenderness.  No edema to lower extremities.          Assessment & Plan:  Constipation: Am going to try her on Lactulose TID. Samples of MIralax given to patient x 5 doses. Will discuss colonoscopy at next OV.  She does not have insurance at this time.  OV in 3 months.  She will call with a PR in 2 weeks.

## 2017-04-03 ENCOUNTER — Encounter (INDEPENDENT_AMBULATORY_CARE_PROVIDER_SITE_OTHER): Payer: Self-pay

## 2017-04-23 ENCOUNTER — Telehealth (INDEPENDENT_AMBULATORY_CARE_PROVIDER_SITE_OTHER): Payer: Self-pay | Admitting: Internal Medicine

## 2017-04-23 NOTE — Telephone Encounter (Signed)
Patient called, stated that she saw you on July 19 and the medication you prescribed has not helped at all.  She has discontinued it and has back to her laxative regimen.  If you need to speak to her, call her at the number below.  [phone]

## 2017-07-03 ENCOUNTER — Ambulatory Visit (INDEPENDENT_AMBULATORY_CARE_PROVIDER_SITE_OTHER): Payer: Self-pay | Admitting: Internal Medicine

## 2017-07-20 ENCOUNTER — Inpatient Hospital Stay (HOSPITAL_COMMUNITY)
Admission: EM | Admit: 2017-07-20 | Discharge: 2017-07-24 | DRG: 190 | Disposition: A | Discharge status: Home / Self Care | Payer: Medicaid Other | Attending: Internal Medicine | Admitting: Internal Medicine

## 2017-07-20 ENCOUNTER — Encounter (HOSPITAL_COMMUNITY): Payer: Self-pay | Admitting: Emergency Medicine

## 2017-07-20 ENCOUNTER — Emergency Department (HOSPITAL_COMMUNITY): Payer: Medicaid Other

## 2017-07-20 DIAGNOSIS — M4800 Spinal stenosis, site unspecified: Secondary | ICD-10-CM | POA: Diagnosis present

## 2017-07-20 DIAGNOSIS — Z79890 Hormone replacement therapy (postmenopausal): Secondary | ICD-10-CM

## 2017-07-20 DIAGNOSIS — Z7951 Long term (current) use of inhaled steroids: Secondary | ICD-10-CM

## 2017-07-20 DIAGNOSIS — R0602 Shortness of breath: Secondary | ICD-10-CM

## 2017-07-20 DIAGNOSIS — D751 Secondary polycythemia: Secondary | ICD-10-CM | POA: Diagnosis present

## 2017-07-20 DIAGNOSIS — Z79899 Other long term (current) drug therapy: Secondary | ICD-10-CM

## 2017-07-20 DIAGNOSIS — J9601 Acute respiratory failure with hypoxia: Secondary | ICD-10-CM | POA: Diagnosis present

## 2017-07-20 DIAGNOSIS — Z885 Allergy status to narcotic agent status: Secondary | ICD-10-CM

## 2017-07-20 DIAGNOSIS — R0902 Hypoxemia: Secondary | ICD-10-CM

## 2017-07-20 DIAGNOSIS — Z881 Allergy status to other antibiotic agents status: Secondary | ICD-10-CM

## 2017-07-20 DIAGNOSIS — E039 Hypothyroidism, unspecified: Secondary | ICD-10-CM | POA: Diagnosis present

## 2017-07-20 DIAGNOSIS — B370 Candidal stomatitis: Secondary | ICD-10-CM | POA: Diagnosis present

## 2017-07-20 DIAGNOSIS — Z825 Family history of asthma and other chronic lower respiratory diseases: Secondary | ICD-10-CM

## 2017-07-20 DIAGNOSIS — Z720 Tobacco use: Secondary | ICD-10-CM | POA: Diagnosis present

## 2017-07-20 DIAGNOSIS — K5900 Constipation, unspecified: Secondary | ICD-10-CM | POA: Diagnosis present

## 2017-07-20 DIAGNOSIS — Z23 Encounter for immunization: Secondary | ICD-10-CM

## 2017-07-20 DIAGNOSIS — J441 Chronic obstructive pulmonary disease with (acute) exacerbation: Principal | ICD-10-CM | POA: Diagnosis present

## 2017-07-20 DIAGNOSIS — Z79891 Long term (current) use of opiate analgesic: Secondary | ICD-10-CM

## 2017-07-20 DIAGNOSIS — F17210 Nicotine dependence, cigarettes, uncomplicated: Secondary | ICD-10-CM | POA: Diagnosis present

## 2017-07-20 HISTORY — DX: Other chronic pain: G89.29

## 2017-07-20 HISTORY — DX: Hypothyroidism, unspecified: E03.9

## 2017-07-20 HISTORY — DX: Spinal stenosis, site unspecified: M48.00

## 2017-07-20 HISTORY — DX: Age-related osteoporosis without current pathological fracture: M81.0

## 2017-07-20 HISTORY — DX: Dorsalgia, unspecified: M54.9

## 2017-07-20 LAB — CBC WITH DIFFERENTIAL/PLATELET
BASOS PCT: 0 %
Basophils Absolute: 0 10*3/uL (ref 0.0–0.1)
EOS PCT: 0 %
Eosinophils Absolute: 0 10*3/uL (ref 0.0–0.7)
HEMATOCRIT: 49.1 % — AB (ref 36.0–46.0)
Hemoglobin: 15.9 g/dL — ABNORMAL HIGH (ref 12.0–15.0)
LYMPHS ABS: 2.1 10*3/uL (ref 0.7–4.0)
Lymphocytes Relative: 20 %
MCH: 33.5 pg (ref 26.0–34.0)
MCHC: 32.4 g/dL (ref 30.0–36.0)
MCV: 103.4 fL — AB (ref 78.0–100.0)
MONOS PCT: 10 %
Monocytes Absolute: 1.1 10*3/uL — ABNORMAL HIGH (ref 0.1–1.0)
Neutro Abs: 7.5 10*3/uL (ref 1.7–7.7)
Neutrophils Relative %: 70 %
Platelets: 260 10*3/uL (ref 150–400)
RBC: 4.75 MIL/uL (ref 3.87–5.11)
RDW: 13.2 % (ref 11.5–15.5)
WBC: 10.7 10*3/uL — ABNORMAL HIGH (ref 4.0–10.5)

## 2017-07-20 LAB — BASIC METABOLIC PANEL
Anion gap: 9 (ref 5–15)
BUN: 14 mg/dL (ref 6–20)
CHLORIDE: 99 mmol/L — AB (ref 101–111)
CO2: 32 mmol/L (ref 22–32)
CREATININE: 0.79 mg/dL (ref 0.44–1.00)
Calcium: 8.5 mg/dL — ABNORMAL LOW (ref 8.9–10.3)
GFR calc Af Amer: >60 mL/min (ref >60)
GFR calc non Af Amer: >60 mL/min (ref >60)
Glucose, Bld: 101 mg/dL — ABNORMAL HIGH (ref 65–99)
Potassium: 4.4 mmol/L (ref 3.5–5.1)
SODIUM: 140 mmol/L (ref 135–145)

## 2017-07-20 LAB — TROPONIN I

## 2017-07-20 MED ORDER — CYCLOBENZAPRINE HCL 10 MG PO TABS
10.0000 mg | ORAL_TABLET | Freq: Every day | ORAL | Status: DC
  Administered 2017-07-21 – 2017-07-23 (×4): 10 mg via ORAL
  Filled 2017-07-20 (×4): qty 1

## 2017-07-20 MED ORDER — METHYLPREDNISOLONE SODIUM SUCC 125 MG IJ SOLR
125.0000 mg | Freq: Once | INTRAMUSCULAR | Status: AC
  Administered 2017-07-20: 125 mg via INTRAVENOUS
  Filled 2017-07-20: qty 2

## 2017-07-20 MED ORDER — IPRATROPIUM BROMIDE 0.02 % IN SOLN: 0.5000 mg | Freq: Four times a day (QID) | RESPIRATORY_TRACT | Status: DC

## 2017-07-20 MED ORDER — SODIUM CHLORIDE 0.9 % IV SOLN
INTRAVENOUS | Status: AC
  Administered 2017-07-21 (×2): via INTRAVENOUS

## 2017-07-20 MED ORDER — OXYCODONE HCL 5 MG PO TABS
15.0000 mg | ORAL_TABLET | Freq: Four times a day (QID) | ORAL | Status: DC | PRN
  Administered 2017-07-21 – 2017-07-24 (×7): 15 mg via ORAL
  Filled 2017-07-20 (×7): qty 3

## 2017-07-20 MED ORDER — DOCUSATE SODIUM 100 MG PO CAPS
100.0000 mg | ORAL_CAPSULE | Freq: Two times a day (BID) | ORAL | Status: DC
  Administered 2017-07-21 – 2017-07-22 (×4): 100 mg via ORAL
  Filled 2017-07-20 (×4): qty 1

## 2017-07-20 MED ORDER — ALBUTEROL SULFATE (2.5 MG/3ML) 0.083% IN NEBU: 2.5000 mg | INHALATION_SOLUTION | RESPIRATORY_TRACT | Status: DC | PRN

## 2017-07-20 MED ORDER — ONDANSETRON HCL 4 MG/2ML IJ SOLN: 4.0000 mg | Freq: Four times a day (QID) | INTRAMUSCULAR | Status: DC | PRN

## 2017-07-20 MED ORDER — ONDANSETRON HCL 4 MG PO TABS: 4.0000 mg | ORAL_TABLET | Freq: Four times a day (QID) | ORAL | Status: DC | PRN

## 2017-07-20 MED ORDER — ALBUTEROL (5 MG/ML) CONTINUOUS INHALATION SOLN
10.0000 mg/h | INHALATION_SOLUTION | Freq: Once | RESPIRATORY_TRACT | Status: AC
  Administered 2017-07-20: 10 mg/h via RESPIRATORY_TRACT
  Filled 2017-07-20: qty 20

## 2017-07-20 MED ORDER — LEVOTHYROXINE SODIUM 25 MCG PO TABS
25.0000 ug | ORAL_TABLET | Freq: Every day | ORAL | Status: DC
  Administered 2017-07-21 – 2017-07-24 (×4): 25 ug via ORAL
  Filled 2017-07-20 (×4): qty 1

## 2017-07-20 MED ORDER — PANTOPRAZOLE SODIUM 40 MG PO TBEC
40.0000 mg | DELAYED_RELEASE_TABLET | Freq: Every day | ORAL | Status: DC
  Administered 2017-07-21 – 2017-07-24 (×4): 40 mg via ORAL
  Filled 2017-07-20 (×4): qty 1

## 2017-07-20 MED ORDER — ENOXAPARIN SODIUM 40 MG/0.4ML ~~LOC~~ SOLN
40.0000 mg | SUBCUTANEOUS | Status: DC
  Administered 2017-07-21 – 2017-07-23 (×4): 40 mg via SUBCUTANEOUS
  Filled 2017-07-20 (×4): qty 0.4

## 2017-07-20 MED ORDER — IPRATROPIUM BROMIDE 0.02 % IN SOLN
1.0000 mg | Freq: Once | RESPIRATORY_TRACT | Status: AC
  Administered 2017-07-20: 1 mg via RESPIRATORY_TRACT
  Filled 2017-07-20: qty 5

## 2017-07-20 MED ORDER — ALBUTEROL SULFATE (2.5 MG/3ML) 0.083% IN NEBU: 2.5000 mg | INHALATION_SOLUTION | Freq: Four times a day (QID) | RESPIRATORY_TRACT | Status: DC

## 2017-07-20 MED ORDER — ALPRAZOLAM 1 MG PO TABS
1.0000 mg | ORAL_TABLET | ORAL | Status: DC | PRN
  Administered 2017-07-21 – 2017-07-23 (×7): 1 mg via ORAL
  Filled 2017-07-20 (×7): qty 1

## 2017-07-20 MED ORDER — NICOTINE 21 MG/24HR TD PT24
21.0000 mg | MEDICATED_PATCH | Freq: Every day | TRANSDERMAL | Status: DC | PRN
  Administered 2017-07-21 – 2017-07-22 (×2): 21 mg via TRANSDERMAL
  Filled 2017-07-20 (×2): qty 1

## 2017-07-20 NOTE — H&P (Signed)
History and Physical    Gloria Calderon WEX:[id number] DOB: Jan 12, 1966 DOA: 07/20/2017  PCP: Gloria Graves, DO   Patient coming from: Home.  I have personally briefly reviewed patient's old medical records in [place]  Chief Complaint: Shortness of breath and cough.  HPI: Gloria Calderon is a 51 y.o. female with medical history significant of constipation, spinal stenosis, tobacco use, emphysema who is coming to the emergency department with progressively worsening dyspnea associated with productive yellowish sputum despite taking an antibiotic and prednisone called in by her PCP about a week ago.  No known sick contacts or travel history.  She denies fever, chills, night sweats, palpitations, chest pain, dizziness, abdominal pain, nausea, emesis, diarrhea, melena or hematochezia.  The patient states that she has frequent constipation.  No dysuria, frequency or hematuria.  ED Course: Initial vital signs temperature 98.20F, pulse 101, respirations 20, blood pressure 128/88 mmHg and O2 sat 94% on room air.  The patient received supplemental oxygen, bronchodilators and Solu-Medrol in the emergency department.  Workup shows EKG with normal sinus rhythm baseline wander, negative troponin.  WBC 10.7, hemoglobin 15.9 g/dL, MCV was 103.4 and platelets 260.  Her BMP shows CO2 of 32 mmol/L and a nonfasting glucose of 101 mg/dL, but was otherwise normal.  Her chest radiograph showed emphysema, but no acute cardiopulmonary pathology.  Review of Systems: As per HPI otherwise 10 point review of systems negative.    Past Medical History:  Diagnosis Date  . Constipation 04/02/2017  . COPD (chronic obstructive pulmonary disease) ([place])   . Spinal stenosis     Past Surgical History:  Procedure Laterality Date  . APPENDECTOMY    . CHOLECYSTECTOMY       reports that she has been smoking cigarettes.  She has a 30.00 pack-year smoking history. she has never used smokeless tobacco. She  reports that she does not drink alcohol or use drugs.  Allergies  Allergen Reactions  . Levaquin [Levofloxacin]     hives  . Morphine And Related Itching and Nausea And Vomiting    Family History  Problem Relation Age of Onset  . COPD Father     Prior to Admission medications   Medication Sig Start Date End Date Taking? Authorizing Provider  albuterol (PROVENTIL HFA;VENTOLIN HFA) 108 (90 BASE) MCG/ACT inhaler Inhale 2 puffs into the lungs every 6 (six) hours as needed for wheezing or shortness of breath.   Yes [provider]  albuterol (PROVENTIL) (2.5 MG/3ML) 0.083% nebulizer solution INHALE CONTENTS OF ONE VIAL BY NEBULIZATION EVERY 6 HOURS AS NEEDED FOR WHEEZING OR SHORTNESS OF BREATH 04/16/15  Yes Byrum, Rose Fillers, MD  alendronate (FOSAMAX) 70 MG tablet TAKE ONE TABLET BY MOUTH EVERY MONDAY 03/27/17  Yes [provider]  ALPRAZolam Duanne Moron) 1 MG tablet Take 2 tablets by mouth 3 (three) times daily as needed for anxiety.    Yes [provider]  Aspirin-Salicylamide-Caffeine (BC HEADACHE) 325-95-16 MG TABS Take 2 packets daily as needed by mouth (FOR PAIN).   Yes [provider]  budesonide-formoterol (SYMBICORT) 160-4.5 MCG/ACT inhaler Inhale 2 puffs into the lungs 2 (two) times daily. 08/01/15  Yes Isaac Bliss, Rayford Halsted, MD  clarithromycin (BIAXIN) 500 MG tablet Take 500 mg 2 (two) times daily by mouth. 7 day course starting on 07/21/17 07/15/17  Yes [provider]  [employer] FIBER GUMMIES PO Take 2 each daily by mouth.   Yes [provider]  cyclobenzaprine (FLEXERIL) 10 MG tablet Take 20 mg at  bedtime by mouth.    Yes [provider]  docusate sodium (COLACE) 100 MG capsule Take 1 capsule (100 mg total) by mouth 2 (two) times daily as needed for mild constipation or moderate constipation. Patient taking differently: Take 100 mg 2 (two) times daily by mouth.  12/29/16  Yes Elnora Morrison, MD  esomeprazole (NEXIUM) 40 MG capsule  Take 20-40 mg daily by mouth.  03/06/17  Yes [provider]  fluticasone (FLONASE) 50 MCG/ACT nasal spray Place 1 spray daily as needed into both nostrils for allergies or rhinitis.    Yes [provider]  guaiFENesin-codeine 100-10 MG/5ML syrup Take 5 mLs every 4 (four) hours as needed by mouth. 12 day course 07/15/17  Yes [provider]  levothyroxine (SYNTHROID, LEVOTHROID) 25 MCG tablet Take 1 Tablet by mouth every morning ON an EMPTY stomach 03/07/17  Yes [provider]  Multiple Vitamins-Minerals (MULTIVITAMIN & MINERAL PO) Take 1 tablet by mouth daily.   Yes [provider]  oxyCODONE (ROXICODONE) 15 MG immediate release tablet Take 15 mg 4 (four) times daily by mouth.   Yes [provider]  Probiotic Product (META BIOTIC/BIO-ACTIVE 12 PO) Take 2 each daily by mouth. [place]   Yes [provider]  tiotropium (SPIRIVA) 18 MCG inhalation capsule Place 1 capsule (18 mcg total) into inhaler and inhale daily. 08/01/15  Yes Isaac Bliss, Rayford Halsted, MD  lactulose (CHRONULAC) 10 GM/15ML solution Take 15 mLs (10 g total) by mouth 3 (three) times daily. Patient not taking: Reported on 07/20/2017 04/02/17   Butch Penny, NP  predniSONE (DELTASONE) 20 MG tablet Take the following numbers of tablets on sequential days 6,5,4,3,2,1, stop 07/14/17   [provider]    Physical Exam: Vitals:   07/20/17 1845 07/20/17 1954 07/20/17 2130  BP: 128/85 137/84 133/75  Pulse: (!) 101 96 95  Resp: 20 (!) 24 16  Temp: 98.6 F (37 C) 97.8 F (36.6 C)   TempSrc: Oral    SpO2: 94% 91% 92%  Weight: 59.4 kg (131 lb)    Height: 5\' 4"  (1.626 m)      Constitutional: NAD, calm, comfortable Eyes: PERRL, lids and conjunctivae normal ENMT: Mucous membranes are moist.  Positive oral thrush, particularly on glossal dorsal aspect. Neck: normal, supple, no masses, no thyromegaly Respiratory: Decreased breath sounds  with wheezing and rhonchi bilaterally, no crackles. Normal respiratory effort. No accessory muscle use.  Cardiovascular: Regular rate and rhythm, no murmurs / rubs / gallops. No extremity edema. 2+ pedal pulses. No carotid bruits.  Abdomen: no tenderness, no masses palpated. No hepatosplenomegaly. Bowel sounds positive.  Musculoskeletal: no clubbing / cyanosis. Good ROM, no contractures. Normal muscle tone.  Skin: no significant rashes, lesions, ulcers on limited skin exam. Neurologic: CN 2-12 grossly intact. Sensation intact, DTR normal. Strength 5/5 in all 4.  Psychiatric: Normal judgment and insight. Alert and oriented x 4. Normal mood.    Labs on Admission: I have personally reviewed following labs and imaging studies  CBC: Recent Labs  Lab 07/20/17 2033  WBC 10.7*  NEUTROABS 7.5  HGB 15.9*  HCT 49.1*  MCV 103.4*  PLT 433   Basic Metabolic Panel: Recent Labs  Lab 07/20/17 2033  NA 140  K 4.4  CL 99*  CO2 32  GLUCOSE 101*  BUN 14  CREATININE 0.79  CALCIUM 8.5*   GFR: Estimated Creatinine Clearance: 71.8 mL/min (by C-G formula based on SCr of 0.79 mg/dL). Liver Function Tests: No results for  input(s): AST, ALT, ALKPHOS, BILITOT, PROT, ALBUMIN in the last 168 hours. No results for input(s): LIPASE, AMYLASE in the last 168 hours. No results for input(s): AMMONIA in the last 168 hours. Coagulation Profile: No results for input(s): INR, PROTIME in the last 168 hours. Cardiac Enzymes: Recent Labs  Lab 07/20/17 2033  TROPONINI <0.03   BNP (last 3 results) No results for input(s): PROBNP in the last 8760 hours. HbA1C: No results for input(s): HGBA1C in the last 72 hours. CBG: No results for input(s): GLUCAP in the last 168 hours. Lipid Profile: No results for input(s): CHOL, HDL, LDLCALC, TRIG, CHOLHDL, LDLDIRECT in the last 72 hours. Thyroid Function Tests: No results for input(s): TSH, T4TOTAL, FREET4, T3FREE, THYROIDAB in the last 72 hours. Anemia Panel: No  results for input(s): VITAMINB12, FOLATE, FERRITIN, TIBC, IRON, RETICCTPCT in the last 72 hours. Urine analysis: No results found for: COLORURINE, APPEARANCEUR, LABSPEC, [place], GLUCOSEU, [place], [place], [place], [place], [place], NITRITE, LEUKOCYTESUR  Radiological Exams on Admission: Dg Chest 2 View  Result Date: 07/20/2017 CLINICAL DATA:  Productive cough that has been unresponsive to antibiotic therapy and a 1 week course of corticosteroid therapy. EXAM: CHEST  2 VIEW COMPARISON:  07/29/2015, 10/03/2013. FINDINGS: Cardiomediastinal silhouette unremarkable, unchanged. Hyperinflation with emphysematous changes throughout both lungs, predominantly the upper lobes, unchanged. Prominent bronchovascular markings diffusely and moderate central peribronchial thickening, unchanged. Lungs otherwise clear. No localized airspace consolidation. No pleural effusions. No pneumothorax. Normal pulmonary vascularity. Degenerative changes involving the thoracic spine. IMPRESSION: 1.  No acute cardiopulmonary disease. 2.  Emphysema (ICD10-J43.9). Electronically Signed   By: Evangeline Dakin M.D.   On: 07/20/2017 19:29    EKG: Independently reviewed. Vent. rate 95 BPM PR interval * ms QRS duration 77 ms QT/QTc 349/439 ms P-R-T axes 92 87 87 Sinus rhythm Baseline wander  Assessment/Plan Principal Problem:   COPD with acute exacerbation (HCC) Observation/telemetry. Continue supplemental oxygen. Continue schedule and as needed bronchodilators. Received methylprednisolone 125 mg IVP x1. Will evaluate in a.m. further need for glucocorticoids. Smoking cessation importance stressed.  Active Problems:   Thrush Per patient, she gets thrush when she is on steroids and antibiotics. Nystatin swish and swallow ordered.    Tobacco abuse Nicotine replacement therapy ordered. Staff to provide smoking cessation information.    Polycythemia Likely secondary to cigarette smoking. Advised to  consider ceasing tobacco use or at least using vaporizers instead of smoking cigarettes. Will start aspirin 81 mg p.o. daily. Monitor hematocrit and hemoglobin.     DVT prophylaxis: Lovenox SQ Code Status: Full code Family Communication:  Disposition Plan: Observation for COPD exacerbation treatment. Consults called:  Admission status: Observation/telemetry.   Reubin Milan MD [facility] Pager [phone]  If 7PM-7AM, please contact night-coverage [email or web address] Password [email or web address]  07/20/2017, 10:32 PM

## 2017-07-20 NOTE — ED Triage Notes (Signed)
PT states she has had 7 day course of prednisone finishing up today and still on an antibiotic that was prescribed by her primary doctor for URI. PT states continued SOB and discoloration and pain to her tongue. PT c/o productive yellow sputum cough.

## 2017-07-20 NOTE — ED Notes (Signed)
Begin to walk patient as instructed by Dr. And we didnt't make it but a couple steps when patient's O2 stats begin to drop in the mid 80's and patient became very short of breath and wheezing. Charge Nurse and I got patient back to bed and placed O2 back on.

## 2017-07-20 NOTE — ED Provider Notes (Signed)
[facility] EMERGENCY DEPARTMENT Provider Note   CSN: [id number] Arrival date & time: 07/20/17  1817     History   Chief Complaint Chief Complaint  Patient presents with  . Shortness of Breath    HPI Gloria Calderon is a 51 y.o. female.  HPI  Pt was seen at 2010.  Per pt, c/o gradual onset and worsening of persistent cough, wheezing and SOB for the past 1 week, worse over the past 3 days. Describes the cough as productive of "yellow" sputum.  Pt states she was evaluated by her PMD, rx prednisone and antibiotic. Has been taking the meds as prescribed, as well as using home MDI and nebs without relief.  Denies sore throat, no CP/palpitations, no back pain, no abd pain, no N/V/D, no fevers, no rash.    Past Medical History:  Diagnosis Date  . Constipation 04/02/2017  . COPD (chronic obstructive pulmonary disease) ([place])   . Spinal stenosis     Patient Active Problem List   Diagnosis Date Noted  . Constipation 04/02/2017  . COPD with exacerbation ([place]) 07/29/2015  . Tobacco abuse 07/29/2015  . COPD exacerbation ([place]) 07/29/2015  . Influenza with respiratory manifestations 10/11/2013  . COPD (chronic obstructive pulmonary disease) ([place]) 10/11/2013  . Thrush 10/11/2013    Past Surgical History:  Procedure Laterality Date  . APPENDECTOMY    . CHOLECYSTECTOMY      OB History    Gravida Para Term Preterm AB Living   1 1 1     1    SAB TAB Ectopic Multiple Live Births                   Home Medications    Prior to Admission medications   Medication Sig Start Date End Date Taking? Authorizing Provider  albuterol (PROVENTIL HFA;VENTOLIN HFA) 108 (90 BASE) MCG/ACT inhaler Inhale 2 puffs into the lungs every 6 (six) hours as needed for wheezing or shortness of breath.    [provider]  albuterol (PROVENTIL) (2.5 MG/3ML) 0.083% nebulizer solution INHALE CONTENTS OF ONE VIAL BY NEBULIZATION EVERY 6 HOURS AS NEEDED FOR WHEEZING OR SHORTNESS OF BREATH 04/16/15    Collene Gobble, MD  alendronate (FOSAMAX) 70 MG tablet take 1 Tablet each week 03/27/17   [provider]  ALPRAZolam Duanne Moron) 1 MG tablet Take 2 tablets by mouth 3 (three) times daily as needed for anxiety.     [provider]  bisacodyl (DULCOLAX) 5 MG EC tablet Take 5-15 mg by mouth daily as needed for mild constipation.     [provider]  budesonide-formoterol (SYMBICORT) 160-4.5 MCG/ACT inhaler Inhale 2 puffs into the lungs 2 (two) times daily. 08/01/15   Isaac Bliss, Rayford Halsted, MD  cyclobenzaprine (FLEXERIL) 10 MG tablet Take 1 tablet by mouth as needed for muscle spasms.     [provider]  docusate sodium (COLACE) 100 MG capsule Take 1 capsule (100 mg total) by mouth 2 (two) times daily as needed for mild constipation or moderate constipation. 12/29/16   Elnora Morrison, MD  ENDOCET 10-325 MG per tablet Take 1 tablet by mouth 2 (two) times daily as needed for pain.  09/24/13   [provider]  esomeprazole (NEXIUM) 40 MG capsule Take 40 mg by mouth daily. 03/06/17   [provider]  fluticasone (FLONASE) 50 MCG/ACT nasal spray Place 1 spray into both nostrils daily.    [provider]  guaifenesin (MUCUS RELIEF) 400 MG TABS tablet Take 400 mg  by mouth every 6 (six) hours as needed (for congestion).    [provider]  lactulose (CHRONULAC) 10 GM/15ML solution Take 15 mLs (10 g total) by mouth 3 (three) times daily. 04/02/17   Setzer, Rona Ravens, NP  levofloxacin (LEVAQUIN) 500 MG tablet Take 1 tablet (500 mg total) by mouth daily. 08/01/15   Isaac Bliss, Rayford Halsted, MD  levothyroxine (SYNTHROID, LEVOTHROID) 25 MCG tablet Take 1 Tablet by mouth every morning ON an EMPTY stomach 03/07/17   [provider]  MOVANTIK 25 MG TABS tablet Take 25 mg by mouth every morning. 03/02/17   [provider]  Multiple Vitamins-Minerals (MULTIVITAMIN & MINERAL PO) Take 1 tablet by mouth daily.    [provider]    OVER THE COUNTER MEDICATION     [provider]  oxyCODONE (ROXICODONE) 15 MG immediate release tablet Take 15 mg by mouth.    [provider]  pantoprazole (PROTONIX) 40 MG tablet Take 40 mg by mouth daily. 06/24/15   [provider]  predniSONE (DELTASONE) 10 MG tablet Take 1 tablet (10 mg total) by mouth daily with breakfast. Take 6 tablets today and then decrease by 1 tablet daily until none are left. 08/01/15   Isaac Bliss, Rayford Halsted, MD  Probiotic Product (META BIOTIC/BIO-ACTIVE 12 PO) Take 1 tablet by mouth daily.    [provider]  tiotropium (SPIRIVA) 18 MCG inhalation capsule Place 1 capsule (18 mcg total) into inhaler and inhale daily. 08/01/15   Isaac Bliss, Rayford Halsted, MD  zinc gluconate 50 MG tablet Take 50 mg by mouth daily.    [provider]    Family History Family History  Problem Relation Age of Onset  . COPD Father     Social History Social History   Tobacco Use  . Smoking status: Current Every Day Smoker    Packs/day: 1.00    Years: 30.00    Pack years: 30.00    Types: Cigarettes  . Smokeless tobacco: Never Used  Substance Use Topics  . Alcohol use: No  . Drug use: No     Allergies   Levaquin [levofloxacin] and Morphine and related   Review of Systems Review of Systems ROS: Statement: All systems negative except as marked or noted in the HPI; Constitutional: Negative for fever and chills. ; ; Eyes: Negative for eye pain, redness and discharge. ; ; ENMT: Negative for ear pain, hoarseness, nasal congestion, sinus pressure and sore throat. ; ; Cardiovascular: Negative for chest pain, palpitations, diaphoresis, and peripheral edema. ; ; Respiratory: +cough, wheezing, SOB. Negative for stridor. ; ; Gastrointestinal: Negative for nausea, vomiting, diarrhea, abdominal pain, blood in stool, hematemesis, jaundice and rectal bleeding. . ; ; Genitourinary: Negative for dysuria, flank pain and hematuria. ; ;  Musculoskeletal: Negative for back pain and neck pain. Negative for swelling and trauma.; ; Skin: Negative for pruritus, rash, abrasions, blisters, bruising and skin lesion.; ; Neuro: Negative for headache, lightheadedness and neck stiffness. Negative for weakness, altered level of consciousness, altered mental status, extremity weakness, paresthesias, involuntary movement, seizure and syncope.      Physical Exam Updated Vital Signs BP 137/84   Pulse 96   Temp 97.8 F (36.6 C)   Resp (!) 24   Ht 5\' 4"  (1.626 m)   Wt 59.4 kg (131 lb)   SpO2 91%   BMI 22.49 kg/m   Physical Exam 2015: Physical examination:  Nursing notes reviewed; Vital signs and O2 SAT reviewed;  Constitutional: Well developed,  Well nourished, Well hydrated, Uncomfortable appearing.;; Head:  Normocephalic, atraumatic; Eyes: EOMI, PERRL, No scleral icterus; ENMT: Mouth and pharynx normal, Mucous membranes moist; Neck: Supple, Full range of motion, No lymphadenopathy; Cardiovascular: Tachycardic rate and rhythm, No gallop; Respiratory: Breath sounds diminished & equal bilaterally, insp/exp wheezes bilat. Faint audible wheezing. Speaking short sentences, sitting upright, tachypneic.; Chest: Nontender, Movement normal; Abdomen: Soft, Nontender, Nondistended, Normal bowel sounds; Genitourinary: No CVA tenderness; Extremities: Pulses normal, No tenderness, No edema, No calf edema or asymmetry.; Neuro: AA&Ox3, Major CN grossly intact.  Speech clear. No gross focal motor or sensory deficits in extremities.; Skin: Color normal, Warm, Dry.   ED Treatments / Results  Labs (all labs ordered are listed, but only abnormal results are displayed)   EKG  EKG Interpretation None       Radiology   Procedures Procedures (including critical care time)  Medications Ordered in ED Medications  methylPREDNISolone sodium succinate (SOLU-MEDROL) 125 mg/2 mL injection 125 mg (125 mg Intravenous Given 07/20/17 2032)  albuterol  (PROVENTIL,VENTOLIN) solution continuous neb (10 mg/hr Nebulization Given 07/20/17 2020)  ipratropium (ATROVENT) nebulizer solution 1 mg (1 mg Nebulization Given 07/20/17 2020)     Initial Impression / Assessment and Plan / ED Course  I have reviewed the triage vital signs and the nursing notes.  Pertinent labs & imaging results that were available during my care of the patient were reviewed by me and considered in my medical decision making (see chart for details).  MDM Reviewed: previous chart, nursing note and vitals Reviewed previous: labs and ECG Interpretation: labs, ECG and x-ray Total time providing critical care: 30-74 minutes. This excludes time spent performing separately reportable procedures and services. Consults: admitting MD   CRITICAL CARE Performed by: Alfonzo Feller Total critical care time: 35 minutes Critical care time was exclusive of separately billable procedures and treating other patients. Critical care was necessary to treat or prevent imminent or life-threatening deterioration. Critical care was time spent personally by me on the following activities: development of treatment plan with patient and/or surrogate as well as nursing, discussions with consultants, evaluation of patient's response to treatment, examination of patient, obtaining history from patient or surrogate, ordering and performing treatments and interventions, ordering and review of laboratory studies, ordering and review of radiographic studies, pulse oximetry and re-evaluation of patient's condition.   Results for orders placed or performed during the hospital encounter of 70/96/28  Basic metabolic panel  Result Value Ref Range   Sodium 140 135 - 145 mmol/L   Potassium 4.4 3.5 - 5.1 mmol/L   Chloride 99 (L) 101 - 111 mmol/L   CO2 32 22 - 32 mmol/L   Glucose, Bld 101 (H) 65 - 99 mg/dL   BUN 14 6 - 20 mg/dL   Creatinine, Ser 0.79 0.44 - 1.00 mg/dL   Calcium 8.5 (L) 8.9 - 10.3 mg/dL    GFR calc non Af Amer >60 >60 mL/min   GFR calc Af Amer >60 >60 mL/min   Anion gap 9 5 - 15  Troponin I  Result Value Ref Range   Troponin I <0.03 <0.03 ng/mL  CBC with Differential  Result Value Ref Range   WBC 10.7 (H) 4.0 - 10.5 K/uL   RBC 4.75 3.87 - 5.11 MIL/uL   Hemoglobin 15.9 (H) 12.0 - 15.0 g/dL   HCT 49.1 (H) 36.0 - 46.0 %   MCV 103.4 (H) 78.0 - 100.0 fL   MCH 33.5 26.0 - 34.0 pg   MCHC 32.4 30.0 -  36.0 g/dL   RDW 13.2 11.5 - 15.5 %   Platelets 260 150 - 400 K/uL   Neutrophils Relative % 70 %   Lymphocytes Relative 20 %   Monocytes Relative 10 %   Eosinophils Relative 0 %   Basophils Relative 0 %   Neutro Abs 7.5 1.7 - 7.7 K/uL   Lymphs Abs 2.1 0.7 - 4.0 K/uL   Monocytes Absolute 1.1 (H) 0.1 - 1.0 K/uL   Eosinophils Absolute 0.0 0.0 - 0.7 K/uL   Basophils Absolute 0.0 0.0 - 0.1 K/uL   WBC Morphology ATYPICAL LYMPHOCYTES    Dg Chest 2 View Result Date: 07/20/2017 CLINICAL DATA:  Productive cough that has been unresponsive to antibiotic therapy and a 1 week course of corticosteroid therapy. EXAM: CHEST  2 VIEW COMPARISON:  07/29/2015, 10/03/2013. FINDINGS: Cardiomediastinal silhouette unremarkable, unchanged. Hyperinflation with emphysematous changes throughout both lungs, predominantly the upper lobes, unchanged. Prominent bronchovascular markings diffusely and moderate central peribronchial thickening, unchanged. Lungs otherwise clear. No localized airspace consolidation. No pleural effusions. No pneumothorax. Normal pulmonary vascularity. Degenerative changes involving the thoracic spine. IMPRESSION: 1.  No acute cardiopulmonary disease. 2.  Emphysema (ICD10-J43.9). Electronically Signed   By: Evangeline Dakin M.D.   On: 07/20/2017 19:29     2220:  On arrival: pt sitting upright, tachypneic, tachycardic, Sats 91 % R/A, lungs diminished. IV solumedrol and hour long neb started. After neb: pt appears more comfortable at rest, less tachypneic, Sats 94 % on O2 2L N/C, lungs  continue diminished. Pt ambulated several feet in her exam room only when pt's O2 Sats dropped to mid-80's % R/A with pt c/o increasing SOB, with increasing HR and RR. Pt escorted back to stretcher with O2 Sats slowly increasing to 94 % on O2 2L N/C.  T/C to [facility]. Olevia Bowens, case discussed, including:  HPI, pertinent PM/SHx, VS/PE, dx testing, ED course and treatment:  Agreeable to admit.      Final Clinical Impressions(s) / ED Diagnoses   Final diagnoses:  None    ED Discharge Orders    None       Francine Graven, DO 07/21/17 1747

## 2017-07-21 ENCOUNTER — Other Ambulatory Visit: Payer: Self-pay

## 2017-07-21 ENCOUNTER — Encounter (HOSPITAL_COMMUNITY): Payer: Self-pay | Admitting: *Deleted

## 2017-07-21 DIAGNOSIS — Z881 Allergy status to other antibiotic agents status: Secondary | ICD-10-CM | POA: Diagnosis not present

## 2017-07-21 DIAGNOSIS — K5900 Constipation, unspecified: Secondary | ICD-10-CM | POA: Diagnosis present

## 2017-07-21 DIAGNOSIS — E039 Hypothyroidism, unspecified: Secondary | ICD-10-CM | POA: Diagnosis present

## 2017-07-21 DIAGNOSIS — J9601 Acute respiratory failure with hypoxia: Secondary | ICD-10-CM | POA: Diagnosis present

## 2017-07-21 DIAGNOSIS — Z825 Family history of asthma and other chronic lower respiratory diseases: Secondary | ICD-10-CM | POA: Diagnosis not present

## 2017-07-21 DIAGNOSIS — Z885 Allergy status to narcotic agent status: Secondary | ICD-10-CM | POA: Diagnosis not present

## 2017-07-21 DIAGNOSIS — Z23 Encounter for immunization: Secondary | ICD-10-CM | POA: Diagnosis not present

## 2017-07-21 DIAGNOSIS — R0602 Shortness of breath: Secondary | ICD-10-CM | POA: Diagnosis not present

## 2017-07-21 DIAGNOSIS — Z79899 Other long term (current) drug therapy: Secondary | ICD-10-CM | POA: Diagnosis not present

## 2017-07-21 DIAGNOSIS — D751 Secondary polycythemia: Secondary | ICD-10-CM | POA: Diagnosis present

## 2017-07-21 DIAGNOSIS — B370 Candidal stomatitis: Secondary | ICD-10-CM | POA: Diagnosis present

## 2017-07-21 DIAGNOSIS — F17210 Nicotine dependence, cigarettes, uncomplicated: Secondary | ICD-10-CM | POA: Diagnosis present

## 2017-07-21 DIAGNOSIS — Z7951 Long term (current) use of inhaled steroids: Secondary | ICD-10-CM | POA: Diagnosis not present

## 2017-07-21 DIAGNOSIS — Z79891 Long term (current) use of opiate analgesic: Secondary | ICD-10-CM | POA: Diagnosis not present

## 2017-07-21 DIAGNOSIS — Z79890 Hormone replacement therapy (postmenopausal): Secondary | ICD-10-CM | POA: Diagnosis not present

## 2017-07-21 DIAGNOSIS — J441 Chronic obstructive pulmonary disease with (acute) exacerbation: Secondary | ICD-10-CM | POA: Diagnosis not present

## 2017-07-21 LAB — BASIC METABOLIC PANEL
Anion gap: 8 (ref 5–15)
BUN: 20 mg/dL (ref 6–20)
CHLORIDE: 102 mmol/L (ref 101–111)
CO2: 30 mmol/L (ref 22–32)
Calcium: 7.5 mg/dL — ABNORMAL LOW (ref 8.9–10.3)
Creatinine, Ser: 0.63 mg/dL (ref 0.44–1.00)
GFR calc non Af Amer: >60 mL/min (ref >60)
Glucose, Bld: 180 mg/dL — ABNORMAL HIGH (ref 65–99)
POTASSIUM: 4.8 mmol/L (ref 3.5–5.1)
SODIUM: 140 mmol/L (ref 135–145)

## 2017-07-21 LAB — CBC WITH DIFFERENTIAL/PLATELET
Basophils Absolute: 0 10*3/uL (ref 0.0–0.1)
Basophils Relative: 0 %
EOS ABS: 0 10*3/uL (ref 0.0–0.7)
Eosinophils Relative: 0 %
HEMATOCRIT: 44.9 % (ref 36.0–46.0)
HEMOGLOBIN: 14.2 g/dL (ref 12.0–15.0)
LYMPHS ABS: 0.6 10*3/uL — AB (ref 0.7–4.0)
Lymphocytes Relative: 7 %
MCH: 33.1 pg (ref 26.0–34.0)
MCHC: 31.6 g/dL (ref 30.0–36.0)
MCV: 104.7 fL — ABNORMAL HIGH (ref 78.0–100.0)
MONOS PCT: 1 %
Monocytes Absolute: 0.1 10*3/uL (ref 0.1–1.0)
NEUTROS ABS: 7.1 10*3/uL (ref 1.7–7.7)
NEUTROS PCT: 92 %
Platelets: 240 10*3/uL (ref 150–400)
RBC: 4.29 MIL/uL (ref 3.87–5.11)
RDW: 13.6 % (ref 11.5–15.5)
WBC: 7.7 10*3/uL (ref 4.0–10.5)

## 2017-07-21 MED ORDER — MAGNESIUM SULFATE 2 GM/50ML IV SOLN
2.0000 g | Freq: Once | INTRAVENOUS | Status: AC
  Administered 2017-07-21: 2 g via INTRAVENOUS
  Filled 2017-07-21: qty 50

## 2017-07-21 MED ORDER — INFLUENZA VAC SPLIT QUAD 0.5 ML IM SUSY
0.5000 mL | PREFILLED_SYRINGE | INTRAMUSCULAR | Status: AC
  Administered 2017-07-22: 0.5 mL via INTRAMUSCULAR
  Filled 2017-07-21: qty 0.5

## 2017-07-21 MED ORDER — METHYLPREDNISOLONE SODIUM SUCC 125 MG IJ SOLR
80.0000 mg | Freq: Four times a day (QID) | INTRAMUSCULAR | Status: DC
  Administered 2017-07-21 – 2017-07-23 (×7): 80 mg via INTRAVENOUS
  Filled 2017-07-21 (×7): qty 2

## 2017-07-21 MED ORDER — AZITHROMYCIN 250 MG PO TABS
500.0000 mg | ORAL_TABLET | Freq: Every day | ORAL | Status: DC
  Administered 2017-07-21 – 2017-07-23 (×3): 500 mg via ORAL
  Filled 2017-07-21 (×3): qty 2

## 2017-07-21 MED ORDER — NYSTATIN 100000 UNIT/ML MT SUSP
5.0000 mL | Freq: Four times a day (QID) | OROMUCOSAL | Status: DC
  Administered 2017-07-21 – 2017-07-24 (×13): 500000 [IU] via ORAL
  Filled 2017-07-21 (×12): qty 5

## 2017-07-21 MED ORDER — IPRATROPIUM-ALBUTEROL 0.5-2.5 (3) MG/3ML IN SOLN
3.0000 mL | Freq: Four times a day (QID) | RESPIRATORY_TRACT | Status: DC
  Administered 2017-07-21 – 2017-07-22 (×6): 3 mL via RESPIRATORY_TRACT
  Filled 2017-07-21 (×6): qty 3

## 2017-07-21 MED ORDER — ORAL CARE MOUTH RINSE
15.0000 mL | Freq: Two times a day (BID) | OROMUCOSAL | Status: DC
  Administered 2017-07-21 – 2017-07-23 (×6): 15 mL via OROMUCOSAL

## 2017-07-21 MED ORDER — PNEUMOCOCCAL VAC POLYVALENT 25 MCG/0.5ML IJ INJ
0.5000 mL | INJECTION | INTRAMUSCULAR | Status: AC
  Administered 2017-07-22: 0.5 mL via INTRAMUSCULAR
  Filled 2017-07-21: qty 0.5

## 2017-07-21 NOTE — Progress Notes (Signed)
Initial Nutrition Assessment  DOCUMENTATION CODES:      INTERVENTION:  Agree with Heart Healthy diet though the patient has no intention of following this at home.  Add MVI daily  NUTRITION DIAGNOSIS:   Predicted suboptimal nutrient intake related to decreased appetite as evidenced by per patient/family report.   GOAL:  Patient will meet greater than or equal to 90% of their needs   MONITOR:   PO intake, Supplement acceptance, Weight trends, Labs  REASON FOR ASSESSMENT:   Consult Assessment of nutrition requirement/status  ASSESSMENT:  51 yo female with COPD exacerbation. hx of constipation and she smokes 1 pack per day cigarettes. Short of breath on admission and coughing. EKG obtained and chest x-ray findings- "Hyperinflation with emphysematous changes throughout both lungs". Her appetite is good 75% of meals and she is eating a large piece of honey bun cake during RD visit. The patient says she only eats when hungry. Sometimes at home she may go all day and not eat. Encouraged her to adopt a more healthy eating pattern with consistent protein, vegetables, fruits and whole grains. Expect based on her diet hx inadequate quantity of essential nutrients consumed.  Her weight hx is fairly stable she reportedly fluctuates between 127-135 lbs. She is currently 100% of usual weight.  Recent Labs  Lab 07/20/17 2033 07/21/17 0439  NA 140 140  K 4.4 4.8  CL 99* 102  CO2 32 30  BUN 14 20  CREATININE 0.79 0.63  CALCIUM 8.5* 7.5*  GLUCOSE 101* 180*   Labs and meds reviewed.    NUTRITION - FOCUSED PHYSICAL EXAM:    Most Recent Value  Orbital Region  No depletion  Upper Arm Region  No depletion  Buccal Region  No depletion  Temple Region  No depletion  Clavicle Bone Region  Mild depletion  Clavicle and Acromion Bone Region  No depletion  Dorsal Hand  No depletion  Edema (RD Assessment)  None  Hair  Reviewed  Eyes  Reviewed  Skin  Reviewed        Diet Order:  Diet  Heart Room service appropriate? Yes; Fluid consistency: Thin  EDUCATION NEEDS:  Encouraged her consider a more healthy eating pattern Education needs have been addressed  Skin:  Skin Assessment: Reviewed RN Assessment  Last BM:  11/5 toliets independently  Height:   Ht Readings from Last 1 Encounters:  07/21/17 5\' 4"  (1.626 m)    Weight:   Wt Readings from Last 1 Encounters:  07/21/17 132 lb 7.9 oz (60.1 kg)    Ideal Body Weight:   55 kg  BMI:  Body mass index is 22.74 kg/m.  Estimated Nutritional Needs:   Kcal:  1680-1800  (28-30 kcal/kg)  Protein:  72-78 (1.2-1.3 gr/kg)  Fluid:  1800 ml daily   Colman Cater MS,RD,CSG,LDN Office: [phone] Pager: [phone]

## 2017-07-21 NOTE — Progress Notes (Signed)
PROGRESS NOTE    Gloria Calderon  IOX:[id number] DOB: October 12, 1965 DOA: 07/20/2017 PCP: Octavio Graves, DO     Brief Narrative:  51 year old woman admitted from home on 11/5 due to shortness of breath and productive cough.  She has known history of COPD and continues to smoke a pack a day.  She is admitted for management of acute COPD exacerbation.   Assessment & Plan:   Principal Problem:   COPD with acute exacerbation ([place]) Active Problems:   Thrush   Tobacco abuse   Polycythemia   COPD with acute exacerbation/acute hypoxemic respiratory failure -Still with significant wheezing on exam today. -Start IV Solu-Medrol, continue nebs. -Given productive cough will start Azithromycin, although no sign of pneumonia on chest x-ray. -Oxygen as needed, will need formal oxygen assessment prior to discharge. -Strongly counseled on smoking cessation.  Tobacco abuse -Nicotine patch, counseled on cessation.   DVT prophylaxis: Lovenox  Code Status: Full code Family Communication: Mother at bedside updated on plan of care and all questions answered Disposition Plan: Home when medically stable, anticipate 48-72 hours   Consultants:   None  Procedures:   None  Antimicrobials:  Anti-infectives (From admission, onward)   Start     Dose/Rate Route Frequency Ordered Stop   07/21/17 1230  azithromycin (ZITHROMAX) tablet 500 mg     500 mg Oral Daily 07/21/17 1203         Subjective: Still with significant shortness of breath, productive cough.  Objective: Vitals:   07/21/17 0657 07/21/17 0810 07/21/17 1349 07/21/17 1400  BP: 111/62   (!) 142/82  Pulse: 82   90  Resp: 18   (!) 22  Temp: 97.8 F (36.6 C)   97.9 F (36.6 C)  TempSrc: Oral   Oral  SpO2: 99% 91% 97% 99%  Weight:      Height:        Intake/Output Summary (Last 24 hours) at 07/21/2017 1651 Last data filed at 07/21/2017 1451 Gross per 24 hour  Intake 2006.67 ml  Output -  Net 2006.67 ml   Filed Weights     07/20/17 1845 07/21/17 0037  Weight: 59.4 kg (131 lb) 60.1 kg (132 lb 7.9 oz)    Examination:  General exam: Alert, awake, oriented x 3 Respiratory system: Significant bilateral expiratory wheezes and coarse breath Cardiovascular system:RRR. No murmurs, rubs, gallops. Gastrointestinal system: Abdomen is nondistended, soft and nontender. No organomegaly or masses felt. Normal bowel sounds heard. Central nervous system: Alert and oriented. No focal neurological deficits. Extremities: No C/C/E, +pedal pulses Skin: No rashes, lesions or ulcers Psychiatry: Judgement and insight appear normal. Mood & affect appropriate.     Data Reviewed: I have personally reviewed following labs and imaging studies  CBC: Recent Labs  Lab 07/20/17 2033 07/21/17 0439  WBC 10.7* 7.7  NEUTROABS 7.5 7.1  HGB 15.9* 14.2  HCT 49.1* 44.9  MCV 103.4* 104.7*  PLT 260 268   Basic Metabolic Panel: Recent Labs  Lab 07/20/17 2033 07/21/17 0439  NA 140 140  K 4.4 4.8  CL 99* 102  CO2 32 30  GLUCOSE 101* 180*  BUN 14 20  CREATININE 0.79 0.63  CALCIUM 8.5* 7.5*   GFR: Estimated Creatinine Clearance: 71.8 mL/min (by C-G formula based on SCr of 0.63 mg/dL). Liver Function Tests: No results for input(s): AST, ALT, ALKPHOS, BILITOT, PROT, ALBUMIN in the last 168 hours. No results for input(s): LIPASE, AMYLASE in the last 168 hours. No results for input(s): AMMONIA in the last  168 hours. Coagulation Profile: No results for input(s): INR, PROTIME in the last 168 hours. Cardiac Enzymes: Recent Labs  Lab 07/20/17 2033  TROPONINI <0.03   BNP (last 3 results) No results for input(s): PROBNP in the last 8760 hours. HbA1C: No results for input(s): HGBA1C in the last 72 hours. CBG: No results for input(s): GLUCAP in the last 168 hours. Lipid Profile: No results for input(s): CHOL, HDL, LDLCALC, TRIG, CHOLHDL, LDLDIRECT in the last 72 hours. Thyroid Function Tests: No results for input(s): TSH,  T4TOTAL, FREET4, T3FREE, THYROIDAB in the last 72 hours. Anemia Panel: No results for input(s): VITAMINB12, FOLATE, FERRITIN, TIBC, IRON, RETICCTPCT in the last 72 hours. Urine analysis: No results found for: COLORURINE, APPEARANCEUR, LABSPEC, PHURINE, GLUCOSEU, HGBUR, BILIRUBINUR, KETONESUR, PROTEINUR, UROBILINOGEN, NITRITE, LEUKOCYTESUR Sepsis Labs: @LABRCNTIP (procalcitonin:4,lacticidven:4)  )No results found for this or any previous visit (from the past 240 hour(s)).       Radiology Studies: Dg Chest 2 View  Result Date: 07/20/2017 CLINICAL DATA:  Productive cough that has been unresponsive to antibiotic therapy and a 1 week course of corticosteroid therapy. EXAM: CHEST  2 VIEW COMPARISON:  07/29/2015, 10/03/2013. FINDINGS: Cardiomediastinal silhouette unremarkable, unchanged. Hyperinflation with emphysematous changes throughout both lungs, predominantly the upper lobes, unchanged. Prominent bronchovascular markings diffusely and moderate central peribronchial thickening, unchanged. Lungs otherwise clear. No localized airspace consolidation. No pleural effusions. No pneumothorax. Normal pulmonary vascularity. Degenerative changes involving the thoracic spine. IMPRESSION: 1.  No acute cardiopulmonary disease. 2.  Emphysema (ICD10-J43.9). Electronically Signed   By: Evangeline Dakin M.D.   On: 07/20/2017 19:29        Scheduled Meds: . azithromycin  500 mg Oral Daily  . cyclobenzaprine  10 mg Oral QHS  . docusate sodium  100 mg Oral BID  . enoxaparin (LOVENOX) injection  40 mg Subcutaneous Q24H  . [START ON 07/22/2017] Influenza vac split quadrivalent PF  0.5 mL Intramuscular Tomorrow-1000  . ipratropium-albuterol  3 mL Nebulization Q6H  . levothyroxine  25 mcg Oral QAC breakfast  . mouth rinse  15 mL Mouth Rinse BID  . methylPREDNISolone (SOLU-MEDROL) injection  80 mg Intravenous Q6H  . nystatin  5 mL Oral QID  . pantoprazole  40 mg Oral Daily  . [START ON 07/22/2017] pneumococcal  23 valent vaccine  0.5 mL Intramuscular Tomorrow-1000   Continuous Infusions: . sodium chloride 100 mL/hr at 07/21/17 1251     LOS: 0 days    Time spent: 35 minutes. Greater than 50% of this time was spent in direct contact with the patient coordinating care.     Lelon Frohlich, MD [facility] Pager [phone]  If 7PM-7AM, please contact night-coverage [email or web address] Password [email or web address] 07/21/2017, 4:51 PM

## 2017-07-22 DIAGNOSIS — J9601 Acute respiratory failure with hypoxia: Secondary | ICD-10-CM

## 2017-07-22 LAB — EXPECTORATED SPUTUM ASSESSMENT W REFEX TO RESP CULTURE

## 2017-07-22 LAB — HIV ANTIBODY (ROUTINE TESTING W REFLEX): HIV SCREEN 4TH GENERATION: NONREACTIVE

## 2017-07-22 LAB — EXPECTORATED SPUTUM ASSESSMENT W GRAM STAIN, RFLX TO RESP C

## 2017-07-22 MED ORDER — GUAIFENESIN-DM 100-10 MG/5ML PO SYRP: 5.0000 mL | ORAL_SOLUTION | ORAL | Status: DC | PRN

## 2017-07-22 MED ORDER — IPRATROPIUM-ALBUTEROL 0.5-2.5 (3) MG/3ML IN SOLN
3.0000 mL | RESPIRATORY_TRACT | Status: DC
  Administered 2017-07-22 – 2017-07-24 (×13): 3 mL via RESPIRATORY_TRACT
  Filled 2017-07-22 (×13): qty 3

## 2017-07-22 MED ORDER — GUAIFENESIN ER 600 MG PO TB12
600.0000 mg | ORAL_TABLET | Freq: Two times a day (BID) | ORAL | Status: DC
  Administered 2017-07-22 – 2017-07-24 (×5): 600 mg via ORAL
  Filled 2017-07-22 (×5): qty 1

## 2017-07-22 MED ORDER — SENNOSIDES-DOCUSATE SODIUM 8.6-50 MG PO TABS
1.0000 | ORAL_TABLET | Freq: Two times a day (BID) | ORAL | Status: DC
  Administered 2017-07-22 – 2017-07-24 (×3): 1 via ORAL
  Filled 2017-07-22 (×3): qty 1

## 2017-07-22 NOTE — Progress Notes (Signed)
PROGRESS NOTE                                                                                                                                                                                                             Patient Demographics:    Gloria Calderon, is a 51 y.o. female, DOB - 01-21-1966, DPO:[id number]  Admit date - 07/20/2017   Admitting Physician Reubin Milan, MD  Outpatient Primary MD for the patient is Octavio Graves, DO  LOS - 1  Outpatient Specialists:None  Chief Complaint  Patient presents with  . Shortness of Breath       Brief Narrative  51 year old female with COPD and ongoing heavy tobacco use presented with acute COPD exacerbation and hypoxic respiratory failure.   Subjective:   Breathing not much changed from yesterday. Still having difficulty to speak in full sentences. Reports Coughing yellowish phlegm.   Assessment  & Plan :    Principal Problem:   COPD with acute exacerbation ([place]) Acute respiratory failure with hypoxia Re: 2 L O2 via nasal cannula. Continue IV Solu Medrol 80 mg every 6 hours. Will increase frequency of DuoNeb's to every 4 hours And when necessary albuterol nebs Every 2 hours. Continue azithromycin. Add Mucinex. Counseled strongly on smoking cessation. Lives with her mother and mother's boyfriend both of who smoke. Continue nicotine patch. Assess need for home O2 prior to discharge.  Active problems Hypothyroidism Continue Synthroid  Oral thrush Continue nystatin.      Code Status : Full code  Family Communication  : None at bedside  Disposition Plan  : Home possibly in 48 hours if improved  Barriers For Discharge : Active symptoms  Consults  :  None  Procedures  : None  DVT Prophylaxis  :  Lovenox -   Lab Results  Component Value Date   PLT 240 07/21/2017    Antibiotics  :    Anti-infectives (From admission, onward)   Start      Dose/Rate Route Frequency Ordered Stop   07/21/17 1230  azithromycin (ZITHROMAX) tablet 500 mg     500 mg Oral Daily 07/21/17 1203          Objective:   Vitals:   07/21/17 2049 07/22/17 0147 07/22/17 0648 07/22/17 0815  BP: (!) 152/80  127/82   Pulse: 93  90  Resp: 20  15   Temp: 98.3 F (36.8 C)  97.7 F (36.5 C)   TempSrc: Oral  Oral   SpO2: 100% 99% 99% 99%  Weight:      Height:        Wt Readings from Last 3 Encounters:  07/21/17 60.1 kg (132 lb 7.9 oz)  04/02/17 57.7 kg (127 lb 1.6 oz)  12/28/16 58.1 kg (128 lb)     Intake/Output Summary (Last 24 hours) at 07/22/2017 1112 Last data filed at 07/22/2017 0900 Gross per 24 hour  Intake 1620 ml  Output -  Net 1620 ml     Physical Exam  Gen: Unable to speak in full sentences HEENT: no pallor, moist mucosa, supple neck, No thrush Chest: Diffuse wheezing bilaterally with diminished breath sounds CVS: N S1&S2, no murmurs, rubs or gallop GI: soft, NT, ND, BS+ Musculoskeletal: warm, no edema     Data Review:    CBC Recent Labs  Lab 07/20/17 2033 07/21/17 0439  WBC 10.7* 7.7  HGB 15.9* 14.2  HCT 49.1* 44.9  PLT 260 240  MCV 103.4* 104.7*  MCH 33.5 33.1  MCHC 32.4 31.6  RDW 13.2 13.6  LYMPHSABS 2.1 0.6*  MONOABS 1.1* 0.1  EOSABS 0.0 0.0  BASOSABS 0.0 0.0    Chemistries  Recent Labs  Lab 07/20/17 2033 07/21/17 0439  NA 140 140  K 4.4 4.8  CL 99* 102  CO2 32 30  GLUCOSE 101* 180*  BUN 14 20  CREATININE 0.79 0.63  CALCIUM 8.5* 7.5*   ------------------------------------------------------------------------------------------------------------------ No results for input(s): CHOL, HDL, LDLCALC, TRIG, CHOLHDL, LDLDIRECT in the last 72 hours.  No results found for: HGBA1C ------------------------------------------------------------------------------------------------------------------ No results for input(s): TSH, T4TOTAL, T3FREE, THYROIDAB in the last 72 hours.  Invalid input(s):  FREET3 ------------------------------------------------------------------------------------------------------------------ No results for input(s): VITAMINB12, FOLATE, FERRITIN, TIBC, IRON, RETICCTPCT in the last 72 hours.  Coagulation profile No results for input(s): INR, PROTIME in the last 168 hours.  No results for input(s): DDIMER in the last 72 hours.  Cardiac Enzymes Recent Labs  Lab 07/20/17 2033  TROPONINI <0.03   ------------------------------------------------------------------------------------------------------------------ No results found for: BNP  Inpatient Medications  Scheduled Meds: . azithromycin  500 mg Oral Daily  . cyclobenzaprine  10 mg Oral QHS  . docusate sodium  100 mg Oral BID  . enoxaparin (LOVENOX) injection  40 mg Subcutaneous Q24H  . ipratropium-albuterol  3 mL Nebulization Q4H  . levothyroxine  25 mcg Oral QAC breakfast  . mouth rinse  15 mL Mouth Rinse BID  . methylPREDNISolone (SOLU-MEDROL) injection  80 mg Intravenous Q6H  . nystatin  5 mL Oral QID  . pantoprazole  40 mg Oral Daily   Continuous Infusions: PRN Meds:.albuterol, ALPRAZolam, nicotine, ondansetron **OR** ondansetron (ZOFRAN) IV, oxyCODONE  Micro Results No results found for this or any previous visit (from the past 240 hour(s)).  Radiology Reports Dg Chest 2 View  Result Date: 07/20/2017 CLINICAL DATA:  Productive cough that has been unresponsive to antibiotic therapy and a 1 week course of corticosteroid therapy. EXAM: CHEST  2 VIEW COMPARISON:  07/29/2015, 10/03/2013. FINDINGS: Cardiomediastinal silhouette unremarkable, unchanged. Hyperinflation with emphysematous changes throughout both lungs, predominantly the upper lobes, unchanged. Prominent bronchovascular markings diffusely and moderate central peribronchial thickening, unchanged. Lungs otherwise clear. No localized airspace consolidation. No pleural effusions. No pneumothorax. Normal pulmonary vascularity. Degenerative  changes involving the thoracic spine. IMPRESSION: 1.  No acute cardiopulmonary disease. 2.  Emphysema (ICD10-J43.9). Electronically Signed   By: Evangeline Dakin  M.D.   On: 07/20/2017 19:29    Time Spent in minutes  25   Louellen Molder M.D on 07/22/2017 at 11:12 AM  Between 7am to 7pm - Pager - [phone]  After 7pm go to [email or web address] - password [facility]  [facility] -  Office  [phone]

## 2017-07-22 NOTE — Care Management Note (Signed)
Case Management Note  Patient Details  Name: JULIETH TUGMAN MRN: [id number] Date of Birth: 03/20/1966  Subjective/Objective:             Admitted with COPD exacerbation. Pt is from home. Ind with ADL's. She has PCP but no insurance. She says her medicare will kick in next year and her income disqualifies her for Medicaid. She is not on oxygen pta. She does not have neb machine pta.        Action/Plan: CM will cont to follow. ? Need oxygen, ? Need neb machine at DC.   Expected Discharge Date:  07/23/17               Expected Discharge Plan:  Home/Self Care  In-House Referral:     Discharge planning Services     Post Acute Care Choice:    Choice offered to:     DME Arranged:    DME Agency:     HH Arranged:    HH Agency:     Status of Service:  In process, will continue to follow  If discussed at Long Length of Stay Meetings, dates discussed:    Additional Comments:  Sherald Barge, RN 07/22/2017, 2:00 PM

## 2017-07-23 DIAGNOSIS — B370 Candidal stomatitis: Secondary | ICD-10-CM

## 2017-07-23 MED ORDER — AMOXICILLIN-POT CLAVULANATE 875-125 MG PO TABS
1.0000 | ORAL_TABLET | Freq: Two times a day (BID) | ORAL | Status: DC
  Administered 2017-07-23 – 2017-07-24 (×3): 1 via ORAL
  Filled 2017-07-23 (×3): qty 1

## 2017-07-23 MED ORDER — POLYETHYLENE GLYCOL 3350 17 G PO PACK
17.0000 g | PACK | Freq: Every day | ORAL | Status: DC
  Administered 2017-07-23 – 2017-07-24 (×2): 17 g via ORAL
  Filled 2017-07-23 (×2): qty 1

## 2017-07-23 MED ORDER — METHYLPREDNISOLONE SODIUM SUCC 125 MG IJ SOLR
60.0000 mg | Freq: Four times a day (QID) | INTRAMUSCULAR | Status: DC
  Administered 2017-07-23 – 2017-07-24 (×4): 60 mg via INTRAVENOUS
  Filled 2017-07-23 (×4): qty 2

## 2017-07-23 MED ORDER — BISACODYL 10 MG RE SUPP
10.0000 mg | Freq: Every day | RECTAL | Status: DC | PRN
  Administered 2017-07-23: 10 mg via RECTAL
  Filled 2017-07-23: qty 1

## 2017-07-23 NOTE — Plan of Care (Signed)
  Acute Rehab PT Goals(only PT should resolve) Patient Will Transfer Sit To/From Stand 07/23/2017 1453 - Progressing by Lonell Grandchild, PT Flowsheets Taken 07/23/2017 1453  Patient will transfer sit to/from stand Independently Pt Will Transfer Bed To Chair/Chair To Bed 07/23/2017 1453 - Progressing by Lonell Grandchild, PT Flowsheets Taken 07/23/2017 1453  Pt will Transfer Bed to Chair/Chair to Bed Independently Pt Will Ambulate 07/23/2017 1453 - Progressing by Lonell Grandchild, PT Flowsheets Taken 07/23/2017 1453  Pt will Ambulate 100 feet;Independently Pt Will Go Up/Down Stairs 07/23/2017 1453 - Progressing by Lonell Grandchild, PT Flowsheets Taken 07/23/2017 1453  Pt will Go Up / Down Stairs 3-5 stairs;with minimal assist;with rail(s)  2:54 PM, 07/23/17 Lonell Grandchild, MPT Physical Therapist with [facility] [phone] office [phone] mobile phone

## 2017-07-23 NOTE — Progress Notes (Signed)
Pt ambulated 50 feet.  02 sat 94% on room air.

## 2017-07-23 NOTE — Care Management (Signed)
Clarification. Pt does have neb machine at home pta.

## 2017-07-23 NOTE — Evaluation (Signed)
Physical Therapy Evaluation Patient Details Name: Gloria Calderon MRN: [id number] DOB: Nov 09, 1965 Today's Date: 07/23/2017   History of Present Illness  Gloria Calderon is a 51 y.o. female with medical history significant of constipation, spinal stenosis, tobacco use, emphysema who is coming to the emergency department with progressively worsening dyspnea associated with productive yellowish sputum despite taking an antibiotic and prednisone called in by her PCP about a week ago.  No known sick contacts or travel history.  She denies fever, chills, night sweats, palpitations, chest pain, dizziness, abdominal pain, nausea, emesis, diarrhea, melena or hematochezia.  The patient states that she has frequent constipation.  No dysuria, frequency or hematuria.    Clinical Impression  Patient functioning near baseline for functional mobility/gait, limited mostly due to SOB and mild O2 desaturation from 94% to 90% after walking approximately 35 feet while on room air and has tendency to lean on nearby objects for support once fatigued.  Patient will benefit from continued physical therapy in hospital and recommended venue below to increase strength, balance, endurance for safe ADLs and gait.    Follow Up Recommendations Home health PT;Supervision - Intermittent    Equipment Recommendations       Recommendations for Other Services       Precautions / Restrictions Precautions Precautions: Fall Restrictions Weight Bearing Restrictions: No      Mobility  Bed Mobility Overal bed mobility: Modified Independent                Transfers Overall transfer level: Needs assistance Equipment used: None Transfers: Sit to/from Stand;Stand Pivot Transfers Sit to Stand: Supervision Stand pivot transfers: Supervision       General transfer comment: slow labored movement  Ambulation/Gait Ambulation/Gait assistance: Min guard Ambulation Distance (Feet): 35 Feet Assistive device: None;1  person hand held assist Gait Pattern/deviations: Decreased step length - right;Decreased step length - left;Decreased stride length Gait velocity: slow Gait velocity interpretation: Below normal speed for age/gender General Gait Details: demonstrates slow labored cadence with frequent leaning on near by objects for support, limited secondary to c/o SOB, on room air with O2 Sats between 90-94%  Stairs            Wheelchair Mobility    Modified Rankin (Stroke Patients Only)       Balance Overall balance assessment: Needs assistance Sitting-balance support: No upper extremity supported;Feet supported Sitting balance-Leahy Scale: Good     Standing balance support: No upper extremity supported;During functional activity Standing balance-Leahy Scale: Fair                               Pertinent Vitals/Pain Pain Assessment: 0-10 Pain Score: 6  Pain Location: chronc low back pain Pain Descriptors / Indicators: Aching;Discomfort Pain Intervention(s): Limited activity within patient's tolerance;Monitored during session    Home Living Family/patient expects to be discharged to:: Private residence Living Arrangements: Parent(lives with mother and mother's boy friend) Available Help at Discharge: Family(states mother's boy friend can help) Type of Home: Mobile home Home Access: Stairs to enter Entrance Stairs-Rails: Right;Left;Can reach both Entrance Stairs-Number of Steps: 3 Home Layout: One level Home Equipment: Cane - single point      Prior Function Level of Independence: Independent               Hand Dominance        Extremity/Trunk Assessment   Upper Extremity Assessment Upper Extremity Assessment: Generalized weakness    Lower Extremity  Assessment Lower Extremity Assessment: Generalized weakness    Cervical / Trunk Assessment Cervical / Trunk Assessment: Normal  Communication   Communication: No difficulties  Cognition  Arousal/Alertness: Awake/alert Behavior During Therapy: WFL for tasks assessed/performed Overall Cognitive Status: Within Functional Limits for tasks assessed                                        General Comments      Exercises     Assessment/Plan    PT Assessment Patient needs continued PT services  PT Problem List Decreased strength;Decreased activity tolerance;Decreased balance;Decreased mobility;Cardiopulmonary status limiting activity       PT Treatment Interventions Gait training;Functional mobility training;Stair training;Therapeutic activities;Therapeutic exercise;Patient/family education    PT Goals (Current goals can be found in the Care Plan section)  Acute Rehab PT Goals Patient Stated Goal: return home  PT Goal Formulation: With patient Time For Goal Achievement: 07/27/17 Potential to Achieve Goals: Good    Frequency Min 3X/week   Barriers to discharge        Co-evaluation               AM-PAC PT "6 Clicks" Daily Activity  Outcome Measure Difficulty turning over in bed (including adjusting bedclothes, sheets and blankets)?: None Difficulty moving from lying on back to sitting on the side of the bed? : None Difficulty sitting down on and standing up from a chair with arms (e.g., wheelchair, bedside commode, etc,.)?: None Help needed moving to and from a bed to chair (including a wheelchair)?: A Little Help needed walking in hospital room?: A Little Help needed climbing 3-5 steps with a railing? : A Little 6 Click Score: 21    End of Session   Activity Tolerance: Patient limited by fatigue(Patient limited secondary to SOB) Patient left: in bed;with call bell/phone within reach(seated at bedside) Nurse Communication: Mobility status PT Visit Diagnosis: Unsteadiness on feet (R26.81);Other abnormalities of gait and mobility (R26.89);Muscle weakness (generalized) (M62.81)    Time: 6599-3570 PT Time Calculation (min) (ACUTE ONLY):  31 min   Charges:   PT Evaluation $PT Eval Moderate Complexity: 1 Mod PT Treatments $Therapeutic Activity: 8-22 mins   PT G Codes:        2:52 PM, Aug 09, 2017 Lonell Grandchild, MPT Physical Therapist with [facility] [phone] office [phone] mobile phone

## 2017-07-23 NOTE — Progress Notes (Signed)
[facility] PROGRESS NOTE  Gloria Calderon YYT:[id number] DOB: [birth year] DOA: 07/20/2017 PCP: Octavio Graves, DO  Brief summary   51 year old female with COPD and ongoing heavy tobacco use presented with acute COPD exacerbation and hypoxic respiratory failure.  Assessment/Plan:  COPD with acute exacerbation ([place]) Acute respiratory failure with hypoxia  -slowly improving. Cont IV Solu Medrol tapering, cont DuoNeb's to every 4 hours And when necessary albuterol nebs Every 2 hours. Continue azithromycin. Add Mucinex. Counseled strongly on smoking cessation. Lives with her mother and mother's boyfriend both of who smoke. Continue nicotine patch. Assess need for home O2 prior to discharge.   Hypothyroidism Continue Synthroid  Oral thrush . Continue nystatin.  Code Status: ful Family Communication: d/w patient, RN (indicate person spoken with, relationship, and if by phone, the number) Disposition Plan: home when stable    Consultants:  None   Procedures:  none  Antibiotics:  Azithromycin 11/6>>> (indicate start date, and stop date if known)  HPI/Subjective: Alert. Reports shortness of breath, chronic. No acute chest pains   Objective: Vitals:   07/23/17 0523 07/23/17 0822  BP: 132/82   Pulse: 88   Resp: 18   Temp: 98.2 F (36.8 C)   SpO2: 98% 98%    Intake/Output Summary (Last 24 hours) at 07/23/2017 0903 Last data filed at 07/22/2017 1700 Gross per 24 hour  Intake 240 ml  Output -  Net 240 ml   Filed Weights   07/20/17 1845 07/21/17 0037  Weight: 59.4 kg (131 lb) 60.1 kg (132 lb 7.9 oz)    Exam:   General:  No distress   Cardiovascular: s1,s2 rrr  Respiratory: poor ventilation bl   Abdomen: soft, nt, nd n  Musculoskeletal: no leg edema    Data Reviewed: Basic Metabolic Panel: Recent Labs  Lab 07/20/17 2033 07/21/17 0439  NA 140 140  K 4.4 4.8  CL 99* 102  CO2 32 30  GLUCOSE 101* 180*  BUN 14 20  CREATININE 0.79 0.63  CALCIUM  8.5* 7.5*   Liver Function Tests: No results for input(s): AST, ALT, ALKPHOS, BILITOT, PROT, ALBUMIN in the last 168 hours. No results for input(s): LIPASE, AMYLASE in the last 168 hours. No results for input(s): AMMONIA in the last 168 hours. CBC: Recent Labs  Lab 07/20/17 2033 07/21/17 0439  WBC 10.7* 7.7  NEUTROABS 7.5 7.1  HGB 15.9* 14.2  HCT 49.1* 44.9  MCV 103.4* 104.7*  PLT 260 240   Cardiac Enzymes: Recent Labs  Lab 07/20/17 2033  TROPONINI <0.03   BNP (last 3 results) No results for input(s): BNP in the last 8760 hours.  ProBNP (last 3 results) No results for input(s): PROBNP in the last 8760 hours.  CBG: No results for input(s): GLUCAP in the last 168 hours.  Recent Results (from the past 240 hour(s))  Culture, expectorated sputum-assessment     Status: None   Collection Time: 07/21/17  2:56 PM  Result Value Ref Range Status   Specimen Description EXPECTORATED SPUTUM  Final   Special Requests Immunocompromised  Final   Sputum evaluation   Final    THIS SPECIMEN IS ACCEPTABLE FOR SPUTUM CULTURE PERFORMED AT [facility]    Report Status 07/22/2017 FINAL  Final  Culture, respiratory (NON-Expectorated)     Status: None (Preliminary result)   Collection Time: 07/21/17  2:56 PM  Result Value Ref Range Status   Specimen Description EXPECTORATED SPUTUM  Final   Special Requests Immunocompromised Reflexed from [id number]  Final   Gram Stain  Final    FEW WBC PRESENT, PREDOMINANTLY PMN ABUNDANT GRAM NEGATIVE RODS MODERATE GRAM POSITIVE COCCI IN PAIRS IN CLUSTERS FEW GRAM NEGATIVE COCCOBACILLI Performed at [facility], [place] [address], [place], [place] [address]    Culture PENDING  Incomplete   Report Status PENDING  Incomplete     Studies: No results found.  Scheduled Meds: . azithromycin  500 mg Oral Daily  . cyclobenzaprine  10 mg Oral QHS  . enoxaparin (LOVENOX) injection  40 mg Subcutaneous Q24H  . guaiFENesin  600 mg Oral BID  .  ipratropium-albuterol  3 mL Nebulization Q4H  . levothyroxine  25 mcg Oral QAC breakfast  . mouth rinse  15 mL Mouth Rinse BID  . methylPREDNISolone (SOLU-MEDROL) injection  80 mg Intravenous Q6H  . nystatin  5 mL Oral QID  . pantoprazole  40 mg Oral Daily  . senna-docusate  1 tablet Oral BID   Continuous Infusions:  Principal Problem:   COPD with acute exacerbation (HCC) Active Problems:   Thrush   Tobacco abuse   Polycythemia    Time spent: >35 minutes     Kinnie Feil  [facility] Pager [phone]. If 7PM-7AM, please contact night-coverage at [email or web address], password [facility] 07/23/2017, 9:03 AM  LOS: 2 days

## 2017-07-23 NOTE — Progress Notes (Signed)
SATURATION QUALIFICATIONS: (This note is used to comply with regulatory documentation for home oxygen)  Patient Saturations on Room Air at Rest = 94%  Patient Saturations on Room Air while Ambulating = 94%  Patient Saturations on  Liters of oxygen while Ambulating = na   Please briefly explain why patient needs home oxygen:na

## 2017-07-24 DIAGNOSIS — Z720 Tobacco use: Secondary | ICD-10-CM

## 2017-07-24 DIAGNOSIS — R0602 Shortness of breath: Secondary | ICD-10-CM

## 2017-07-24 DIAGNOSIS — J441 Chronic obstructive pulmonary disease with (acute) exacerbation: Principal | ICD-10-CM

## 2017-07-24 MED ORDER — IPRATROPIUM-ALBUTEROL 0.5-2.5 (3) MG/3ML IN SOLN: 3.0000 mL | Freq: Four times a day (QID) | RESPIRATORY_TRACT | 1 refills | Status: DC | PRN

## 2017-07-24 MED ORDER — PREDNISONE 20 MG PO TABS: 40.0000 mg | ORAL_TABLET | Freq: Every day | ORAL | 0 refills | Status: AC

## 2017-07-24 MED ORDER — TIOTROPIUM BROMIDE MONOHYDRATE 18 MCG IN CAPS: 18.0000 ug | ORAL_CAPSULE | Freq: Every day | RESPIRATORY_TRACT | 12 refills | Status: DC

## 2017-07-24 MED ORDER — BUDESONIDE-FORMOTEROL FUMARATE 160-4.5 MCG/ACT IN AERO: 2.0000 | INHALATION_SPRAY | Freq: Two times a day (BID) | RESPIRATORY_TRACT | 12 refills | Status: DC

## 2017-07-24 MED ORDER — AMOXICILLIN-POT CLAVULANATE 875-125 MG PO TABS: 1.0000 | ORAL_TABLET | Freq: Two times a day (BID) | ORAL | 0 refills | Status: DC

## 2017-07-24 MED ORDER — ALBUTEROL SULFATE HFA 108 (90 BASE) MCG/ACT IN AERS: 2.0000 | INHALATION_SPRAY | Freq: Four times a day (QID) | RESPIRATORY_TRACT | 1 refills | Status: DC | PRN

## 2017-07-24 MED ORDER — GUAIFENESIN ER 600 MG PO TB12: 600.0000 mg | ORAL_TABLET | Freq: Two times a day (BID) | ORAL | 0 refills | Status: DC

## 2017-07-24 NOTE — Progress Notes (Signed)
Physical Therapy Treatment Patient Details Name: Gloria Calderon MRN: [id number] DOB: 08-Apr-1966 Today's Date: 07/24/2017    History of Present Illness Gloria Calderon is a 51 y.o. female with medical history significant of constipation, spinal stenosis, tobacco use, emphysema who is coming to the emergency department with progressively worsening dyspnea associated with productive yellowish sputum despite taking an antibiotic and prednisone called in by her PCP about a week ago.  No known sick contacts or travel history.  She denies fever, chills, night sweats, palpitations, chest pain, dizziness, abdominal pain, nausea, emesis, diarrhea, melena or hematochezia.  The patient states that she has frequent constipation.  No dysuria, frequency or hematuria.    PT Comments    Patient demonstrates increased tolerance for gait training while on room air, completed exercises on room air with O2 sats at 94%, during gait training O2 sats dropped to 84%, returned to 94% after sitting in chair and breathing slowly for 3-4 minutes.  Overall patient desaturates with exertion, Ok at rest. Patient instructed in and given written instructions for HEP with understanding acknowledged and she declined home health PT stating she can do HEP on her own.    Follow Up Recommendations  No PT follow up;Supervision - Intermittent     Equipment Recommendations       Recommendations for Other Services       Precautions / Restrictions Precautions Precautions: Fall Precaution Comments: monitor O2 sats Restrictions Weight Bearing Restrictions: No    Mobility  Bed Mobility Overal bed mobility: Modified Independent                Transfers Overall transfer level: Needs assistance Equipment used: None Transfers: Sit to/from Stand;Stand Pivot Transfers Sit to Stand: Supervision Stand pivot transfers: Supervision       General transfer comment: slightly labored slow  movement  Ambulation/Gait Ambulation/Gait assistance: Supervision Ambulation Distance (Feet): 75 Feet Assistive device: None Gait Pattern/deviations: Decreased step length - right;Decreased step length - left;Decreased stride length Gait velocity: slow Gait velocity interpretation: Below normal speed for age/gender General Gait Details: Patient demonstrates slow slighlty labored cadence, had to take 1 standing rest break leaning against wall before walking back to bedside, on room air throughout with O2 Sats dropping to 84%    Stairs            Wheelchair Mobility    Modified Rankin (Stroke Patients Only)       Balance Overall balance assessment: Needs assistance Sitting-balance support: No upper extremity supported;Feet supported Sitting balance-Leahy Scale: Good     Standing balance support: No upper extremity supported;During functional activity Standing balance-Leahy Scale: Fair                              Cognition Arousal/Alertness: Awake/alert Behavior During Therapy: WFL for tasks assessed/performed Overall Cognitive Status: Within Functional Limits for tasks assessed                                        Exercises General Exercises - Lower Extremity Ankle Circles/Pumps: Seated;AROM;Strengthening;Both;10 reps Long Arc Quad: Seated;AROM;Strengthening;10 reps Hip Flexion/Marching: Seated;AROM;Strengthening;10 reps    General Comments        Pertinent Vitals/Pain Pain Assessment: No/denies pain    Home Living  Prior Function            PT Goals (current goals can now be found in the care plan section) Acute Rehab PT Goals Patient Stated Goal: return home  PT Goal Formulation: With patient Time For Goal Achievement: 07/27/17 Potential to Achieve Goals: Good Progress towards PT goals: Progressing toward goals    Frequency    Min 3X/week      PT Plan Current plan remains  appropriate    Co-evaluation              AM-PAC PT "6 Clicks" Daily Activity  Outcome Measure  Difficulty turning over in bed (including adjusting bedclothes, sheets and blankets)?: None Difficulty moving from lying on back to sitting on the side of the bed? : None Difficulty sitting down on and standing up from a chair with arms (e.g., wheelchair, bedside commode, etc,.)?: None Help needed moving to and from a bed to chair (including a wheelchair)?: A Little Help needed walking in hospital room?: A Little Help needed climbing 3-5 steps with a railing? : A Little 6 Click Score: 21    End of Session   Activity Tolerance: Patient tolerated treatment well;Patient limited by fatigue Patient left: in chair;with call bell/phone within reach Nurse Communication: Mobility status PT Visit Diagnosis: Unsteadiness on feet (R26.81);Other abnormalities of gait and mobility (R26.89);Muscle weakness (generalized) (M62.81)     Time: 7741-4239 PT Time Calculation (min) (ACUTE ONLY): 34 min  Charges:  $Therapeutic Exercise: 8-22 mins $Therapeutic Activity: 8-22 mins                    G Codes:       12:14 PM, 08-09-17 Lonell Grandchild, MPT Physical Therapist with [facility] [phone] office [phone] mobile phone

## 2017-07-24 NOTE — Discharge Summary (Signed)
Physician Discharge Summary  Gloria Calderon DXA:[id number] DOB: [birth year] DOA: 07/20/2017  PCP: Octavio Graves, DO  Admit date: 07/20/2017 Discharge date: 07/24/2017  Time spent: >35 minutes  Recommendations for Outpatient Follow-up:  PCP in 3-7 days as needed Discharge Diagnoses:  Principal Problem:   COPD with acute exacerbation [facility]) Active Problems:   Thrush   Tobacco abuse   Polycythemia   Discharge Condition: stable   Diet recommendation: regular   Filed Weights   07/20/17 1845 07/21/17 0037  Weight: 59.4 kg (131 lb) 60.1 kg (132 lb 7.9 oz)    History of present illness:  51 year old female with COPD and ongoing heavy tobacco use presented with acute COPD exacerbation and hypoxic respiratory failure. She presented with  progressively worsening dyspnea associated with productive yellowish sputum despite taking an antibiotic and prednisone called in by her PCP about a week ago.  No known sick contacts or travel history.   ED Course: Initial vital signs temperature 98.60F, pulse 101, respirations 20, blood pressure 128/88 mmHg and O2 sat 94% on room air.  The patient received supplemental oxygen, bronchodilators and Solu-Medrol in the emergency department   Hospital Course:   COPD with acute exacerbation ([place]) Acute respiratory failure with hypoxia. Patient continued to improve with IV Solu Medrol, DuoNeb's to every 4 hours And when necessary albuterol nebs Every 2 hours, augmentin, and Mucinex. Counseled strongly on smoking cessation. Lives with her mother and mother's boyfriend both of who smoke. Continue nicotine patch. She is transitioned to oral short course steroids at discharge. Will go home with O2, desat to <90% with ambulation recommended to f/u PCP in 3-7 days  Hypothyroidism Continue Synthroid  Oral thrush . Continue nystatin.    Procedures:  none (i.e. Studies not automatically included, echos, thoracentesis, etc; not  x-rays)  Consultations:  none  Discharge Exam: Vitals:   07/24/17 0622 07/24/17 0744  BP: (!) 143/89   Pulse: 89   Resp: 18   Temp: 97.7 F (36.5 C)   SpO2: 92% 97%    General: alert. No distress  Cardiovascular: s1,s2 rrr Respiratory: no wheezing   Discharge Instructions  Discharge Instructions    Diet - low sodium heart healthy   Complete by:  As directed    Increase activity slowly   Complete by:  As directed      Allergies as of 07/24/2017      Reactions   Levaquin [levofloxacin]    hives   Morphine And Related Itching, Nausea And Vomiting      Medication List    STOP taking these medications   clarithromycin 500 MG tablet Commonly known as:  BIAXIN   cyclobenzaprine 10 MG tablet Commonly known as:  FLEXERIL   fluticasone 50 MCG/ACT nasal spray Commonly known as:  FLONASE   guaiFENesin-codeine 100-10 MG/5ML syrup   lactulose 10 GM/15ML solution Commonly known as:  CHRONULAC   oxyCODONE 15 MG immediate release tablet Commonly known as:  ROXICODONE     TAKE these medications   albuterol 108 (90 Base) MCG/ACT inhaler Commonly known as:  PROVENTIL HFA;VENTOLIN HFA Inhale 2 puffs every 6 (six) hours as needed into the lungs for wheezing or shortness of breath. What changed:  Another medication with the same name was removed. Continue taking this medication, and follow the directions you see here.   alendronate 70 MG tablet Commonly known as:  FOSAMAX TAKE ONE TABLET BY MOUTH EVERY MONDAY   ALPRAZolam 1 MG tablet Commonly known as:  XANAX Take 2 tablets  by mouth 3 (three) times daily as needed for anxiety.   amoxicillin-clavulanate 875-125 MG tablet Commonly known as:  AUGMENTIN Take 1 tablet every 12 (twelve) hours by mouth.   BC HEADACHE 325-95-16 MG Tabs Generic drug:  Aspirin-Salicylamide-Caffeine Take 2 packets daily as needed by mouth (FOR PAIN).   budesonide-formoterol 160-4.5 MCG/ACT inhaler Commonly known as:  SYMBICORT Inhale 2  puffs 2 (two) times daily into the lungs.   [employer] FIBER GUMMIES PO Take 2 each daily by mouth.   docusate sodium 100 MG capsule Commonly known as:  COLACE Take 1 capsule (100 mg total) by mouth 2 (two) times daily as needed for mild constipation or moderate constipation. What changed:  when to take this   esomeprazole 40 MG capsule Commonly known as:  NEXIUM Take 20-40 mg daily by mouth.   guaiFENesin 600 MG 12 hr tablet Commonly known as:  MUCINEX Take 1 tablet (600 mg total) 2 (two) times daily by mouth.   ipratropium-albuterol 0.5-2.5 (3) MG/3ML Soln Commonly known as:  DUONEB Take 3 mLs every 6 (six) hours as needed by nebulization (shortness of breath).   levothyroxine 25 MCG tablet Commonly known as:  SYNTHROID, LEVOTHROID Take 1 Tablet by mouth every morning ON an EMPTY stomach   META BIOTIC/BIO-ACTIVE 12 PO Take 2 each daily by mouth. PRE/PROBIOTIC COMBINATION GUMMIES   MULTIVITAMIN & MINERAL PO Take 1 tablet by mouth daily.   predniSONE 20 MG tablet Commonly known as:  DELTASONE Take 2 tablets (40 mg total) daily with breakfast for 5 days by mouth. What changed:  See the new instructions.   tiotropium 18 MCG inhalation capsule Commonly known as:  SPIRIVA Place 1 capsule (18 mcg total) daily into inhaler and inhale.            Durable Medical Equipment  (From admission, onward)        Start     Ordered   07/24/17 0942  For home use only DME oxygen  Once    Question Answer Comment  Mode or (Route) Nasal cannula   Liters per Minute 2   Frequency Continuous (stationary and portable oxygen unit needed)   Oxygen delivery system Gas      07/24/17 0941     Allergies  Allergen Reactions  . Levaquin [Levofloxacin]     hives  . Morphine And Related Itching and Nausea And Vomiting      The results of significant diagnostics from this hospitalization (including imaging, microbiology, ancillary and laboratory) are listed below for reference.     Significant Diagnostic Studies: Dg Chest 2 View  Result Date: 07/20/2017 CLINICAL DATA:  Productive cough that has been unresponsive to antibiotic therapy and a 1 week course of corticosteroid therapy. EXAM: CHEST  2 VIEW COMPARISON:  07/29/2015, 10/03/2013. FINDINGS: Cardiomediastinal silhouette unremarkable, unchanged. Hyperinflation with emphysematous changes throughout both lungs, predominantly the upper lobes, unchanged. Prominent bronchovascular markings diffusely and moderate central peribronchial thickening, unchanged. Lungs otherwise clear. No localized airspace consolidation. No pleural effusions. No pneumothorax. Normal pulmonary vascularity. Degenerative changes involving the thoracic spine. IMPRESSION: 1.  No acute cardiopulmonary disease. 2.  Emphysema (ICD10-J43.9). Electronically Signed   By: Evangeline Dakin M.D.   On: 07/20/2017 19:29    Microbiology: Recent Results (from the past 240 hour(s))  Culture, expectorated sputum-assessment     Status: None   Collection Time: 07/21/17  2:56 PM  Result Value Ref Range Status   Specimen Description EXPECTORATED SPUTUM  Final   Special Requests Immunocompromised  Final  Sputum evaluation   Final    THIS SPECIMEN IS ACCEPTABLE FOR SPUTUM CULTURE PERFORMED AT [facility]    Report Status 07/22/2017 FINAL  Final  Culture, respiratory (NON-Expectorated)     Status: None (Preliminary result)   Collection Time: 07/21/17  2:56 PM  Result Value Ref Range Status   Specimen Description EXPECTORATED SPUTUM  Final   Special Requests Immunocompromised Reflexed from [id number]  Final   Gram Stain   Final    FEW WBC PRESENT, PREDOMINANTLY PMN ABUNDANT GRAM NEGATIVE RODS MODERATE GRAM POSITIVE COCCI IN PAIRS IN CLUSTERS FEW GRAM NEGATIVE COCCOBACILLI    Culture   Final    TOO YOUNG TO READ Performed at [facility], [place] [address] [place], [place] [address]    Report Status PENDING  Incomplete     Labs: Basic Metabolic Panel: Recent  Labs  Lab 07/20/17 2033 07/21/17 0439  NA 140 140  K 4.4 4.8  CL 99* 102  CO2 32 30  GLUCOSE 101* 180*  BUN 14 20  CREATININE 0.79 0.63  CALCIUM 8.5* 7.5*   Liver Function Tests: No results for input(s): AST, ALT, ALKPHOS, BILITOT, PROT, ALBUMIN in the last 168 hours. No results for input(s): LIPASE, AMYLASE in the last 168 hours. No results for input(s): AMMONIA in the last 168 hours. CBC: Recent Labs  Lab 07/20/17 2033 07/21/17 0439  WBC 10.7* 7.7  NEUTROABS 7.5 7.1  HGB 15.9* 14.2  HCT 49.1* 44.9  MCV 103.4* 104.7*  PLT 260 240   Cardiac Enzymes: Recent Labs  Lab 07/20/17 2033  TROPONINI <0.03   BNP: BNP (last 3 results) No results for input(s): BNP in the last 8760 hours.  ProBNP (last 3 results) No results for input(s): PROBNP in the last 8760 hours.  CBG: No results for input(s): GLUCAP in the last 168 hours.     SignedKinnie Feil  [facility] 07/24/2017, 9:41 AM

## 2017-07-24 NOTE — Progress Notes (Signed)
SATURATION QUALIFICATIONS: (This note is used to comply with regulatory documentation for home oxygen)  Patient Saturations on Room Air at Rest = 90%  Patient Saturations on Room Air while Ambulating = 84%  Patient Saturations on 2Liters of oxygen while Ambulating = 94%  Please briefly explain why patient needs home oxygen: 

## 2017-07-24 NOTE — Care Management Note (Signed)
Case Management Note  Patient Details  Name: Gloria Calderon MRN: [id number] Date of Birth: 1965-12-30  Expected Discharge Date:  07/24/17               Expected Discharge Plan:  Home/Self Care  In-House Referral:  NA  Discharge planning Services  CM Consult  Post Acute Care Choice:  Durable Medical Equipment Choice offered to:  Patient  DME Arranged:  Oxygen DME Agency:  [facility].  Status of Service:  Completed, signed off   Additional Comments: Discharging home today with self care. Pt will get home oxygen through [facility]. Vaughan Basta, [facility] rep, aware of referral and will pull pt info from chart and deliver port tank to pt room prior to DC. PT has recommended HH PT. Pt declines but requests print offs of excessive's be provided at DC and she will do them herself.  Sherald Barge, RN 07/24/2017, 10:05 AM

## 2017-07-24 NOTE — Discharge Summary (Signed)
Patient IV removed.  RN assessment and VS revealed stability for DC to home.  Discharge papers given, explained and educated. No FU appts needed at this time. Scripts e-scribed to [facility] , [place], [place]. Once ready, will be wheeled to pharm and family transporting home via car.

## 2017-07-25 LAB — CULTURE, RESPIRATORY

## 2017-07-25 LAB — CULTURE, RESPIRATORY W GRAM STAIN

## 2017-12-02 ENCOUNTER — Encounter (INDEPENDENT_AMBULATORY_CARE_PROVIDER_SITE_OTHER): Payer: Self-pay | Admitting: *Deleted

## 2017-12-06 ENCOUNTER — Encounter (HOSPITAL_COMMUNITY): Payer: Self-pay

## 2017-12-06 ENCOUNTER — Emergency Department (HOSPITAL_COMMUNITY)
Admission: EM | Admit: 2017-12-06 | Discharge: 2017-12-06 | Disposition: A | Discharge status: Home / Self Care | Payer: Medicaid Other | Attending: Emergency Medicine | Admitting: Emergency Medicine

## 2017-12-06 DIAGNOSIS — Z7982 Long term (current) use of aspirin: Secondary | ICD-10-CM | POA: Diagnosis not present

## 2017-12-06 DIAGNOSIS — E039 Hypothyroidism, unspecified: Secondary | ICD-10-CM | POA: Insufficient documentation

## 2017-12-06 DIAGNOSIS — R062 Wheezing: Secondary | ICD-10-CM | POA: Insufficient documentation

## 2017-12-06 DIAGNOSIS — Z79899 Other long term (current) drug therapy: Secondary | ICD-10-CM | POA: Insufficient documentation

## 2017-12-06 DIAGNOSIS — J029 Acute pharyngitis, unspecified: Secondary | ICD-10-CM | POA: Insufficient documentation

## 2017-12-06 DIAGNOSIS — F17210 Nicotine dependence, cigarettes, uncomplicated: Secondary | ICD-10-CM | POA: Insufficient documentation

## 2017-12-06 DIAGNOSIS — F17290 Nicotine dependence, other tobacco product, uncomplicated: Secondary | ICD-10-CM | POA: Diagnosis not present

## 2017-12-06 DIAGNOSIS — J449 Chronic obstructive pulmonary disease, unspecified: Secondary | ICD-10-CM | POA: Insufficient documentation

## 2017-12-06 LAB — RAPID STREP SCREEN (MED CTR MEBANE ONLY): Streptococcus, Group A Screen (Direct): NEGATIVE

## 2017-12-06 MED ORDER — IPRATROPIUM-ALBUTEROL 0.5-2.5 (3) MG/3ML IN SOLN
3.0000 mL | Freq: Once | RESPIRATORY_TRACT | Status: AC
  Administered 2017-12-06: 3 mL via RESPIRATORY_TRACT
  Filled 2017-12-06: qty 3

## 2017-12-06 MED ORDER — AMOXICILLIN 500 MG PO CAPS: 500.0000 mg | ORAL_CAPSULE | Freq: Three times a day (TID) | ORAL | 0 refills | Status: DC

## 2017-12-06 MED ORDER — AMOXICILLIN 250 MG PO CAPS
500.0000 mg | ORAL_CAPSULE | Freq: Once | ORAL | Status: AC
  Administered 2017-12-06: 500 mg via ORAL
  Filled 2017-12-06: qty 2

## 2017-12-06 MED ORDER — DEXAMETHASONE SODIUM PHOSPHATE 4 MG/ML IJ SOLN
8.0000 mg | Freq: Once | INTRAMUSCULAR | Status: AC
  Administered 2017-12-06: 8 mg via INTRAMUSCULAR
  Filled 2017-12-06: qty 2

## 2017-12-06 MED ORDER — ALBUTEROL SULFATE (2.5 MG/3ML) 0.083% IN NEBU
2.5000 mg | INHALATION_SOLUTION | Freq: Once | RESPIRATORY_TRACT | Status: AC
  Administered 2017-12-06: 2.5 mg via RESPIRATORY_TRACT
  Filled 2017-12-06: qty 3

## 2017-12-06 MED ORDER — MAGIC MOUTHWASH W/LIDOCAINE: 5.0000 mL | Freq: Three times a day (TID) | ORAL | 0 refills | Status: DC | PRN

## 2017-12-06 NOTE — Discharge Instructions (Addendum)
Tylenol every 4 hours if needed for fever.  Drink plenty of fluids.  Continue your albuterol nebulizer treatments 1 treatment every 4 hours for 2-3 days then you may use as needed.  Follow-up with your primary doctor this week for recheck or return to the ER for any worsening symptoms.

## 2017-12-06 NOTE — ED Triage Notes (Signed)
Pt reports that she feels like she has strep throat. Throat has been sore for 3 days

## 2017-12-07 NOTE — ED Provider Notes (Addendum)
[facility] EMERGENCY DEPARTMENT Provider Note   CSN: [id number] Arrival date & time: 12/06/17  1707     History   Chief Complaint Chief Complaint  Patient presents with  . Sore Throat    HPI Gloria Calderon is a 52 y.o. female.  HPI   Gloria Calderon is a 52 y.o. female who presents to the Emergency Department complaining of sore throat and cough.  Symptoms have been present for 3 days.  Sore throat described as gradual in onset.  She believes cough is associated with flare up of her COPD.  Was recently seen by PCP and completed a 5 days course of prednisone.  Using her albuterol inhaler as needed.  She is concerned that she has strep throat.  Denies fever, chest pain, shortness of breath, vomiting, abdominal pain and headache. Pt continues to smoke.   Past Medical History:  Diagnosis Date  . Chronic back pain   . Constipation 04/02/2017  . COPD (chronic obstructive pulmonary disease) ([place])   . Hypothyroidism   . Osteoporosis   . Spinal stenosis     Patient Active Problem List   Diagnosis Date Noted  . COPD with acute exacerbation ([place]) 07/20/2017  . Polycythemia 07/20/2017  . Constipation 04/02/2017  . COPD with exacerbation ([place]) 07/29/2015  . Tobacco abuse 07/29/2015  . COPD exacerbation ([place]) 07/29/2015  . Influenza with respiratory manifestations 10/11/2013  . COPD (chronic obstructive pulmonary disease) ([place]) 10/11/2013  . Thrush 10/11/2013    Past Surgical History:  Procedure Laterality Date  . APPENDECTOMY    . CHOLECYSTECTOMY       OB History    Gravida  1   Para  1   Term  1   Preterm      AB      Living  1     SAB      TAB      Ectopic      Multiple      Live Births               Home Medications    Prior to Admission medications   Medication Sig Start Date End Date Taking? Authorizing Provider  albuterol (PROVENTIL HFA;VENTOLIN HFA) 108 (90 Base) MCG/ACT inhaler Inhale 2 puffs every 6 (six) hours as needed into  the lungs for wheezing or shortness of breath. 07/24/17   Kinnie Feil, MD  alendronate (FOSAMAX) 70 MG tablet TAKE ONE TABLET BY MOUTH EVERY MONDAY 03/27/17   [provider]  ALPRAZolam Duanne Moron) 1 MG tablet Take 2 tablets by mouth 3 (three) times daily as needed for anxiety.     [provider]  amoxicillin (AMOXIL) 500 MG capsule Take 1 capsule (500 mg total) by mouth 3 (three) times daily. 12/06/17   Mirel Hundal, PA-C  amoxicillin-clavulanate (AUGMENTIN) 875-125 MG tablet Take 1 tablet every 12 (twelve) hours by mouth. 07/24/17   Kinnie Feil, MD  Aspirin-Salicylamide-Caffeine (BC HEADACHE) 325-95-16 MG TABS Take 2 packets daily as needed by mouth (FOR PAIN).    [provider]  budesonide-formoterol (SYMBICORT) 160-4.5 MCG/ACT inhaler Inhale 2 puffs 2 (two) times daily into the lungs. 07/24/17   Kinnie Feil, MD  [employer] FIBER GUMMIES PO Take 2 each daily by mouth.    [provider]  docusate sodium (COLACE) 100 MG capsule Take 1 capsule (100 mg total) by mouth 2 (two) times daily as needed for mild constipation or moderate constipation. Patient taking differently: Take 100 mg 2 (  two) times daily by mouth.  12/29/16   Elnora Morrison, MD  esomeprazole (NEXIUM) 40 MG capsule Take 20-40 mg daily by mouth.  03/06/17   [provider]  guaiFENesin (MUCINEX) 600 MG 12 hr tablet Take 1 tablet (600 mg total) 2 (two) times daily by mouth. 07/24/17   Buriev, Arie Sabina, MD  ipratropium-albuterol (DUONEB) 0.5-2.5 (3) MG/3ML SOLN Take 3 mLs every 6 (six) hours as needed by nebulization (shortness of breath). 07/24/17   Kinnie Feil, MD  levothyroxine (SYNTHROID, LEVOTHROID) 25 MCG tablet Take 1 Tablet by mouth every morning ON an EMPTY stomach 03/07/17   [provider]  magic mouthwash w/lidocaine SOLN Take 5 mLs by mouth 3 (three) times daily as needed for mouth pain. Swish and spit, do not swallow 12/06/17   Johnesha Acheampong, PA-C  Multiple  Vitamins-Minerals (MULTIVITAMIN & MINERAL PO) Take 1 tablet by mouth daily.    [provider]  Probiotic Product (META BIOTIC/BIO-ACTIVE 12 PO) Take 2 each daily by mouth. [place]    [provider]  tiotropium (SPIRIVA) 18 MCG inhalation capsule Place 1 capsule (18 mcg total) daily into inhaler and inhale. 07/24/17   Kinnie Feil, MD    Family History Family History  Problem Relation Age of Onset  . COPD Father   . Diabetes Mellitus II Mother     Social History Social History   Tobacco Use  . Smoking status: Current Every Day Smoker    Packs/day: 1.00    Years: 30.00    Pack years: 30.00    Types: Cigarettes, E-cigarettes  . Smokeless tobacco: Never Used  Substance Use Topics  . Alcohol use: No  . Drug use: No     Allergies   Levaquin [levofloxacin] and Morphine and related   Review of Systems Review of Systems  Constitutional: Negative for activity change, appetite change, chills and fever.  HENT: Positive for sore throat. Negative for congestion, ear pain, facial swelling, trouble swallowing and voice change.   Eyes: Negative for pain and visual disturbance.  Respiratory: Positive for cough and wheezing. Negative for shortness of breath and stridor.   Cardiovascular: Negative for chest pain.  Gastrointestinal: Negative for abdominal pain, nausea and vomiting.  Musculoskeletal: Negative for arthralgias, myalgias, neck pain and neck stiffness.  Skin: Negative for color change and rash.  Neurological: Negative for dizziness, facial asymmetry, speech difficulty, numbness and headaches.  Hematological: Negative for adenopathy.  All other systems reviewed and are negative.    Physical Exam Updated Vital Signs BP 130/81 (BP Location: Right Arm)   Pulse (!) 102   Temp 98.5 F (36.9 C) (Oral)   Resp 18   Ht 5\' 4"  (1.626 m)   Wt 63 kg (139 lb)   SpO2 96%   BMI 23.86 kg/m   Physical Exam  Constitutional: She is  oriented to person, place, and time. She appears well-developed and well-nourished. She does not appear ill. No distress.  HENT:  Head: Normocephalic.  Right Ear: Tympanic membrane and ear canal normal.  Left Ear: Tympanic membrane and ear canal normal.  Mouth/Throat: Uvula is midline and mucous membranes are normal. Posterior oropharyngeal erythema present. No oropharyngeal exudate, posterior oropharyngeal edema or tonsillar abscesses. No tonsillar exudate.  Neck: Normal range of motion.  Cardiovascular: Normal rate, regular rhythm and intact distal pulses.  Pulmonary/Chest: Effort normal. No respiratory distress. She has wheezes. She has no rales.  Abdominal: Soft. She exhibits no distension. There is no tenderness. There is  no guarding.  Musculoskeletal: Normal range of motion. She exhibits no edema.  No peripheral edema.  Lymphadenopathy:    She has cervical adenopathy.  Neurological: She is alert and oriented to person, place, and time.  Skin: Skin is warm. Capillary refill takes less than 2 seconds. No rash noted.  Psychiatric: She has a normal mood and affect.  Vitals reviewed.    ED Treatments / Results  Labs (all labs ordered are listed, but only abnormal results are displayed) Labs Reviewed  RAPID STREP SCREEN (NOT AT [facility])  CULTURE, GROUP A STREP [facility])    EKG None  Radiology No results found.   Procedures Procedures (including critical care time)  Medications Ordered in ED Medications  dexamethasone (DECADRON) injection 8 mg (8 mg Intramuscular Given 12/06/17 1852)  ipratropium-albuterol (DUONEB) 0.5-2.5 (3) MG/3ML nebulizer solution 3 mL (3 mLs Nebulization Given 12/06/17 1902)  albuterol (PROVENTIL) (2.5 MG/3ML) 0.083% nebulizer solution 2.5 mg (2.5 mg Nebulization Given 12/06/17 1901)  amoxicillin (AMOXIL) capsule 500 mg (500 mg Oral Given 12/06/17 1949)     Initial Impression / Assessment and Plan / ED Course  I have reviewed the triage vital signs and the  nursing notes.  Pertinent labs & imaging results that were available during my care of the patient were reviewed by me and considered in my medical decision making (see chart for details).     Pt well appearing.  Vitals reviewed.  Doubt PE.  Strep negative. Lung sounds improved after albuterol neb.  Has recently finished prednisone rx.  No respiratory distress, or hypoxia.  Appears safe for d/c home.  Pt agrees to tx plan and regular albuterol nebs for 3-4 days.  Return precautions discussed.  Final Clinical Impressions(s) / ED Diagnoses   Final diagnoses:  Pharyngitis, unspecified etiology  Wheezing    ED Discharge Orders        Ordered    magic mouthwash w/lidocaine SOLN  3 times daily PRN     12/06/17 1945    amoxicillin (AMOXIL) 500 MG capsule  3 times daily     12/06/17 1945       Kem Parkinson, PA-C 12/07/17 2223    Kem Parkinson, PA-C 12/07/17 2228    Isla Pence, MD 12/10/17 1101

## 2017-12-09 LAB — CULTURE, GROUP A STREP (THRC)

## 2017-12-17 ENCOUNTER — Ambulatory Visit: Payer: Self-pay | Admitting: Emergency Medicine

## 2017-12-21 ENCOUNTER — Encounter: Payer: Self-pay | Admitting: Emergency Medicine

## 2017-12-21 ENCOUNTER — Ambulatory Visit: Payer: Medicaid Other | Admitting: Emergency Medicine

## 2017-12-21 VITALS — BP 112/86 | HR 110 | Ht 64.0 in | Wt 137.0 lb

## 2017-12-21 DIAGNOSIS — J439 Emphysema, unspecified: Secondary | ICD-10-CM | POA: Diagnosis not present

## 2017-12-21 DIAGNOSIS — Z720 Tobacco use: Secondary | ICD-10-CM

## 2017-12-21 MED ORDER — NICOTINE 14 MG/24HR TD PT24: 14.0000 mg | MEDICATED_PATCH | Freq: Every day | TRANSDERMAL | 0 refills | Status: DC

## 2017-12-21 MED ORDER — FLUTICASONE-UMECLIDIN-VILANT 100-62.5-25 MCG/INH IN AEPB: 1.0000 | INHALATION_SPRAY | Freq: Every day | RESPIRATORY_TRACT | 0 refills | Status: DC

## 2017-12-21 NOTE — Assessment & Plan Note (Signed)
  Agree with your efforts to stop smoking.  Now that you cut down to half a pack a day, recommend that you set a quit date and on the night before start nicotine patches 14 mg once daily.  Ultimately will also try to wean the nicotine patch down to 7 mg daily and then to off.

## 2017-12-21 NOTE — Assessment & Plan Note (Signed)
Please stop bevespi for now We will try replacing this with Trelegy 1 inhalation once a day.  Remember to rinse and gargle after using this medicine. You may use albuterol either 2 puffs or 1 nebulizer treatment up to every 4 hours if needed for shortness of breath, wheezing, chest tightness. Continue your oxygen as you are using it, 2.5 L/min.  We will work on getting a portable oxygen concentrator from [employer] Agree with your efforts to stop smoking.  Now that you cut down to half a pack a day, recommend that you set a quit date and on the night before start nicotine patches 14 mg once daily.  Ultimately will also try to wean the nicotine patch down to 7 mg daily and then to off. Follow with Dr Lamonte Sakai next available to assess your status on the new inhaled medication

## 2017-12-21 NOTE — Progress Notes (Signed)
Subjective:    Patient ID: Gloria Calderon, female    DOB: [birth year], 52 y.o.   MRN: [id number]  COPD  She complains of cough, shortness of breath and wheezing. Associated symptoms include postnasal drip. Pertinent negatives include no appetite change, ear pain, fever, headaches, rhinorrhea, sneezing, sore throat or trouble swallowing. Her past medical history is significant for COPD.   52 yo woman, smoker 60 pk-yrs, hx of allergies. She is referred for COPD after a recent trip to ED. She averages an episode of bronchitis annually, was started on abx. She continued to cough and have trouble prompting trip to the [facility] ED on 10/03/13. She finished abx and prednisone. Her breathing has improved. Continues to have aches, fever, HA, now with N/V.     ROV 02/05/15 -- history of active smoking, associated COPD that has been difficult to control with frequent episodes of bronchitis and exacerbations. FEV1 1.48 L (53% pred) in 4/'15.  She continues to smoke, has had ups / downs. She had a flare in April for which she received clarithro + cough syrup from PCP. Course c/b suspected thrush for which we treated.  Since then tongue rough and throat sore, raw, hoarse voice. Some congestion.   ROV 12/21/17 --patient follows up today for history of tobacco use (62 pack years, currently smoking), COPD with an FEV1 of 1.48 L (53% predicted) in 12/2013.  She had been managed on Symbicort and Spiriva, stopped the symbicort 2 months ago. She did a trial of Bevespi, doesn't believe it is as helpful as Spiriva.  She has frequent exacerbations, was admitted most recently 07/2017, seen in the emergency department 12/06/17. She was started on O2 2.5L/min at all times. She is on medicaid right now, some concern as to how to qualify to keep it. Her last pred was 2-3 weeks ago, requires frequently. She has exertional dyspnea even on her o2.  She has cut her cigarettes down to 1/2 a pk a day.    Review of Systems  Constitutional:  Negative for appetite change, fever and unexpected weight change.  HENT: Positive for congestion and postnasal drip. Negative for dental problem, ear pain, nosebleeds, rhinorrhea, sinus pressure, sneezing, sore throat and trouble swallowing.   Eyes: Negative for redness and itching.  Respiratory: Positive for cough, chest tightness, shortness of breath and wheezing.   Cardiovascular: Negative for palpitations and leg swelling.  Gastrointestinal: Negative for abdominal pain, nausea and vomiting.  Genitourinary: Negative for dysuria.  Musculoskeletal: Negative for joint swelling.  Skin: Negative for rash.  Neurological: Negative for headaches.  Hematological: Does not bruise/bleed easily.  Psychiatric/Behavioral: Negative for dysphoric mood. The patient is not nervous/anxious.        Objective:   Physical Exam Vitals:   12/21/17 1409  BP: 112/86  Pulse: (!) 110  SpO2: 99%  Weight: 137 lb (62.1 kg)  Height: 5\' 4"  (1.626 m)   Gen: Pleasant, well-nourished, in no distress, ill appearing  ENT: No lesions,  mouth clear,  oropharynx clear, no postnasal drip , tongue has a mild brown coating, no other signs thrush in the post pharynx.   Neck: No JVD, no stridor  Lungs: No use of accessory muscles, no wheeze, distant  Cardiovascular: RRR, heart sounds normal, no murmur or gallops, no peripheral edema  Musculoskeletal: No deformities, no cyanosis or clubbing  Neuro: alert, non focal  Skin: Warm, no lesions or rashes      Assessment & Plan:  COPD (chronic obstructive pulmonary disease) (HCC) Please stop  bevespi for now We will try replacing this with Trelegy 1 inhalation once a day.  Remember to rinse and gargle after using this medicine. You may use albuterol either 2 puffs or 1 nebulizer treatment up to every 4 hours if needed for shortness of breath, wheezing, chest tightness. Continue your oxygen as you are using it, 2.5 L/min.  We will work on getting a portable oxygen  concentrator from [employer] Agree with your efforts to stop smoking.  Now that you cut down to half a pack a day, recommend that you set a quit date and on the night before start nicotine patches 14 mg once daily.  Ultimately will also try to wean the nicotine patch down to 7 mg daily and then to off. Follow with Dr Lamonte Sakai next available to assess your status on the new inhaled medication  Tobacco abuse  Agree with your efforts to stop smoking.  Now that you cut down to half a pack a day, recommend that you set a quit date and on the night before start nicotine patches 14 mg once daily.  Ultimately will also try to wean the nicotine patch down to 7 mg daily and then to off.  Baltazar Apo, MD, PhD 12/21/2017, 2:34 PM [place] [phone] or if no answer [phone]

## 2017-12-21 NOTE — Patient Instructions (Signed)
Please stop bevespi for now We will try replacing this with Trelegy 1 inhalation once a day.  Remember to rinse and gargle after using this medicine. You may use albuterol either 2 puffs or 1 nebulizer treatment up to every 4 hours if needed for shortness of breath, wheezing, chest tightness. Continue your oxygen as you are using it, 2.5 L/min.  We will work on getting a portable oxygen concentrator from [employer] Agree with your efforts to stop smoking.  Now that you cut down to half a pack a day, recommend that you set a quit date and on the night before start nicotine patches 14 mg once daily.  Ultimately will also try to wean the nicotine patch down to 7 mg daily and then to off. Follow with Gloria Calderon next available to assess your status on the new inhaled medication

## 2017-12-22 ENCOUNTER — Telehealth: Payer: Self-pay | Admitting: Emergency Medicine

## 2017-12-22 NOTE — Telephone Encounter (Signed)
Called [place] but no answer. Left message for Corene Cornea to call back.

## 2017-12-23 NOTE — Telephone Encounter (Signed)
Called and spoke with patient and got her scheduled for a qualifying walk for Friday 4/12 at 2:30pm.   Spoke with Corene Cornea at [facility] and explained that patient needed to come in for a qualifying walk and will be completing this on 4/12. Corene Cornea stated that now that patient has insurance he can't process the order for the POC without patient being qualified. Nothing further is needed at this time.

## 2017-12-23 NOTE — Telephone Encounter (Signed)
Corene Cornea called back - he is only needing to have the qualifying Sat's to be placed in snapshot. He doesn't need a call back - if necessary he can be reached at [phone] x [phone]

## 2017-12-25 ENCOUNTER — Ambulatory Visit (INDEPENDENT_AMBULATORY_CARE_PROVIDER_SITE_OTHER): Payer: Medicaid Other | Admitting: *Deleted

## 2017-12-25 ENCOUNTER — Other Ambulatory Visit: Payer: Self-pay

## 2017-12-25 DIAGNOSIS — J439 Emphysema, unspecified: Secondary | ICD-10-CM

## 2017-12-25 DIAGNOSIS — J449 Chronic obstructive pulmonary disease, unspecified: Secondary | ICD-10-CM

## 2018-01-12 ENCOUNTER — Encounter: Payer: Self-pay | Admitting: Emergency Medicine

## 2018-01-12 ENCOUNTER — Ambulatory Visit: Payer: Medicaid Other | Admitting: Emergency Medicine

## 2018-01-12 DIAGNOSIS — J438 Other emphysema: Secondary | ICD-10-CM | POA: Diagnosis not present

## 2018-01-12 DIAGNOSIS — J9611 Chronic respiratory failure with hypoxia: Secondary | ICD-10-CM | POA: Insufficient documentation

## 2018-01-12 DIAGNOSIS — F419 Anxiety disorder, unspecified: Secondary | ICD-10-CM | POA: Insufficient documentation

## 2018-01-12 DIAGNOSIS — Z720 Tobacco use: Secondary | ICD-10-CM | POA: Diagnosis not present

## 2018-01-12 MED ORDER — FLUTICASONE-UMECLIDIN-VILANT 100-62.5-25 MCG/INH IN AEPB: 1.0000 | INHALATION_SPRAY | Freq: Every day | RESPIRATORY_TRACT | 5 refills | Status: DC

## 2018-01-12 MED ORDER — ALPRAZOLAM 1 MG PO TABS: 1.0000 mg | ORAL_TABLET | Freq: Four times a day (QID) | ORAL | 0 refills | Status: AC | PRN

## 2018-01-12 NOTE — Progress Notes (Signed)
Subjective:    Patient ID: Gloria Calderon, female    DOB: 1966-06-22, 52 y.o.   MRN: [id number]  COPD  She complains of cough, shortness of breath and wheezing. Associated symptoms include postnasal drip. Pertinent negatives include no appetite change, ear pain, fever, headaches, rhinorrhea, sneezing, sore throat or trouble swallowing. Her past medical history is significant for COPD.   52 yo woman, smoker 60 pk-yrs, hx of allergies. She is referred for COPD after a recent trip to ED. She averages an episode of bronchitis annually, was started on abx. She continued to cough and have trouble prompting trip to the [facility] ED on 10/03/13. She finished abx and prednisone. Her breathing has improved. Continues to have aches, fever, HA, now with N/V.     ROV 02/05/15 -- history of active smoking, associated COPD that has been difficult to control with frequent episodes of bronchitis and exacerbations. FEV1 1.48 L (53% pred) in 4/'15.  She continues to smoke, has had ups / downs. She had a flare in April for which she received clarithro + cough syrup from PCP. Course c/b suspected thrush for which we treated.  Since then tongue rough and throat sore, raw, hoarse voice. Some congestion.   ROV 12/21/17 --patient follows up today for history of tobacco use (62 pack years, currently smoking), COPD with an FEV1 of 1.48 L (53% predicted) in 12/2013.  She had been managed on Symbicort and Spiriva, stopped the symbicort 2 months ago. She did a trial of Bevespi, doesn't believe it is as helpful as Spiriva.  She has frequent exacerbations, was admitted most recently 07/2017, seen in the emergency department 12/06/17. She was started on O2 2.5L/min at all times. She is on medicaid right now, some concern as to how to qualify to keep it. Her last pred was 2-3 weeks ago, requires frequently. She has exertional dyspnea even on her o2.  She has cut her cigarettes down to 1/2 a pk a day.   ROV 01/12/18 --52 year old smoker with a  history of COPD and severe obstruction documented on prior pulmonary function testing.  I saw her earlier this month and we undertook a trial of Trelegy to see if she would benefit (as a substitute for bevespi).  We also worked on changing her to a Therapist, nutritional - she has it now, set on 3L/min pulsed. She feels better on the Trelegy, breathing better, helps her clear secretions better. She is using Duoneb about once a day now. She is doing fairly well currently. Notes that she is about to lose medicaid. She has cut her cigarettes down to 10/dayu, also using vapor. She is interested in getting her xanax increased (stopping smoking has increased her anxiety). She is planning to discuss with Dr Melina Copa in the near future when she returns from vacation.    Review of Systems  Constitutional: Negative for appetite change, fever and unexpected weight change.  HENT: Positive for congestion and postnasal drip. Negative for dental problem, ear pain, nosebleeds, rhinorrhea, sinus pressure, sneezing, sore throat and trouble swallowing.   Eyes: Negative for redness and itching.  Respiratory: Positive for cough, chest tightness, shortness of breath and wheezing.   Cardiovascular: Negative for palpitations and leg swelling.  Gastrointestinal: Negative for abdominal pain, nausea and vomiting.  Genitourinary: Negative for dysuria.  Musculoskeletal: Negative for joint swelling.  Skin: Negative for rash.  Neurological: Negative for headaches.  Hematological: Does not bruise/bleed easily.  Psychiatric/Behavioral: Negative for dysphoric mood. The patient is not nervous/anxious.  Objective:   Physical Exam Vitals:   01/12/18 1137 01/12/18 1138  BP:  132/88  Pulse:  (!) 113  SpO2:  99%  Weight: 141 lb (64 kg)    Gen: Pleasant, well-nourished, in no distress, ill appearing  ENT: No lesions,  mouth clear,  oropharynx clear, no postnasal drip , tongue has a mild brown coating, no other signs  thrush in the post pharynx.   Neck: No JVD, no stridor  Lungs: No use of accessory muscles, no wheeze, distant  Cardiovascular: RRR, heart sounds normal, no murmur or gallops, no peripheral edema  Musculoskeletal: No deformities, no cyanosis or clubbing  Neuro: alert, non focal  Skin: Warm, no lesions or rashes      Assessment & Plan:  Tobacco abuse Discussed cessation in detail with her today.  She is down to half a pack a day, is using nicotine vapor as an adjunct.  I have encouraged her to get down to 5 cigarettes daily before we try to seriously set a quit date.  COPD (chronic obstructive pulmonary disease) (HCC) We will continue Trelegy 1 inhalation once a day.  Remember to rinse and gargle after using this medication. Keep DuoNeb available to use if needed for shortness of breath, chest tightness, wheezing. Continue your oxygen at 2.5 to 3 L/min as you have been using it.   Chronic respiratory failure with hypoxia (HCC) Continue your oxygen at 2.5 to 3 L/min as you have been using it.  Anxiety disorder She requested an increase of her Xanax, increased anxiety in association with her decreased smoking.  I believe that ultimately this needs to be managed by Dr. Melina Copa.  Dr. Melina Copa is not available currently and I will give her a prescription for increased frequency per patient request.  One-time prescription to see if is beneficial.  She then needs to discuss with Dr. Melina Copa to decide what the appropriate regimen should be.  We will give you a one-time prescription for an increase in your Xanax dosing.  It may not be possible for you to fill this before your existing prescription expires.  You will need to discuss with Dr. Melina Copa when she is available to determine whether this is been helpful and whether it is safe for you to continue at the increased dose.  We will not be able to refill this prescription for you at [facility].  Baltazar Apo, MD, PhD 01/12/2018, 12:11  PM [place] [facility] [phone] or if no answer [phone]

## 2018-01-12 NOTE — Patient Instructions (Addendum)
We will continue Trelegy 1 inhalation once a day.  Remember to rinse and gargle after using this medication. Keep DuoNeb available to use if needed for shortness of breath, chest tightness, wheezing. Continue your oxygen at 2.5 to 3 L/min as you have been using it. Continue to work on decreasing your cigarettes.  Wants to get down to about 5 cigarettes daily we can discuss setting a quit date. We will give you a one-time prescription for an increase in your Xanax dosing.  It may not be possible for you to fill this before your existing prescription expires.  You will need to discuss with Dr. Melina Copa when she is available to determine whether this is been helpful and whether it is safe for you to continue at the increased dose.  We will not be able to refill this prescription for you at [facility]. Follow with Dr Lamonte Sakai in 4 months or sooner if you have any problems.

## 2018-01-12 NOTE — Assessment & Plan Note (Signed)
She requested an increase of her Xanax, increased anxiety in association with her decreased smoking.  I believe that ultimately this needs to be managed by Dr. Melina Copa.  Dr. Melina Copa is not available currently and I will give her a prescription for increased frequency per patient request.  One-time prescription to see if is beneficial.  She then needs to discuss with Dr. Melina Copa to decide what the appropriate regimen should be.  We will give you a one-time prescription for an increase in your Xanax dosing.  It may not be possible for you to fill this before your existing prescription expires.  You will need to discuss with Dr. Melina Copa when she is available to determine whether this is been helpful and whether it is safe for you to continue at the increased dose.  We will not be able to refill this prescription for you at [facility].

## 2018-01-12 NOTE — Assessment & Plan Note (Signed)
Discussed cessation in detail with her today.  She is down to half a pack a day, is using nicotine vapor as an adjunct.  I have encouraged her to get down to 5 cigarettes daily before we try to seriously set a quit date.

## 2018-01-12 NOTE — Assessment & Plan Note (Signed)
We will continue Trelegy 1 inhalation once a day.  Remember to rinse and gargle after using this medication. Keep DuoNeb available to use if needed for shortness of breath, chest tightness, wheezing. Continue your oxygen at 2.5 to 3 L/min as you have been using it.

## 2018-01-12 NOTE — Assessment & Plan Note (Signed)
Continue your oxygen at 2.5 to 3 L/min as you have been using it.

## 2018-01-13 ENCOUNTER — Other Ambulatory Visit: Payer: Self-pay

## 2018-01-13 ENCOUNTER — Other Ambulatory Visit: Payer: Self-pay | Admitting: *Deleted

## 2018-01-13 MED ORDER — FLUTICASONE-UMECLIDIN-VILANT 100-62.5-25 MCG/INH IN AEPB: 1.0000 | INHALATION_SPRAY | Freq: Every day | RESPIRATORY_TRACT | 3 refills | Status: DC

## 2018-01-13 MED ORDER — FLUTICASONE-UMECLIDIN-VILANT 100-62.5-25 MCG/INH IN AEPB: 1.0000 | INHALATION_SPRAY | Freq: Every day | RESPIRATORY_TRACT | 11 refills | Status: DC

## 2018-01-22 ENCOUNTER — Telehealth: Payer: Self-pay | Admitting: Emergency Medicine

## 2018-01-22 NOTE — Telephone Encounter (Signed)
I called [employer] and they are closed. Will initiate on Monday.

## 2018-01-25 NOTE — Telephone Encounter (Signed)
These forms were completed and faxed to [facility].

## 2018-01-25 NOTE — Telephone Encounter (Signed)
Patient calling back - she can be reached at [phone]

## 2018-01-25 NOTE — Telephone Encounter (Signed)
Spoke with pt, states that she is aware that she does not have Medicaid coverage at this time- states she will have [employer]na/Medicare as of 03/15/18.  Per pt she was given a trelegy patient assistance application at her last office visit with RB.  LL please advise if you have these forms.  Thanks!

## 2018-01-25 NOTE — Telephone Encounter (Signed)
Called [employer] to initiate PA for Trelegy, rep stated that pt's medicaid coverage is no longer active as of the end of April 2019.  Per rep, patient will need to contact her case worker to follow up on why her coverage was not continued.  lmtcb X1 for pt to make aware of the above.

## 2018-01-25 NOTE — Telephone Encounter (Signed)
Called [employer] to ensure that forms were being processed- rep states that it typically takes 1-2 weeks for the faxed applications to be processed in the system.  Rep could not locate a form for this year, but advised to call back in a couple of days to see if this has been uploaded.  Will hold message to follow up on.

## 2018-01-26 NOTE — Telephone Encounter (Signed)
[facility] Fax: [phone]

## 2018-01-26 NOTE — Telephone Encounter (Signed)
I called [facility] and they stated they did not receive the renewal application. Ria Comment do you still have the papers so I can re-fax?

## 2018-01-26 NOTE — Telephone Encounter (Signed)
[facility] called to check status of paperwork, let her know they were still being processed, stated she would follow up at a later date, nothing other needed

## 2018-01-27 ENCOUNTER — Ambulatory Visit (INDEPENDENT_AMBULATORY_CARE_PROVIDER_SITE_OTHER): Payer: Medicaid Other | Admitting: Internal Medicine

## 2018-01-28 NOTE — Telephone Encounter (Signed)
These forms were sent out to be scanned.

## 2018-01-29 NOTE — Telephone Encounter (Signed)
Scanning can take weeks so I printed another application and will call pt to let her know. She will have to come back by and sign forms. I will place forms in RB box until we hear from patient.   ATC pt, no answer. Left message for pt to call back.

## 2018-02-01 NOTE — Telephone Encounter (Signed)
lmtcb for pt X2

## 2018-02-02 NOTE — Telephone Encounter (Signed)
Patient returned call, CB is [phone]

## 2018-02-02 NOTE — Telephone Encounter (Signed)
Attempted to call patient, no answer, message left to call back.  

## 2018-02-03 NOTE — Telephone Encounter (Signed)
Forms have been signed by Dr. Lamonte Sakai. They will be faxed in.

## 2018-02-22 ENCOUNTER — Other Ambulatory Visit: Payer: Self-pay | Admitting: *Deleted

## 2018-02-22 MED ORDER — FLUTICASONE-UMECLIDIN-VILANT 100-62.5-25 MCG/INH IN AEPB: 1.0000 | INHALATION_SPRAY | Freq: Every day | RESPIRATORY_TRACT | 3 refills | Status: DC

## 2018-03-01 ENCOUNTER — Telehealth: Payer: Self-pay | Admitting: Emergency Medicine

## 2018-03-01 ENCOUNTER — Other Ambulatory Visit: Payer: Self-pay | Admitting: *Deleted

## 2018-03-01 MED ORDER — FLUTICASONE-UMECLIDIN-VILANT 100-62.5-25 MCG/INH IN AEPB: 1.0000 | INHALATION_SPRAY | Freq: Every day | RESPIRATORY_TRACT | 0 refills | Status: DC

## 2018-03-01 NOTE — Telephone Encounter (Signed)
Called and spoke with pt stating that we had a sample of Trelegy I was placing up front for her to come pick up.  Also stated to pt once we receive the fax from her pharmacy regarding the PA, we would get it taken care of for her.  Pt expressed understanding. Sample placed up front. Nothing further needed.

## 2018-03-01 NOTE — Addendum Note (Signed)
Addended by: Lorretta Harp on: 03/01/2018 02:14 PM   Modules accepted: Orders

## 2018-03-15 ENCOUNTER — Telehealth: Payer: Self-pay | Admitting: Emergency Medicine

## 2018-03-15 MED ORDER — FLUTICASONE-UMECLIDIN-VILANT 100-62.5-25 MCG/INH IN AEPB: 1.0000 | INHALATION_SPRAY | Freq: Every day | RESPIRATORY_TRACT | 5 refills | Status: DC

## 2018-03-15 NOTE — Telephone Encounter (Signed)
lmtcb x1 for pt. 

## 2018-03-15 NOTE — Telephone Encounter (Signed)
Patient returning call, [phone]

## 2018-03-15 NOTE — Telephone Encounter (Signed)
Spoke with patient. She stated that with her new insurance, Trelegy will cost her $142 each month. Explained to patient that I could either mail her a coupon for the Trelegy or attempt a tier exception. She wishes to have the coupon. Verified address over the phone. Will place coupon in the mail for her.   She did state that she [employer] and her ID# is [id number].

## 2018-04-06 ENCOUNTER — Other Ambulatory Visit: Payer: Self-pay | Admitting: Neurosurgery

## 2018-05-14 ENCOUNTER — Telehealth: Payer: Self-pay | Admitting: Emergency Medicine

## 2018-05-14 MED ORDER — PREDNISONE 10 MG PO TABS: ORAL_TABLET | ORAL | 0 refills | Status: DC

## 2018-05-14 NOTE — Telephone Encounter (Signed)
Called spoke with patient who c/o increased shortness of breath, prod cough with light yellow to clear mucus, tightness in chest and wheezing x2 days.  Patient feels these symptoms are weather related with the temperature changes.  Pt states that she does NOT "feel sick."  Denies any hemoptysis, f/c/s, chest pain, or PND  Patient is taking her Trelegy daily and Mucinex.  She is requesting prednisone taper (preferably a 12 day taper) [place] Allergies  Allergen Reactions  . Levaquin [Levofloxacin]     hives  . Morphine And Related Itching and Nausea And Vomiting     Dr Lamonte Sakai please advise, thank you.

## 2018-05-14 NOTE — Telephone Encounter (Signed)
Spoke with patient, made aware of recommendations per MD Byrum, confirmed pharmacy. Order for prednisone sent to pharmacy. Voiced understanding. Nothing further needed at this time.

## 2018-05-14 NOTE — Telephone Encounter (Signed)
Symptoms also related to her smoking.  She needs to be reminded that she needs to cut down ultimately stop.  Prednisone: Take 40mg  daily for 3 days, then 30mg  daily for 3 days, then 20mg  daily for 3 days, then 10mg  daily for 3 days, then stop

## 2018-05-14 NOTE — Telephone Encounter (Signed)
Left message for patient to call back  

## 2018-05-19 ENCOUNTER — Telehealth: Payer: Self-pay | Admitting: Emergency Medicine

## 2018-05-19 ENCOUNTER — Ambulatory Visit: Payer: Self-pay | Admitting: Emergency Medicine

## 2018-05-19 NOTE — Telephone Encounter (Signed)
Spoke with patient. She is requesting Dr. Agustina Caroli advise on her having a spinal fusion. 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 Surgery is currently scheduled for 05/31/18 with Dr. Deri Fuelling. She has an appointment with you for 05/25/18. She is concerned how this surgery will play in a role in her COPD.   Dr. Lamonte Sakai, please advise. Thanks!

## 2018-05-19 NOTE — Telephone Encounter (Signed)
Left message for patient to call back  

## 2018-05-19 NOTE — Telephone Encounter (Signed)
Pt is calling back [phone] 

## 2018-05-20 NOTE — Telephone Encounter (Signed)
Called and spoke to the patient. Relayed Dr. Agustina Caroli message. Patient verbalized understanding and will talk with RB at next [place] in further detail. Patient stated she is concerned but doesn't want to delay her surgery. Nothing further needed at this time.

## 2018-05-20 NOTE — Telephone Encounter (Signed)
Her COPD puts her at higher risk for complications from surgery or general anesthesia. It might mean a longer hospitalization or ICU care. We can address further when I see her 9/10

## 2018-05-21 NOTE — Pre-Procedure Instructions (Signed)
CALLI BASHOR  05/21/2018      [facility] [place] [facility] [address], [place] [address] [address] Phone: [phone] Fax: [place], [address] [place] [address] Phone: [phone] Fax: [phone]    Your procedure is scheduled on Monday, Sept. 16th   Report to [facility] Admitting at 6:00 AM             (posted surgery time 8a - 11:41a)   Call this number if you have problems the morning of surgery:  [phone]   Remember:   Do not eat any foods or drink any liquids after midnight, Sunday.   4-5 days prior to surgery, STOP TAKING any Vitamins, Herbal Supplements, Anti-inflammatories, Blood Thinners    Take these medicines the morning of surgery with A SIP OF WATER : Xanax, Gabapentin, Levothyroxine, Omeprazole, Oxycodone.  Please use your inhalers / nebulizer treatments that morning.    Do not wear jewelry, make-up or nail polish.  Do not wear lotions, powders, perfumes, or deodorant.  Do not shave 48 hours prior to surgery. .  Do not bring valuables to the hospital.  [facility] is not responsible for any belongings or valuables.  Contacts, dentures or bridgework may not be worn into surgery.  Leave your suitcase in the car.  After surgery it may be brought to your room.  For patients admitted to the hospital, discharge time will be determined by your treatment team.  Please read over the following fact sheets that you were given. MRSA Information and Surgical Site Infection Prevention       - Preparing For Surgery  Before surgery, you can play an important role. Because skin is not sterile, your skin needs to be as free of germs as possible. You can reduce the number of germs on your skin by washing with CHG (chlorahexidine gluconate) Soap before surgery.  CHG is an antiseptic cleaner which kills germs and bonds with the skin to  continue killing germs even after washing.    Oral Hygiene is also important to reduce your risk of infection.    Remember - BRUSH YOUR TEETH THE MORNING OF SURGERY WITH YOUR REGULAR TOOTHPASTE  Please do not use if you have an allergy to CHG or antibacterial soaps. If your skin becomes reddened/irritated stop using the CHG.  Do not shave (including legs and underarms) for at least 48 hours prior to first CHG shower. It is OK to shave your face.  Please follow these instructions carefully.   1. Shower the NIGHT BEFORE SURGERY and the MORNING OF SURGERY with CHG.   2. If you chose to wash your hair, wash your hair first as usual with your normal shampoo.  3. After you shampoo, rinse your hair and body thoroughly to remove the shampoo.  4. Use CHG as you would any other liquid soap. You can apply CHG directly to the skin and wash gently with a scrungie or a clean washcloth.   5. Apply the CHG Soap to your body ONLY FROM THE NECK DOWN.  Do not use on open wounds or open sores. Avoid contact with your eyes, ears, mouth and genitals (private parts). Wash Face and genitals (private parts)  with your normal soap.  6. Wash thoroughly, paying special attention to the area where your surgery will be performed.  7. Thoroughly rinse your body with warm  water from the neck down.  8. DO NOT shower/wash with your normal soap after using and rinsing off the CHG Soap.  9. Pat yourself dry with a CLEAN TOWEL.  10. Wear CLEAN PAJAMAS to bed the night before surgery, wear comfortable clothes the morning of surgery  11. Place CLEAN SHEETS on your bed the night of your first shower and DO NOT SLEEP WITH PETS.  Day of Surgery:  Do not apply any deodorants/lotions.  Please wear clean clothes to the hospital/surgery center.   Remember to brush your teeth WITH YOUR REGULAR TOOTHPASTE.

## 2018-05-24 ENCOUNTER — Other Ambulatory Visit: Payer: Self-pay

## 2018-05-24 ENCOUNTER — Encounter (HOSPITAL_COMMUNITY)
Admission: RE | Admit: 2018-05-24 | Discharge: 2018-05-24 | Disposition: A | Discharge status: Home / Self Care | Payer: Medicare HMO | Source: Ambulatory Visit | Attending: Neurosurgery | Admitting: Neurosurgery

## 2018-05-24 ENCOUNTER — Encounter (HOSPITAL_COMMUNITY): Payer: Self-pay

## 2018-05-24 ENCOUNTER — Ambulatory Visit (HOSPITAL_COMMUNITY)
Admission: RE | Admit: 2018-05-24 | Discharge: 2018-05-24 | Disposition: A | Discharge status: Home / Self Care | Payer: Medicare HMO | Source: Ambulatory Visit | Attending: Anesthesiology | Admitting: Anesthesiology

## 2018-05-24 DIAGNOSIS — Z01818 Encounter for other preprocedural examination: Secondary | ICD-10-CM | POA: Insufficient documentation

## 2018-05-24 HISTORY — DX: Unspecified osteoarthritis, unspecified site: M19.90

## 2018-05-24 HISTORY — DX: Gastro-esophageal reflux disease without esophagitis: K21.9

## 2018-05-24 HISTORY — DX: Headache, unspecified: R51.9

## 2018-05-24 HISTORY — DX: Headache: R51

## 2018-05-24 HISTORY — DX: Fibromyalgia: M79.7

## 2018-05-24 HISTORY — DX: Anxiety disorder, unspecified: F41.9

## 2018-05-24 HISTORY — DX: Dyspnea, unspecified: R06.00

## 2018-05-24 HISTORY — DX: Tinnitus, right ear: H93.11

## 2018-05-24 LAB — CBC WITH DIFFERENTIAL/PLATELET
Abs Immature Granulocytes: 0.1 10*3/uL (ref 0.0–0.1)
Basophils Absolute: 0.1 10*3/uL (ref 0.0–0.1)
Basophils Relative: 0 %
EOS ABS: 0.1 10*3/uL (ref 0.0–0.7)
EOS PCT: 0 %
HCT: 50.8 % — ABNORMAL HIGH (ref 36.0–46.0)
Hemoglobin: 16.5 g/dL — ABNORMAL HIGH (ref 12.0–15.0)
Immature Granulocytes: 1 %
LYMPHS PCT: 31 %
Lymphs Abs: 3.7 10*3/uL (ref 0.7–4.0)
MCH: 34 pg (ref 26.0–34.0)
MCHC: 32.5 g/dL (ref 30.0–36.0)
MCV: 104.5 fL — ABNORMAL HIGH (ref 78.0–100.0)
MONO ABS: 1 10*3/uL (ref 0.1–1.0)
MONOS PCT: 9 %
Neutro Abs: 7.1 10*3/uL (ref 1.7–7.7)
Neutrophils Relative %: 59 %
PLATELETS: 322 10*3/uL (ref 150–400)
RBC: 4.86 MIL/uL (ref 3.87–5.11)
RDW: 13 % (ref 11.5–15.5)
WBC: 12 10*3/uL — ABNORMAL HIGH (ref 4.0–10.5)

## 2018-05-24 LAB — TYPE AND SCREEN
ABO/RH(D): A POS
Antibody Screen: NEGATIVE

## 2018-05-24 LAB — SURGICAL PCR SCREEN
MRSA, PCR: NEGATIVE
Staphylococcus aureus: NEGATIVE

## 2018-05-24 LAB — ABO/RH: ABO/RH(D): A POS

## 2018-05-24 NOTE — Progress Notes (Addendum)
PCP is Dr. Octavio Graves  LOV 03/2018 Pulm is Dr. Lamonte Sakai  [place] 05/25/2018 She was recently on pred pak for a copd flareup.  She states she's been coughing "up some stuff, usually harder in the mornings" Suppose to go see Dr. Lamonte Sakai tomorrow. CXR done here at [facility] Denies murmur, cp, no cardiac testing done Has been using CBD oil in vaping.

## 2018-05-25 ENCOUNTER — Ambulatory Visit: Payer: Medicare HMO | Admitting: Emergency Medicine

## 2018-05-25 ENCOUNTER — Encounter: Payer: Self-pay | Admitting: Emergency Medicine

## 2018-05-25 DIAGNOSIS — J438 Other emphysema: Secondary | ICD-10-CM | POA: Diagnosis not present

## 2018-05-25 MED ORDER — AZITHROMYCIN 250 MG PO TABS: 250.0000 mg | ORAL_TABLET | ORAL | 0 refills | Status: DC

## 2018-05-25 NOTE — Progress Notes (Signed)
Subjective:    Patient ID: Gloria Calderon, female    DOB: March 29, 1966, 52 y.o.   MRN: [id number]  COPD  She complains of cough, shortness of breath and wheezing. Associated symptoms include postnasal drip. Pertinent negatives include no appetite change, ear pain, fever, headaches, rhinorrhea, sneezing, sore throat or trouble swallowing. Her past medical history is significant for COPD.   52 yo woman, smoker 60 pk-yrs, hx of allergies. She is referred for COPD after a recent trip to ED. She averages an episode of bronchitis annually, was started on abx. She continued to cough and have trouble prompting trip to the [facility] ED on 10/03/13. She finished abx and prednisone. Her breathing has improved. Continues to have aches, fever, HA, now with N/V.     ROV 02/05/15 -- history of active smoking, associated COPD that has been difficult to control with frequent episodes of bronchitis and exacerbations. FEV1 1.48 L (53% pred) in 4/'15.  She continues to smoke, has had ups / downs. She had a flare in April for which she received clarithro + cough syrup from PCP. Course c/b suspected thrush for which we treated.  Since then tongue rough and throat sore, raw, hoarse voice. Some congestion.   ROV 12/21/17 --patient follows up today for history of tobacco use (62 pack years, currently smoking), COPD with an FEV1 of 1.48 L (53% predicted) in 12/2013.  She had been managed on Symbicort and Spiriva, stopped the symbicort 2 months ago. She did a trial of Bevespi, doesn't believe it is as helpful as Spiriva.  She has frequent exacerbations, was admitted most recently 07/2017, seen in the emergency department 12/06/17. She was started on O2 2.5L/min at all times. She is on medicaid right now, some concern as to how to qualify to keep it. Her last pred was 2-3 weeks ago, requires frequently. She has exertional dyspnea even on her o2.  She has cut her cigarettes down to 1/2 a pk a day.   ROV 01/12/18 --52 year old smoker with a  history of COPD and severe obstruction documented on prior pulmonary function testing.  I saw her earlier this month and we undertook a trial of Trelegy to see if she would benefit (as a substitute for bevespi).  We also worked on changing her to a Therapist, nutritional - she has it now, set on 3L/min pulsed. She feels better on the Trelegy, breathing better, helps her clear secretions better. She is using Duoneb about once a day now. She is doing fairly well currently. Notes that she is about to lose medicaid. She has cut her cigarettes down to 10/dayu, also using vapor. She is interested in getting her xanax increased (stopping smoking has increased her anxiety). She is planning to discuss with Dr Melina Copa in the near future when she returns from vacation.   ROV 05/25/18 --patient follows up today for severe COPD, associated hypoxemic respiratory failure, tobacco use and anxiety.  I changed her over to Trelegy last time and she benefited, less dyspnea and cough. Unfortunately she hsa been dealing w increased SOB and cough for 10 days. She has taken prednisone taper, finished 2 days ago. She is better but not back to her usual baseline. She is still having some chest tightness. She is smoking 1 pk a week. She is supposed to have spinal fusion on 9/16. No real head congestion.    Review of Systems  Constitutional: Negative for appetite change, fever and unexpected weight change.  HENT: Positive for congestion and  postnasal drip. Negative for dental problem, ear pain, nosebleeds, rhinorrhea, sinus pressure, sneezing, sore throat and trouble swallowing.   Eyes: Negative for redness and itching.  Respiratory: Positive for cough, chest tightness, shortness of breath and wheezing.   Cardiovascular: Negative for palpitations and leg swelling.  Gastrointestinal: Negative for abdominal pain, nausea and vomiting.  Genitourinary: Negative for dysuria.  Musculoskeletal: Negative for joint swelling.  Skin:  Negative for rash.  Neurological: Negative for headaches.  Hematological: Does not bruise/bleed easily.  Psychiatric/Behavioral: Negative for dysphoric mood. The patient is not nervous/anxious.        Objective:   Physical Exam Vitals:   05/25/18 1550  BP: 130/70  Pulse: (!) 104  SpO2: 95%  Weight: 144 lb 3.2 oz (65.4 kg)  Height: 5' 2.5" (1.588 m)   Gen: Pleasant, well-nourished, in no distress, ill appearing  ENT: No lesions,  mouth clear,  oropharynx clear, no postnasal drip, some hoarse voice  Neck: No JVD, no stridor  Lungs: No use of accessory muscles, no wheeze, distant  Cardiovascular: RRR, heart sounds normal, no murmur or gallops, no peripheral edema  Musculoskeletal: No deformities, no cyanosis or clubbing  Neuro: alert, non focal  Skin: Warm, no lesions or rashes      Assessment & Plan:  COPD (chronic obstructive pulmonary disease) ([place]) She was treated for exacerbation about 2 weeks ago.  She is better but not back to baseline.  I am concerned that she may need to postpone her spinal fusion.  I will treat her with azithromycin this week to see if we can get her back to baseline.  If so then she can proceed.  She will call me to let me know if she has improved.  If not then we will postpone the surgery.  I congratulated her smoking cessation efforts.  I do not want her to stop altogether until after the surgery is done.  Please continue Trelegy once daily as you have been taking it.  Remember to rinse and gargle after using this medication. Keep albuterol available to use 2 puffs up to every 4 hours if needed for shortness of breath, chest tightness, wheezing. Take azithromycin as directed until completely gone. Congratulations on decreasing your smoking.  You need to keep working on cutting down.  Our ultimate goal will be to stop altogether.  Do not stop before your planned surgery.  We will discuss setting a quit date after you have recovered from the  operation. Call our office 05/28/2018 to let us know how your breathing is doing.  If you still are not back to usual baseline then we will probably need to postpone your spinal fusion. Follow with Dr Lamonte Sakai in 1 month  Baltazar Apo, MD, PhD 05/25/2018, 4:16 PM [facility] [phone] or if no answer [phone]

## 2018-05-25 NOTE — Assessment & Plan Note (Signed)
She was treated for exacerbation about 2 weeks ago.  She is better but not back to baseline.  I am concerned that she may need to postpone her spinal fusion.  I will treat her with azithromycin this week to see if we can get her back to baseline.  If so then she can proceed.  She will call me to let me know if she has improved.  If not then we will postpone the surgery.  I congratulated her smoking cessation efforts.  I do not want her to stop altogether until after the surgery is done.  Please continue Trelegy once daily as you have been taking it.  Remember to rinse and gargle after using this medication. Keep albuterol available to use 2 puffs up to every 4 hours if needed for shortness of breath, chest tightness, wheezing. Take azithromycin as directed until completely gone. Congratulations on decreasing your smoking.  You need to keep working on cutting down.  Our ultimate goal will be to stop altogether.  Do not stop before your planned surgery.  We will discuss setting a quit date after you have recovered from the operation. Call our office 05/28/2018 to let us know how your breathing is doing.  If you still are not back to usual baseline then we will probably need to postpone your spinal fusion. Follow with Dr Lamonte Sakai in 1 month

## 2018-05-25 NOTE — Patient Instructions (Signed)
Please continue Trelegy once daily as you have been taking it.  Remember to rinse and gargle after using this medication. Keep albuterol available to use 2 puffs up to every 4 hours if needed for shortness of breath, chest tightness, wheezing. Take azithromycin as directed until completely gone. Congratulations on decreasing your smoking.  You need to keep working on cutting down.  Our ultimate goal will be to stop altogether.  Do not stop before your planned surgery.  We will discuss setting a quit date after you have recovered from the operation. Call our office 05/28/2018 to let us know how your breathing is doing.  If you still are not back to usual baseline then we will probably need to postpone your spinal fusion. Follow with Dr Lamonte Sakai in 1 month

## 2018-05-28 ENCOUNTER — Telehealth: Payer: Self-pay | Admitting: Emergency Medicine

## 2018-05-28 NOTE — Telephone Encounter (Signed)
Spoke with patient. She was seen by RB on 9/10 and was prescribed a zpak. She is not feeling any better as of today. She still has a stuffy head and nose and now both ears are ringing. She is currently scheduled for surgery on 9/16 and is concerned her body isn't well enough for it.   She wants to speak directly with RB in regards to the surgery. She stated that she can be reached at [phone].   Dr. Lamonte Sakai, please advise. Thanks!

## 2018-05-28 NOTE — Telephone Encounter (Signed)
I spoke with the patient, she continues to have upper respiratory symptoms, more mucus, hoarse voice, some associated cough and dyspnea.  No overt wheezing.  She has 1 more day of azithromycin left.  I asked her to finish this, try using over-the-counter medication such as Tylenol Cold and flu for symptom management.  Most importantly she was concerned about whether she should proceed with her lumbar fusion with Dr. Trenton Gammon on 9/16.  I think that the wisest approach would be to postpone.  I will contact Dr. Irven [place] office to let him know what is transpired.  Office number is [phone].  Addendum-I was able to contact Dr. Irven [place] office, left a message he is going to call me to discuss.

## 2018-05-31 NOTE — Telephone Encounter (Signed)
I didn't hear from Dr Trenton Gammon, but did pass on the message last week. I presume her surgery was delayed.

## 2018-06-11 ENCOUNTER — Encounter (HOSPITAL_COMMUNITY): Payer: Self-pay | Admitting: *Deleted

## 2018-06-11 NOTE — Progress Notes (Addendum)
Gloria Calderon states that she stopped taking BC powders, has not stopped using E-cigs with CBD oil or her vitamins or herbal products.  I instructed patient to stop all today. I also instructed patient no smoking within 24 hours prior to OR. I instructed patient to stop Allegra D.

## 2018-06-13 ENCOUNTER — Encounter (HOSPITAL_COMMUNITY): Payer: Self-pay | Admitting: Anesthesiology

## 2018-06-14 ENCOUNTER — Encounter (HOSPITAL_COMMUNITY)
Admission: RE | Disposition: A | Discharge status: Home / Self Care | Payer: Self-pay | Source: Ambulatory Visit | Attending: Neurosurgery

## 2018-06-14 ENCOUNTER — Other Ambulatory Visit: Payer: Self-pay

## 2018-06-14 ENCOUNTER — Encounter: Payer: Self-pay | Admitting: Cardiology

## 2018-06-14 ENCOUNTER — Encounter (HOSPITAL_COMMUNITY): Payer: Self-pay | Admitting: *Deleted

## 2018-06-14 ENCOUNTER — Ambulatory Visit (HOSPITAL_COMMUNITY)
Admission: RE | Admit: 2018-06-14 | Discharge: 2018-06-14 | Disposition: A | Discharge status: Home / Self Care | Payer: Medicare HMO | Source: Ambulatory Visit | Attending: Neurosurgery | Admitting: Neurosurgery

## 2018-06-14 DIAGNOSIS — F17200 Nicotine dependence, unspecified, uncomplicated: Secondary | ICD-10-CM | POA: Diagnosis not present

## 2018-06-14 DIAGNOSIS — M4316 Spondylolisthesis, lumbar region: Secondary | ICD-10-CM | POA: Diagnosis not present

## 2018-06-14 DIAGNOSIS — K219 Gastro-esophageal reflux disease without esophagitis: Secondary | ICD-10-CM | POA: Insufficient documentation

## 2018-06-14 DIAGNOSIS — E039 Hypothyroidism, unspecified: Secondary | ICD-10-CM | POA: Insufficient documentation

## 2018-06-14 DIAGNOSIS — R0789 Other chest pain: Secondary | ICD-10-CM | POA: Insufficient documentation

## 2018-06-14 DIAGNOSIS — E559 Vitamin D deficiency, unspecified: Secondary | ICD-10-CM | POA: Insufficient documentation

## 2018-06-14 DIAGNOSIS — J449 Chronic obstructive pulmonary disease, unspecified: Secondary | ICD-10-CM | POA: Insufficient documentation

## 2018-06-14 DIAGNOSIS — M549 Dorsalgia, unspecified: Secondary | ICD-10-CM | POA: Insufficient documentation

## 2018-06-14 DIAGNOSIS — Z5309 Procedure and treatment not carried out because of other contraindication: Secondary | ICD-10-CM | POA: Diagnosis not present

## 2018-06-14 DIAGNOSIS — M5136 Other intervertebral disc degeneration, lumbar region: Secondary | ICD-10-CM | POA: Insufficient documentation

## 2018-06-14 HISTORY — DX: Allergy status to unspecified drugs, medicaments and biological substances: Z88.9

## 2018-06-14 LAB — CBC
HCT: 50.1 % — ABNORMAL HIGH (ref 36.0–46.0)
Hemoglobin: 16.3 g/dL — ABNORMAL HIGH (ref 12.0–15.0)
MCH: 33.7 pg (ref 26.0–34.0)
MCHC: 32.5 g/dL (ref 30.0–36.0)
MCV: 103.5 fL — ABNORMAL HIGH (ref 78.0–100.0)
Platelets: 314 10*3/uL (ref 150–400)
RBC: 4.84 MIL/uL (ref 3.87–5.11)
RDW: 12.8 % (ref 11.5–15.5)
WBC: 8.6 10*3/uL (ref 4.0–10.5)

## 2018-06-14 LAB — TYPE AND SCREEN
ABO/RH(D): A POS
ANTIBODY SCREEN: NEGATIVE

## 2018-06-14 LAB — BASIC METABOLIC PANEL
Anion gap: 7 (ref 5–15)
BUN: 7 mg/dL (ref 6–20)
CO2: 26 mmol/L (ref 22–32)
Calcium: 8.7 mg/dL — ABNORMAL LOW (ref 8.9–10.3)
Chloride: 105 mmol/L (ref 98–111)
Creatinine, Ser: 0.78 mg/dL (ref 0.44–1.00)
GFR calc Af Amer: >60 mL/min (ref >60)
GFR calc non Af Amer: >60 mL/min (ref >60)
Glucose, Bld: 93 mg/dL (ref 70–99)
POTASSIUM: 3.5 mmol/L (ref 3.5–5.1)
SODIUM: 138 mmol/L (ref 135–145)

## 2018-06-14 SURGERY — POSTERIOR LUMBAR FUSION 2 LEVEL
Anesthesia: General | Site: Back

## 2018-06-14 MED ORDER — CHLORHEXIDINE GLUCONATE CLOTH 2 % EX PADS: 6.0000 | MEDICATED_PAD | Freq: Once | CUTANEOUS | Status: DC

## 2018-06-14 MED ORDER — PROPOFOL 10 MG/ML IV BOLUS
INTRAVENOUS | Status: AC
  Filled 2018-06-14: qty 20

## 2018-06-14 MED ORDER — PHENYLEPHRINE 40 MCG/ML (10ML) SYRINGE FOR IV PUSH (FOR BLOOD PRESSURE SUPPORT)
PREFILLED_SYRINGE | INTRAVENOUS | Status: AC
  Filled 2018-06-14: qty 10

## 2018-06-14 MED ORDER — SUCCINYLCHOLINE CHLORIDE 200 MG/10ML IV SOSY
PREFILLED_SYRINGE | INTRAVENOUS | Status: AC
  Filled 2018-06-14: qty 10

## 2018-06-14 MED ORDER — ONDANSETRON HCL 4 MG/2ML IJ SOLN
INTRAMUSCULAR | Status: AC
  Filled 2018-06-14: qty 2

## 2018-06-14 MED ORDER — MIDAZOLAM HCL 2 MG/2ML IJ SOLN
INTRAMUSCULAR | Status: AC
  Filled 2018-06-14: qty 2

## 2018-06-14 MED ORDER — CEFAZOLIN SODIUM-DEXTROSE 2-4 GM/100ML-% IV SOLN
2.0000 g | INTRAVENOUS | Status: DC
  Filled 2018-06-14: qty 100

## 2018-06-14 MED ORDER — ROCURONIUM BROMIDE 50 MG/5ML IV SOSY
PREFILLED_SYRINGE | INTRAVENOUS | Status: AC
  Filled 2018-06-14: qty 5

## 2018-06-14 MED ORDER — DEXAMETHASONE SODIUM PHOSPHATE 10 MG/ML IJ SOLN
10.0000 mg | INTRAMUSCULAR | Status: DC
  Filled 2018-06-14: qty 1

## 2018-06-14 MED ORDER — LACTATED RINGERS IV SOLN
INTRAVENOUS | Status: DC
  Administered 2018-06-14: 08:00:00 via INTRAVENOUS

## 2018-06-14 MED ORDER — LIDOCAINE 2% (20 MG/ML) 5 ML SYRINGE
INTRAMUSCULAR | Status: AC
  Filled 2018-06-14: qty 5

## 2018-06-14 MED ORDER — FENTANYL CITRATE (PF) 250 MCG/5ML IJ SOLN
INTRAMUSCULAR | Status: AC
  Filled 2018-06-14: qty 5

## 2018-06-14 MED ORDER — EPHEDRINE 5 MG/ML INJ
INTRAVENOUS | Status: AC
  Filled 2018-06-14: qty 10

## 2018-06-14 NOTE — Progress Notes (Signed)
Anesthesia at bedside stating patient's surgery was cancelled at this time due to chest pain. dc'd IV and dc'd patient to home with mother.

## 2018-06-14 NOTE — Progress Notes (Signed)
Pt to follow with Dr. Geraldo Pitter tomorrow at 9:20 Am for  Cardiology eval.

## 2018-06-14 NOTE — Progress Notes (Signed)
Anesthesiology note:  Gloria Calderon is a 52 year old female with COPD and ongoing tabbaco abuse who is scheduled to undergo L4-5, L5-S1 PLIF surgery by Dr. Trenton Gammon today.  She has no previous history of heart disease, hypertension, or diabetes.  However upon arrival to the holding area at [facility] she stated she is had episodes of sharp chest pain which radiates into her right shoulder and into her neck lasting 5 to 10 minutes.  The last episode occurred 3 to 4 days ago.  She has  averaged 1 one episode every 1 to 2 weeks.  EKG today shows sinus rhythm with right to right with deviation.  Impression: Atypical chest pain and 52 year old female with extensive smoking history and COPD but no prior history of heart disease or other risk factors.  Given the fact that her surgery is completely elective, it was decided that we would postpone her surgery pending cardiology evaluation.  An appointment was made with [facility] at [facility] for tomorrow.  She will then be rescheduled for surgery following her cardiology evaluation.  Roberts Gaudy

## 2018-06-15 ENCOUNTER — Telehealth: Payer: Self-pay | Admitting: Emergency Medicine

## 2018-06-15 ENCOUNTER — Encounter: Payer: Self-pay | Admitting: Cardiology

## 2018-06-15 ENCOUNTER — Ambulatory Visit (INDEPENDENT_AMBULATORY_CARE_PROVIDER_SITE_OTHER): Payer: Medicare HMO | Admitting: Cardiology

## 2018-06-15 VITALS — BP 130/82 | HR 99 | Ht 62.5 in | Wt 143.1 lb

## 2018-06-15 DIAGNOSIS — E559 Vitamin D deficiency, unspecified: Secondary | ICD-10-CM | POA: Diagnosis not present

## 2018-06-15 DIAGNOSIS — M5440 Lumbago with sciatica, unspecified side: Secondary | ICD-10-CM

## 2018-06-15 DIAGNOSIS — J431 Panlobular emphysema: Secondary | ICD-10-CM

## 2018-06-15 DIAGNOSIS — I209 Angina pectoris, unspecified: Secondary | ICD-10-CM | POA: Diagnosis not present

## 2018-06-15 DIAGNOSIS — Z01810 Encounter for preprocedural cardiovascular examination: Secondary | ICD-10-CM | POA: Insufficient documentation

## 2018-06-15 DIAGNOSIS — F17210 Nicotine dependence, cigarettes, uncomplicated: Secondary | ICD-10-CM

## 2018-06-15 DIAGNOSIS — K219 Gastro-esophageal reflux disease without esophagitis: Secondary | ICD-10-CM

## 2018-06-15 DIAGNOSIS — M5136 Other intervertebral disc degeneration, lumbar region: Secondary | ICD-10-CM

## 2018-06-15 DIAGNOSIS — Z720 Tobacco use: Secondary | ICD-10-CM

## 2018-06-15 DIAGNOSIS — E039 Hypothyroidism, unspecified: Secondary | ICD-10-CM

## 2018-06-15 DIAGNOSIS — M51369 Other intervertebral disc degeneration, lumbar region without mention of lumbar back pain or lower extremity pain: Secondary | ICD-10-CM

## 2018-06-15 MED ORDER — ASPIRIN EC 81 MG PO TBEC: 81.0000 mg | DELAYED_RELEASE_TABLET | Freq: Every day | ORAL | 3 refills | Status: DC

## 2018-06-15 MED ORDER — NITROGLYCERIN 0.4 MG SL SUBL: 0.4000 mg | SUBLINGUAL_TABLET | SUBLINGUAL | 11 refills | Status: DC | PRN

## 2018-06-15 MED FILL — NITROGLYCERIN 0.4 MG TAB SL: 0.4 | 7 days supply | Qty: 25 | Fill #0

## 2018-06-15 NOTE — Addendum Note (Signed)
Addended by: Mattie Marlin on: 06/15/2018 11:12 AM   Modules accepted: Orders

## 2018-06-15 NOTE — Addendum Note (Signed)
Addended by: Mattie Marlin on: 06/15/2018 10:54 AM   Modules accepted: Orders

## 2018-06-15 NOTE — Progress Notes (Signed)
Cardiology Office Note:    Date:  06/15/2018   ID:  Gloria Calderon, DOB 1966-04-15, MRN [id number]  PCP:  Octavio Graves, DO  Cardiologist:  Jenean Lindau, MD   Referring MD: Octavio Graves, DO    ASSESSMENT:    1. Angina pectoris ([place])   2. DDD (degenerative disc disease), lumbar   3. Vitamin D deficiency   4. Acquired hypothyroidism   5. Gastroesophageal reflux disease, esophagitis presence not specified   6. Low back pain with sciatica, sciatica laterality unspecified, unspecified back pain laterality, unspecified chronicity   7. Pre-operative cardiovascular examination   8. Tobacco abuse   9. Panlobular emphysema ([place])    PLAN:    In order of problems listed above:  1. The patient's symptoms are very concerning.  They are suggestive of angina pectoris in the midst of significant risk factors.  She has been a longtime smoker and also gives history of COPD by her primary care physician. 2. In view of the patient's symptoms, I discussed with the patient options for evaluation. Invasive and noninvasive options were given to the patient. I discussed stress testing and coronary angiography and left heart catheterization at length. Benefits, pros and cons of each approach were discussed at length. Patient had multiple questions which were answered to the patient's satisfaction. Patient opted for invasive evaluation and we will set up for coronary angiography and left heart catheterization. Further recommendations will be made based on the findings with coronary angiography. In the interim if the patient has any significant symptoms in hospital to the nearest emergency room. 3. Sublingual nitroglycerin prescription was sent, its protocol and 911 protocol explained and the patient vocalized understanding questions were answered to the patient's satisfaction. 4. I spent 5 minutes with the patient discussing solely about smoking. Smoking cessation was counseled. I suggested to the  patient also different medications and pharmacological interventions. Patient is keen to try stopping on its own at this time. He will get back to me if he needs any further assistance in this matter.   Medication Adjustments/Labs and Tests Ordered: Current medicines are reviewed at length with the patient today.  Concerns regarding medicines are outlined above.  No orders of the defined types were placed in this encounter.  No orders of the defined types were placed in this encounter.    History of Present Illness:    Gloria Calderon is a 52 y.o. female who is being seen today for the evaluation of preop vascular assessment and angina pectoris at the request of Octavio Graves, DO.  Patient is a pleasant 52 year old female.  She has past medical history of heavy cigarette smoking and family history of coronary artery disease.  She is planning to undergo surgery went for a preop assessment.  She gives a history of chest tightness.  She tells me that she wakes up in the night at times with chest tightness this radiates to the arm into the neck.  It is severe and crushing in nature and it resolved with aspirin.  She has had this for the past couple of months and therefore she is concerned about it and give this history to the preop team and they sent her here for evaluation.  At the time of my evaluation, the patient is alert awake oriented and in no distress.  Past Medical History:  Diagnosis Date  . Anxiety   . Arthritis   . Asthma   . Chronic back pain   . Constipation 04/02/2017  .  COPD (chronic obstructive pulmonary disease) ([place])   . DDD (degenerative disc disease), lumbar   . Dyspnea   . Fibromyalgia   . GERD (gastroesophageal reflux disease)   . H/O seasonal allergies   . Headache   . Hypothyroidism   . Osteoporosis   . Ringing in ear, right   . Sciatica   . Spinal stenosis     Past Surgical History:  Procedure Laterality Date  . APPENDECTOMY    . CHOLECYSTECTOMY       Current Medications: Current Meds  Medication Sig  . acetaminophen (TYLENOL) 500 MG tablet Take 1,000 mg by mouth every 6 (six) hours as needed (for pain.).  Marland Kitchen albuterol (PROVENTIL HFA;VENTOLIN HFA) 108 (90 Base) MCG/ACT inhaler Inhale 2 puffs every 6 (six) hours as needed into the lungs for wheezing or shortness of breath.  . ALPRAZolam (XANAX) 1 MG tablet Take 1 tablet (1 mg total) by mouth 4 (four) times daily as needed for anxiety. (Patient taking differently: Take 1-2 mg by mouth See admin instructions. Take 1 tablet (1 mg) in the afternoon & Take 2 tablets ( 2 mg) by mouth at bedtime.)  . Aspirin-Salicylamide-Caffeine (BC FAST PAIN RELIEF) 650-195-33.3 MG PACK Take 1 packet by mouth daily as needed (for pain).  Marland Kitchen azithromycin (ZITHROMAX) 250 MG tablet Take 1 tablet (250 mg total) by mouth as directed.  . Biotin w/ Vitamins C & E (HAIR/SKIN/NAILS PO) Take 2 tablets by mouth daily.  . bisacodyl (DULCOLAX) 5 MG EC tablet Take 5-10 mg by mouth daily as needed for moderate constipation (for constipation.).  Marland Kitchen Calcium Carb-Cholecalciferol (CALCIUM 600+D3 PO) Take 1 tablet by mouth 2 (two) times daily.  . carboxymethylcellulose (REFRESH PLUS) 0.5 % SOLN Place 1 drop into both eyes 2 (two) times daily as needed (dry eyes.).   Marland Kitchen Cranberry-Vitamin C-Vitamin E (CRANBERRY PLUS VITAMIN C) 4200-20-3 MG-MG-UNIT CAPS Take 2 tablets by mouth daily.  . cyclobenzaprine (FLEXERIL) 10 MG tablet Take 30 mg by mouth at bedtime.  . diphenhydrAMINE (BENADRYL) 25 MG tablet Take 50 mg by mouth at bedtime as needed.  . fexofenadine-pseudoephedrine (ALLEGRA-D 24) 180-240 MG 24 hr tablet Take by mouth daily.  . Fluticasone-Umeclidin-Vilant (TRELEGY ELLIPTA) 100-62.5-25 MCG/INH AEPB Inhale 1 puff into the lungs daily.  Marland Kitchen gabapentin (NEURONTIN) 300 MG capsule Take 300 mg by mouth See admin instructions. Take 1 capsule (300 mg) in the morning, 1 capsule (300 mg) in the afternoon, 1 capsule (300 mg) in the evening, & 2  capsules (600 mg) by mouth at bedtime.  Marland Kitchen ipratropium-albuterol (DUONEB) 0.5-2.5 (3) MG/3ML SOLN Take 3 mLs every 6 (six) hours as needed by nebulization (shortness of breath).  Marland Kitchen levothyroxine (SYNTHROID, LEVOTHROID) 25 MCG tablet Take 25 mcg by mouth daily before breakfast.   . magic mouthwash w/lidocaine SOLN Take 5 mLs by mouth 3 (three) times daily as needed for mouth pain. Swish and spit, do not swallow  . Magnesium 500 MG TABS Take 500 mg by mouth 2 (two) times daily.  . Misc Natural Products (FOCUSED MIND PO) Take 1 capsule by mouth 2 (two) times daily.  Marland Kitchen MUCINEX FAST-MAX DM MAX 20-400 MG/20ML LIQD Take 5-10 mg by mouth at bedtime.  . Multiple Vitamins-Minerals (MULTIVITAMIN & MINERAL PO) Take 1 tablet by mouth daily. Centrum Multivitamin  . nicotine (NICODERM CQ - DOSED IN MG/24 HOURS) 14 mg/24hr patch Place 1 patch (14 mg total) onto the skin daily. (Patient taking differently: Place 14 mg onto the skin 3 (three) times a week. )  .  NON FORMULARY Take 2 tablets by mouth 3 (three) times daily. Beet Root supplement  . omeprazole (PRILOSEC) 40 MG capsule Take 40 mg by mouth daily.  Marland Kitchen OVER THE COUNTER MEDICATION CBD oil 1,500mg   daily  . oxyCODONE (ROXICODONE) 15 MG immediate release tablet Take 15 mg by mouth every 6 (six) hours as needed for pain.  . Probiotic Product (ALIGN) 4 MG CAPS Take 4 mg by mouth at bedtime.  . sodium chloride (OCEAN) 0.65 % SOLN nasal spray Place 1 spray into both nostrils at bedtime.  . Teriparatide, Recombinant, (FORTEO) 600 MCG/2.4ML SOLN Inject 20 mcg into the skin daily.  . Wheat Dextrin (BENEFIBER PO) Take 1 packet by mouth See admin instructions. Take 1 packet 2-3 times daily     Allergies:   Levaquin [levofloxacin] and Morphine and related   Social History   Socioeconomic History  . Marital status: Legally Separated    Spouse name: Not on file  . Number of children: Not on file  . Years of education: Not on file  . Highest education level: Not on  file  Occupational History  . Not on file  Social Needs  . Financial resource strain: Not on file  . Food insecurity:    Worry: Not on file    Inability: Not on file  . Transportation needs:    Medical: Not on file    Non-medical: Not on file  Tobacco Use  . Smoking status: Current Every Day Smoker    Packs/day: 0.50    Years: 30.00    Pack years: 15.00    Types: Cigarettes, E-cigarettes  . Smokeless tobacco: Never Used  Substance and Sexual Activity  . Alcohol use: Yes    Alcohol/week: 4.0 standard drinks    Types: 4 Cans of beer per week    Comment: occasionally  . Drug use: No  . Sexual activity: Not on file  Lifestyle  . Physical activity:    Days per week: Not on file    Minutes per session: Not on file  . Stress: Not on file  Relationships  . Social connections:    Talks on phone: Not on file    Gets together: Not on file    Attends religious service: Not on file    Active member of club or organization: Not on file    Attends meetings of clubs or organizations: Not on file    Relationship status: Not on file  Other Topics Concern  . Not on file  Social History Narrative  . Not on file     Family History: The patient's family history includes COPD in her father; Diabetes Mellitus II in her mother.  ROS:   Please see the history of present illness.    All other systems reviewed and are negative.  EKGs/Labs/Other Studies Reviewed:    The following studies were reviewed today: EKG reveals sinus rhythm and nonspecific ST-T changes.   Recent Labs: 06/14/2018: BUN 7; Creatinine, Ser 0.78; Hemoglobin 16.3; Platelets 314; Potassium 3.5; Sodium 138  Recent Lipid Panel No results found for: CHOL, TRIG, HDL, CHOLHDL, VLDL, LDLCALC, LDLDIRECT  Physical Exam:    VS:  BP 130/82 (BP Location: Left Arm, Patient Position: Sitting, Cuff Size: Normal)   Pulse 99   Ht 5' 2.5" (1.588 m)   Wt 143 lb 1.3 oz (64.9 kg)   SpO2 96%   BMI 25.75 kg/m     Wt Readings  from Last 3 Encounters:  06/15/18 143 lb 1.3 oz (  64.9 kg)  06/14/18 143 lb 4.8 oz (65 kg)  05/25/18 144 lb 3.2 oz (65.4 kg)     GEN: Patient is in no acute distress HEENT: Normal NECK: No JVD; No carotid bruits LYMPHATICS: No lymphadenopathy CARDIAC: S1 S2 regular, 2/6 systolic murmur at the apex. RESPIRATORY:  Clear to auscultation without rales, wheezing or rhonchi  ABDOMEN: Soft, non-tender, non-distended MUSCULOSKELETAL:  No edema; No deformity  SKIN: Warm and dry NEUROLOGIC:  Alert and oriented x 3 PSYCHIATRIC:  Normal affect    Signed, Jenean Lindau, MD  06/15/2018 9:57 AM    [place]

## 2018-06-15 NOTE — Telephone Encounter (Signed)
Will route to RB as a fyi.

## 2018-06-15 NOTE — H&P (View-Only) (Signed)
Cardiology Office Note:    Date:  06/15/2018   ID:  Gloria Calderon, DOB [birth year], MRN [id number]  PCP:  Octavio Graves, DO  Cardiologist:  Jenean Lindau, MD   Referring MD: Octavio Graves, DO    ASSESSMENT:    1. Angina pectoris ([place])   2. DDD (degenerative disc disease), lumbar   3. Vitamin D deficiency   4. Acquired hypothyroidism   5. Gastroesophageal reflux disease, esophagitis presence not specified   6. Low back pain with sciatica, sciatica laterality unspecified, unspecified back pain laterality, unspecified chronicity   7. Pre-operative cardiovascular examination   8. Tobacco abuse   9. Panlobular emphysema ([place])    PLAN:    In order of problems listed above:  1. The patient's symptoms are very concerning.  They are suggestive of angina pectoris in the midst of significant risk factors.  She has been a longtime smoker and also gives history of COPD by her primary care physician. 2. In view of the patient's symptoms, I discussed with the patient options for evaluation. Invasive and noninvasive options were given to the patient. I discussed stress testing and coronary angiography and left heart catheterization at length. Benefits, pros and cons of each approach were discussed at length. Patient had multiple questions which were answered to the patient's satisfaction. Patient opted for invasive evaluation and we will set up for coronary angiography and left heart catheterization. Further recommendations will be made based on the findings with coronary angiography. In the interim if the patient has any significant symptoms in hospital to the nearest emergency room. 3. Sublingual nitroglycerin prescription was sent, its protocol and 911 protocol explained and the patient vocalized understanding questions were answered to the patient's satisfaction. 4. I spent 5 minutes with the patient discussing solely about smoking. Smoking cessation was counseled. I suggested to the  patient also different medications and pharmacological interventions. Patient is keen to try stopping on its own at this time. He will get back to me if he needs any further assistance in this matter.   Medication Adjustments/Labs and Tests Ordered: Current medicines are reviewed at length with the patient today.  Concerns regarding medicines are outlined above.  No orders of the defined types were placed in this encounter.  No orders of the defined types were placed in this encounter.    History of Present Illness:    Gloria Calderon is a 52 y.o. female who is being seen today for the evaluation of preop vascular assessment and angina pectoris at the request of Octavio Graves, DO.  Patient is a pleasant 52 year old female.  She has past medical history of heavy cigarette smoking and family history of coronary artery disease.  She is planning to undergo surgery went for a preop assessment.  She gives a history of chest tightness.  She tells me that she wakes up in the night at times with chest tightness this radiates to the arm into the neck.  It is severe and crushing in nature and it resolved with aspirin.  She has had this for the past couple of months and therefore she is concerned about it and give this history to the preop team and they sent her here for evaluation.  At the time of my evaluation, the patient is alert awake oriented and in no distress.  Past Medical History:  Diagnosis Date  . Anxiety   . Arthritis   . Asthma   . Chronic back pain   . Constipation 04/02/2017  .  COPD (chronic obstructive pulmonary disease) ([place])   . DDD (degenerative disc disease), lumbar   . Dyspnea   . Fibromyalgia   . GERD (gastroesophageal reflux disease)   . H/O seasonal allergies   . Headache   . Hypothyroidism   . Osteoporosis   . Ringing in ear, right   . Sciatica   . Spinal stenosis     Past Surgical History:  Procedure Laterality Date  . APPENDECTOMY    . CHOLECYSTECTOMY       Current Medications: Current Meds  Medication Sig  . acetaminophen (TYLENOL) 500 MG tablet Take 1,000 mg by mouth every 6 (six) hours as needed (for pain.).  Marland Kitchen albuterol (PROVENTIL HFA;VENTOLIN HFA) 108 (90 Base) MCG/ACT inhaler Inhale 2 puffs every 6 (six) hours as needed into the lungs for wheezing or shortness of breath.  . ALPRAZolam (XANAX) 1 MG tablet Take 1 tablet (1 mg total) by mouth 4 (four) times daily as needed for anxiety. (Patient taking differently: Take 1-2 mg by mouth See admin instructions. Take 1 tablet (1 mg) in the afternoon & Take 2 tablets ( 2 mg) by mouth at bedtime.)  . Aspirin-Salicylamide-Caffeine (BC FAST PAIN RELIEF) 650-195-33.3 MG PACK Take 1 packet by mouth daily as needed (for pain).  Marland Kitchen azithromycin (ZITHROMAX) 250 MG tablet Take 1 tablet (250 mg total) by mouth as directed.  . Biotin w/ Vitamins C & E (HAIR/SKIN/NAILS PO) Take 2 tablets by mouth daily.  . bisacodyl (DULCOLAX) 5 MG EC tablet Take 5-10 mg by mouth daily as needed for moderate constipation (for constipation.).  Marland Kitchen Calcium Carb-Cholecalciferol (CALCIUM 600+D3 PO) Take 1 tablet by mouth 2 (two) times daily.  . carboxymethylcellulose (REFRESH PLUS) 0.5 % SOLN Place 1 drop into both eyes 2 (two) times daily as needed (dry eyes.).   Marland Kitchen Cranberry-Vitamin C-Vitamin E (CRANBERRY PLUS VITAMIN C) 4200-20-3 MG-MG-UNIT CAPS Take 2 tablets by mouth daily.  . cyclobenzaprine (FLEXERIL) 10 MG tablet Take 30 mg by mouth at bedtime.  . diphenhydrAMINE (BENADRYL) 25 MG tablet Take 50 mg by mouth at bedtime as needed.  . fexofenadine-pseudoephedrine (ALLEGRA-D 24) 180-240 MG 24 hr tablet Take by mouth daily.  . Fluticasone-Umeclidin-Vilant (TRELEGY ELLIPTA) 100-62.5-25 MCG/INH AEPB Inhale 1 puff into the lungs daily.  Marland Kitchen gabapentin (NEURONTIN) 300 MG capsule Take 300 mg by mouth See admin instructions. Take 1 capsule (300 mg) in the morning, 1 capsule (300 mg) in the afternoon, 1 capsule (300 mg) in the evening, & 2  capsules (600 mg) by mouth at bedtime.  Marland Kitchen ipratropium-albuterol (DUONEB) 0.5-2.5 (3) MG/3ML SOLN Take 3 mLs every 6 (six) hours as needed by nebulization (shortness of breath).  Marland Kitchen levothyroxine (SYNTHROID, LEVOTHROID) 25 MCG tablet Take 25 mcg by mouth daily before breakfast.   . magic mouthwash w/lidocaine SOLN Take 5 mLs by mouth 3 (three) times daily as needed for mouth pain. Swish and spit, do not swallow  . Magnesium 500 MG TABS Take 500 mg by mouth 2 (two) times daily.  . Misc Natural Products (FOCUSED MIND PO) Take 1 capsule by mouth 2 (two) times daily.  Marland Kitchen MUCINEX FAST-MAX DM MAX 20-400 MG/20ML LIQD Take 5-10 mg by mouth at bedtime.  . Multiple Vitamins-Minerals (MULTIVITAMIN & MINERAL PO) Take 1 tablet by mouth daily. Centrum Multivitamin  . nicotine (NICODERM CQ - DOSED IN MG/24 HOURS) 14 mg/24hr patch Place 1 patch (14 mg total) onto the skin daily. (Patient taking differently: Place 14 mg onto the skin 3 (three) times a week. )  .  NON FORMULARY Take 2 tablets by mouth 3 (three) times daily. Beet Root supplement  . omeprazole (PRILOSEC) 40 MG capsule Take 40 mg by mouth daily.  Marland Kitchen OVER THE COUNTER MEDICATION CBD oil 1,500mg   daily  . oxyCODONE (ROXICODONE) 15 MG immediate release tablet Take 15 mg by mouth every 6 (six) hours as needed for pain.  . Probiotic Product (ALIGN) 4 MG CAPS Take 4 mg by mouth at bedtime.  . sodium chloride (OCEAN) 0.65 % SOLN nasal spray Place 1 spray into both nostrils at bedtime.  . Teriparatide, Recombinant, (FORTEO) 600 MCG/2.4ML SOLN Inject 20 mcg into the skin daily.  . Wheat Dextrin (BENEFIBER PO) Take 1 packet by mouth See admin instructions. Take 1 packet 2-3 times daily     Allergies:   Levaquin [levofloxacin] and Morphine and related   Social History   Socioeconomic History  . Marital status: Legally Separated    Spouse name: Not on file  . Number of children: Not on file  . Years of education: Not on file  . Highest education level: Not on  file  Occupational History  . Not on file  Social Needs  . Financial resource strain: Not on file  . Food insecurity:    Worry: Not on file    Inability: Not on file  . Transportation needs:    Medical: Not on file    Non-medical: Not on file  Tobacco Use  . Smoking status: Current Every Day Smoker    Packs/day: 0.50    Years: 30.00    Pack years: 15.00    Types: Cigarettes, E-cigarettes  . Smokeless tobacco: Never Used  Substance and Sexual Activity  . Alcohol use: Yes    Alcohol/week: 4.0 standard drinks    Types: 4 Cans of beer per week    Comment: occasionally  . Drug use: No  . Sexual activity: Not on file  Lifestyle  . Physical activity:    Days per week: Not on file    Minutes per session: Not on file  . Stress: Not on file  Relationships  . Social connections:    Talks on phone: Not on file    Gets together: Not on file    Attends religious service: Not on file    Active member of club or organization: Not on file    Attends meetings of clubs or organizations: Not on file    Relationship status: Not on file  Other Topics Concern  . Not on file  Social History Narrative  . Not on file     Family History: The patient's family history includes COPD in her father; Diabetes Mellitus II in her mother.  ROS:   Please see the history of present illness.    All other systems reviewed and are negative.  EKGs/Labs/Other Studies Reviewed:    The following studies were reviewed today: EKG reveals sinus rhythm and nonspecific ST-T changes.   Recent Labs: 06/14/2018: BUN 7; Creatinine, Ser 0.78; Hemoglobin 16.3; Platelets 314; Potassium 3.5; Sodium 138  Recent Lipid Panel No results found for: CHOL, TRIG, HDL, CHOLHDL, VLDL, LDLCALC, LDLDIRECT  Physical Exam:    VS:  BP 130/82 (BP Location: Left Arm, Patient Position: Sitting, Cuff Size: Normal)   Pulse 99   Ht 5' 2.5" (1.588 m)   Wt 143 lb 1.3 oz (64.9 kg)   SpO2 96%   BMI 25.75 kg/m     Wt Readings  from Last 3 Encounters:  06/15/18 143 lb 1.3 oz (  64.9 kg)  06/14/18 143 lb 4.8 oz (65 kg)  05/25/18 144 lb 3.2 oz (65.4 kg)     GEN: Patient is in no acute distress HEENT: Normal NECK: No JVD; No carotid bruits LYMPHATICS: No lymphadenopathy CARDIAC: S1 S2 regular, 2/6 systolic murmur at the apex. RESPIRATORY:  Clear to auscultation without rales, wheezing or rhonchi  ABDOMEN: Soft, non-tender, non-distended MUSCULOSKELETAL:  No edema; No deformity  SKIN: Warm and dry NEUROLOGIC:  Alert and oriented x 3 PSYCHIATRIC:  Normal affect    Signed, Jenean Lindau, MD  06/15/2018 9:57 AM    [place]

## 2018-06-15 NOTE — Patient Instructions (Signed)
Medication Instructions:  Your physician has recommended you make the following change in your medication:  START Nitroglycerin 0.4 mg sublingual (under your tongue) as needed for chest pain. If experiencing chest pain, stop what you are doing and sit down. Take 1 nitroglycerin and wait 5 minutes. If chest pain continues, take another nitroglycerin and wait 5 minutes. If chest pain does not subside, take 1 more nitroglycerin and dial 911. You make take a total of 3 nitroglycerin in a 15 minute time frame. START 81 mg enteric coated aspirin daily  Labwork: None  Testing/Procedures:    [facility] [address] [place], [place] [address] Dept: [phone] Loc: [phone]  Gloria Calderon  06/15/2018  You are scheduled for a Cardiac Catheterization on Thursday, October 3 with Dr. Glenetta Hew.  1. Please arrive at the [facility] (Main Entrance A) at [facility]: [address] [place], [place] [address] at 5:30 AM (This time is two hours before your procedure to ensure your preparation). Free valet parking service is available.   Special note: Every effort is made to have your procedure done on time. Please understand that emergencies sometimes delay scheduled procedures.  2. Diet: Do not eat solid foods after midnight.  The patient may have clear liquids until 5am upon the day of the procedure.  3. Labs: Not needed.  4. Medication instructions in preparation for your procedure:  On the morning of your procedure, take your Aspirin and any morning medicines NOT listed above.  You may use sips of water.  5. Plan for one night stay--bring personal belongings. 6. Bring a current list of your medications and current insurance cards. 7. You MUST have a responsible person to drive you home. 8. Someone MUST be with you the first 24 hours after you arrive home or your discharge will be  delayed. 9. Please wear clothes that are easy to get on and off and wear slip-on shoes.  Thank you for allowing [place] to care for you!   -- [facility]   Follow-Up: Your physician recommends that you schedule a follow-up appointment in: 1 month  Any Other Special Instructions Will Be Listed Below (If Applicable).     If you need a refill on your cardiac medications before your next appointment, please call your pharmacy.   [place], RN, BSN   Coronary Angiogram With Stent Coronary angiogram with stent placement is a procedure to widen or open a narrow blood vessel of the heart (coronary artery). Arteries may become blocked by cholesterol buildup (plaques) in the lining of the wall. When a coronary artery becomes partially blocked, blood flow to that area decreases. This may lead to chest pain or a heart attack (myocardial infarction). A stent is a small piece of metal that looks like mesh or a spring. Stent placement may be done as treatment for a heart attack or right after a coronary angiogram in which a blocked artery is found. Let your health care provider know about:  Any allergies you have.  All medicines you are taking, including vitamins, herbs, eye drops, creams, and over-the-counter medicines.  Any problems you or family members have had with anesthetic medicines.  Any blood disorders you have.  Any surgeries you have had.  Any medical conditions you have.  Whether you are pregnant or may be pregnant. What are the risks? Generally, this is a  safe procedure. However, problems may occur, including:  Damage to the heart or its blood vessels.  A return of blockage.  Bleeding, infection, or bruising at the insertion site.  A collection of blood under the skin (hematoma) at the insertion site.  A blood clot in another part of the body.  Kidney injury.  Allergic reaction to the dye or contrast that is  used.  Bleeding into the abdomen (retroperitoneal bleeding).  What happens before the procedure? Staying hydrated Follow instructions from your health care provider about hydration, which may include:  Up to 2 hours before the procedure - you may continue to drink clear liquids, such as water, clear fruit juice, black coffee, and plain tea.  Eating and drinking restrictions Follow instructions from your health care provider about eating and drinking, which may include:  8 hours before the procedure - stop eating heavy meals or foods such as meat, fried foods, or fatty foods.  6 hours before the procedure - stop eating light meals or foods, such as toast or cereal.  2 hours before the procedure - stop drinking clear liquids.  Ask your health care provider about:  Changing or stopping your regular medicines. This is especially important if you are taking diabetes medicines or blood thinners.  Taking medicines such as ibuprofen. These medicines can thin your blood. Do not take these medicines before your procedure if your health care provider instructs you not to. Generally, aspirin is recommended before a procedure of passing a small, thin tube (catheter) through a blood vessel and into the heart (cardiac catheterization).  What happens during the procedure?  An IV tube will be inserted into one of your veins.  You will be given one or more of the following: ? A medicine to help you relax (sedative). ? A medicine to numb the area where the catheter will be inserted into an artery (local anesthetic).  To reduce your risk of infection: ? Your health care team will wash or sanitize their hands. ? Your skin will be washed with soap. ? Hair may be removed from the area where the catheter will be inserted.  Using a guide wire, the catheter will be inserted into an artery. The location may be in your groin, in your wrist, or in the fold of your arm (near your elbow).  A type of X-ray  (fluoroscopy) will be used to help guide the catheter to the opening of the arteries in the heart.  A dye will be injected into the catheter, and X-rays will be taken. The dye will help to show where any narrowing or blockages are located in the arteries.  A tiny wire will be guided to the blocked spot, and a balloon will be inflated to make the artery wider.  The stent will be expanded and will crush the plaques into the wall of the vessel. The stent will hold the area open and improve the blood flow. Most stents have a drug coating to reduce the risk of the stent narrowing over time.  The artery may be made wider using a drill, laser, or other tools to remove plaques.  When the blood flow is better, the catheter will be removed. The lining of the artery will grow over the stent, which stays where it was placed. This procedure may vary among health care providers and hospitals. What happens after the procedure?  If the procedure is done through the leg, you will be kept in bed lying flat for about 6  hours. You will be instructed to not bend and not cross your legs.  The insertion site will be checked frequently.  The pulse in your foot or wrist will be checked frequently.  You may have additional blood tests, X-rays, and a test that records the electrical activity of your heart (electrocardiogram, or ECG). This information is not intended to replace advice given to you by your health care provider. Make sure you discuss any questions you have with your health care provider. Document Released: 03/08/2003 Document Revised: 05/01/2016 Document Reviewed: 04/06/2016 Elsevier Interactive Patient Education  2018 [employer].  Aspirin and Your Heart Aspirin is a medicine that affects the way blood clots. Aspirin can be used to help reduce the risk of blood clots, heart attacks, and other heart-related problems. Should I take aspirin? Your health care provider will help you determine whether it  is safe and beneficial for you to take aspirin daily. Taking aspirin daily may be beneficial if you:  Have had a heart attack or chest pain.  Have undergone open heart surgery such as coronary artery bypass surgery (CABG).  Have had coronary angioplasty.  Have experienced a stroke or transient ischemic attack (TIA).  Have peripheral vascular disease (PVD).  Have chronic heart rhythm problems such as atrial fibrillation.  Are there any risks of taking aspirin daily? Daily use of aspirin can increase your risk of side effects. Some of these include:  Bleeding. Bleeding problems can be minor or serious. An example of a minor problem is a cut that does not stop bleeding. An example of a more serious problem is stomach bleeding or bleeding into the brain. Your risk of bleeding is increased if you are also taking non-steroidal anti-inflammatory medicine (NSAIDs).  Increased bruising.  Upset stomach.  An allergic reaction. People who have nasal polyps have an increased risk of developing an aspirin allergy.  What are some guidelines I should follow when taking aspirin?  Take aspirin only as directed by your health care provider. Make sure you understand how much you should take and what form you should take. The two forms of aspirin are: ? Non-enteric-coated. This type of aspirin does not have a coating and is absorbed quickly. Non-enteric-coated aspirin is usually recommended for people with chest pain. This type of aspirin also comes in a chewable form. ? Enteric-coated. This type of aspirin has a special coating that releases the medicine very slowly. Enteric-coated aspirin causes less stomach upset than non-enteric-coated aspirin. This type of aspirin should not be chewed or crushed.  Drink alcohol in moderation. Drinking alcohol increases your risk of bleeding. When should I seek medical care?  You have unusual bleeding or bruising.  You have stomach pain.  You have an allergic  reaction. Symptoms of an allergic reaction include: ? Hives. ? Itchy skin. ? Swelling of the lips, tongue, or face.  You have ringing in your ears. When should I seek immediate medical care?  Your bowel movements are bloody, dark red, or black in color.  You vomit or cough up blood.  You have blood in your urine.  You cough, wheeze, or feel short of breath. If you have any of the following symptoms, this is an emergency. Do not wait to see if the pain will go away. Get medical help at once. Call your local emergency services (911 in the [place] Do not drive yourself to the hospital.  You have severe chest pain, especially if the pain is crushing or pressure-like and spreads to  the arms, back, neck, or jaw.  You have stroke-like symptoms, such as: ? Loss of vision. ? Difficulty talking. ? Numbness or weakness on one side of your body. ? Numbness or weakness in your arm or leg. ? Not thinking clearly or feeling confused.  This information is not intended to replace advice given to you by your health care provider. Make sure you discuss any questions you have with your health care provider. Document Released: 08/14/2008 Document Revised: 01/09/2016 Document Reviewed: 12/07/2013 Elsevier Interactive Patient Education  2018 [employer]. Nitroglycerin sublingual tablets What is this medicine? NITROGLYCERIN (nye troe GLI ser in) is a type of vasodilator. It relaxes blood vessels, increasing the blood and oxygen supply to your heart. This medicine is used to relieve chest pain caused by angina. It is also used to prevent chest pain before activities like climbing stairs, going outdoors in cold weather, or sexual activity. This medicine may be used for other purposes; ask your health care provider or pharmacist if you have questions. COMMON BRAND NAME(S): Nitroquick, Nitrostat, Nitrotab What should I tell my health care provider before I take this medicine? They need to know if you have  any of these conditions: -anemia -head injury, recent stroke, or bleeding in the brain -liver disease -previous heart attack -an unusual or allergic reaction to nitroglycerin, other medicines, foods, dyes, or preservatives -pregnant or trying to get pregnant -breast-feeding How should I use this medicine? Take this medicine by mouth as needed. At the first sign of an angina attack (chest pain or tightness) place one tablet under your tongue. You can also take this medicine 5 to 10 minutes before an event likely to produce chest pain. Follow the directions on the prescription label. Let the tablet dissolve under the tongue. Do not swallow whole. Replace the dose if you accidentally swallow it. It will help if your mouth is not dry. Saliva around the tablet will help it to dissolve more quickly. Do not eat or drink, smoke or chew tobacco while a tablet is dissolving. If you are not better within 5 minutes after taking ONE dose of nitroglycerin, call 9-1-1 immediately to seek emergency medical care. Do not take more than 3 nitroglycerin tablets over 15 minutes. If you take this medicine often to relieve symptoms of angina, your doctor or health care professional may provide you with different instructions to manage your symptoms. If symptoms do not go away after following these instructions, it is important to call 9-1-1 immediately. Do not take more than 3 nitroglycerin tablets over 15 minutes. Talk to your pediatrician regarding the use of this medicine in children. Special care may be needed. Overdosage: If you think you have taken too much of this medicine contact a poison control center or emergency room at once. NOTE: This medicine is only for you. Do not share this medicine with others. What if I miss a dose? This does not apply. This medicine is only used as needed. What may interact with this medicine? Do not take this medicine with any of the following medications: -certain migraine  medicines like ergotamine and dihydroergotamine (DHE) -medicines used to treat erectile dysfunction like sildenafil, tadalafil, and vardenafil -riociguat This medicine may also interact with the following medications: -alteplase -aspirin -heparin -medicines for high blood pressure -medicines for mental depression -other medicines used to treat angina -phenothiazines like chlorpromazine, mesoridazine, prochlorperazine, thioridazine This list may not describe all possible interactions. Give your health care provider a list of all the medicines, herbs, non-prescription  drugs, or dietary supplements you use. Also tell them if you smoke, drink alcohol, or use illegal drugs. Some items may interact with your medicine. What should I watch for while using this medicine? Tell your doctor or health care professional if you feel your medicine is no longer working. Keep this medicine with you at all times. Sit or lie down when you take your medicine to prevent falling if you feel dizzy or faint after using it. Try to remain calm. This will help you to feel better faster. If you feel dizzy, take several deep breaths and lie down with your feet propped up, or bend forward with your head resting between your knees. You may get drowsy or dizzy. Do not drive, use machinery, or do anything that needs mental alertness until you know how this drug affects you. Do not stand or sit up quickly, especially if you are an older patient. This reduces the risk of dizzy or fainting spells. Alcohol can make you more drowsy and dizzy. Avoid alcoholic drinks. Do not treat yourself for coughs, colds, or pain while you are taking this medicine without asking your doctor or health care professional for advice. Some ingredients may increase your blood pressure. What side effects may I notice from receiving this medicine? Side effects that you should report to your doctor or health care professional as soon as possible: -blurred  vision -dry mouth -skin rash -sweating -the feeling of extreme pressure in the head -unusually weak or tired Side effects that usually do not require medical attention (report to your doctor or health care professional if they continue or are bothersome): -flushing of the face or neck -headache -irregular heartbeat, palpitations -nausea, vomiting This list may not describe all possible side effects. Call your doctor for medical advice about side effects. You may report side effects to FDA at 1-800-FDA-1088. Where should I keep my medicine? Keep out of the reach of children. Store at room temperature between 20 and 25 degrees C (68 and 77 degrees F). Store in [employer]. Protect from light and moisture. Keep tightly closed. Throw away any unused medicine after the expiration date. NOTE: This sheet is a summary. It may not cover all possible information. If you have questions about this medicine, talk to your doctor, pharmacist, or health care provider.  2018 Elsevier/Gold Standard (2013-06-30 17:57:36)

## 2018-06-15 NOTE — Telephone Encounter (Signed)
Thank you for letting me know

## 2018-06-17 ENCOUNTER — Encounter (HOSPITAL_COMMUNITY): Payer: Self-pay | Admitting: Cardiology

## 2018-06-17 ENCOUNTER — Ambulatory Visit (HOSPITAL_COMMUNITY)
Admission: RE | Admit: 2018-06-17 | Discharge: 2018-06-17 | Disposition: A | Discharge status: Home / Self Care | Payer: Medicare HMO | Source: Ambulatory Visit | Attending: Cardiology | Admitting: Cardiology

## 2018-06-17 ENCOUNTER — Encounter (HOSPITAL_COMMUNITY)
Admission: RE | Disposition: A | Discharge status: Home / Self Care | Payer: Self-pay | Source: Ambulatory Visit | Attending: Cardiology

## 2018-06-17 DIAGNOSIS — Z7982 Long term (current) use of aspirin: Secondary | ICD-10-CM | POA: Diagnosis not present

## 2018-06-17 DIAGNOSIS — F17210 Nicotine dependence, cigarettes, uncomplicated: Secondary | ICD-10-CM | POA: Diagnosis not present

## 2018-06-17 DIAGNOSIS — J449 Chronic obstructive pulmonary disease, unspecified: Secondary | ICD-10-CM | POA: Insufficient documentation

## 2018-06-17 DIAGNOSIS — Z9049 Acquired absence of other specified parts of digestive tract: Secondary | ICD-10-CM | POA: Insufficient documentation

## 2018-06-17 DIAGNOSIS — M810 Age-related osteoporosis without current pathological fracture: Secondary | ICD-10-CM | POA: Diagnosis not present

## 2018-06-17 DIAGNOSIS — M797 Fibromyalgia: Secondary | ICD-10-CM | POA: Insufficient documentation

## 2018-06-17 DIAGNOSIS — Z885 Allergy status to narcotic agent status: Secondary | ICD-10-CM | POA: Insufficient documentation

## 2018-06-17 DIAGNOSIS — Z9889 Other specified postprocedural states: Secondary | ICD-10-CM | POA: Insufficient documentation

## 2018-06-17 DIAGNOSIS — M5440 Lumbago with sciatica, unspecified side: Secondary | ICD-10-CM

## 2018-06-17 DIAGNOSIS — E559 Vitamin D deficiency, unspecified: Secondary | ICD-10-CM | POA: Insufficient documentation

## 2018-06-17 DIAGNOSIS — H9311 Tinnitus, right ear: Secondary | ICD-10-CM | POA: Insufficient documentation

## 2018-06-17 DIAGNOSIS — I209 Angina pectoris, unspecified: Secondary | ICD-10-CM | POA: Diagnosis present

## 2018-06-17 DIAGNOSIS — Z01810 Encounter for preprocedural cardiovascular examination: Secondary | ICD-10-CM

## 2018-06-17 DIAGNOSIS — J431 Panlobular emphysema: Secondary | ICD-10-CM

## 2018-06-17 DIAGNOSIS — E039 Hypothyroidism, unspecified: Secondary | ICD-10-CM | POA: Insufficient documentation

## 2018-06-17 DIAGNOSIS — Z79890 Hormone replacement therapy (postmenopausal): Secondary | ICD-10-CM | POA: Diagnosis not present

## 2018-06-17 DIAGNOSIS — Z720 Tobacco use: Secondary | ICD-10-CM

## 2018-06-17 DIAGNOSIS — Z79899 Other long term (current) drug therapy: Secondary | ICD-10-CM | POA: Insufficient documentation

## 2018-06-17 DIAGNOSIS — M5136 Other intervertebral disc degeneration, lumbar region: Secondary | ICD-10-CM

## 2018-06-17 DIAGNOSIS — K219 Gastro-esophageal reflux disease without esophagitis: Secondary | ICD-10-CM | POA: Insufficient documentation

## 2018-06-17 DIAGNOSIS — Z881 Allergy status to other antibiotic agents status: Secondary | ICD-10-CM | POA: Insufficient documentation

## 2018-06-17 HISTORY — PX: LEFT HEART CATH AND CORONARY ANGIOGRAPHY: CATH118249

## 2018-06-17 SURGERY — LEFT HEART CATH AND CORONARY ANGIOGRAPHY
Anesthesia: LOCAL

## 2018-06-17 MED ORDER — LIDOCAINE HCL (PF) 1 % IJ SOLN
INTRAMUSCULAR | Status: AC
  Filled 2018-06-17: qty 30

## 2018-06-17 MED ORDER — SODIUM CHLORIDE 0.9% FLUSH: 3.0000 mL | Freq: Two times a day (BID) | INTRAVENOUS | Status: DC

## 2018-06-17 MED ORDER — ISOSORBIDE MONONITRATE ER 30 MG PO TB24: 30.0000 mg | ORAL_TABLET | Freq: Every day | ORAL | 11 refills | Status: DC

## 2018-06-17 MED ORDER — MIDAZOLAM HCL 2 MG/2ML IJ SOLN
INTRAMUSCULAR | Status: DC | PRN
  Administered 2018-06-17: 1 mg via INTRAVENOUS

## 2018-06-17 MED ORDER — HEPARIN SODIUM (PORCINE) 1000 UNIT/ML IJ SOLN
INTRAMUSCULAR | Status: DC | PRN
  Administered 2018-06-17: 4000 [IU] via INTRAVENOUS

## 2018-06-17 MED ORDER — SODIUM CHLORIDE 0.9% FLUSH: 3.0000 mL | INTRAVENOUS | Status: DC | PRN

## 2018-06-17 MED ORDER — SODIUM CHLORIDE 0.9 % WEIGHT BASED INFUSION
3.0000 mL/kg/h | INTRAVENOUS | Status: AC
  Administered 2018-06-17: 3 mL/kg/h via INTRAVENOUS

## 2018-06-17 MED ORDER — HEPARIN (PORCINE) IN NACL 1000-0.9 UT/500ML-% IV SOLN
INTRAVENOUS | Status: AC
  Filled 2018-06-17: qty 1000

## 2018-06-17 MED ORDER — SODIUM CHLORIDE 0.9 % IV SOLN: 250.0000 mL | INTRAVENOUS | Status: DC | PRN

## 2018-06-17 MED ORDER — NITROGLYCERIN 1 MG/10 ML FOR IR/CATH LAB
INTRA_ARTERIAL | Status: DC | PRN
  Administered 2018-06-17: 200 ug via INTRACORONARY

## 2018-06-17 MED ORDER — HEPARIN (PORCINE) IN NACL 1000-0.9 UT/500ML-% IV SOLN
INTRAVENOUS | Status: DC | PRN
  Administered 2018-06-17 (×2): 500 mL

## 2018-06-17 MED ORDER — FENTANYL CITRATE (PF) 100 MCG/2ML IJ SOLN
INTRAMUSCULAR | Status: AC
  Filled 2018-06-17: qty 2

## 2018-06-17 MED ORDER — ONDANSETRON HCL 4 MG/2ML IJ SOLN: 4.0000 mg | Freq: Four times a day (QID) | INTRAMUSCULAR | Status: DC | PRN

## 2018-06-17 MED ORDER — SODIUM CHLORIDE 0.9 % IV SOLN: INTRAVENOUS | Status: DC

## 2018-06-17 MED ORDER — SODIUM CHLORIDE 0.9 % WEIGHT BASED INFUSION
1.0000 mL/kg/h | INTRAVENOUS | Status: DC
  Administered 2018-06-17: 200 mL via INTRAVENOUS

## 2018-06-17 MED ORDER — IOHEXOL 350 MG/ML SOLN
INTRAVENOUS | Status: DC | PRN
  Administered 2018-06-17: 35 mL via INTRAVENOUS

## 2018-06-17 MED ORDER — MIDAZOLAM HCL 2 MG/2ML IJ SOLN
INTRAMUSCULAR | Status: AC
  Filled 2018-06-17: qty 2

## 2018-06-17 MED ORDER — ASPIRIN 81 MG PO CHEW: 81.0000 mg | CHEWABLE_TABLET | ORAL | Status: DC

## 2018-06-17 MED ORDER — NITROGLYCERIN 1 MG/10 ML FOR IR/CATH LAB
INTRA_ARTERIAL | Status: AC
  Filled 2018-06-17: qty 10

## 2018-06-17 MED ORDER — LIDOCAINE HCL (PF) 1 % IJ SOLN
INTRAMUSCULAR | Status: DC | PRN
  Administered 2018-06-17: 2 mL

## 2018-06-17 MED ORDER — VERAPAMIL HCL 2.5 MG/ML IV SOLN
INTRAVENOUS | Status: DC | PRN
  Administered 2018-06-17: 10 mL via INTRA_ARTERIAL

## 2018-06-17 MED ORDER — FENTANYL CITRATE (PF) 100 MCG/2ML IJ SOLN
INTRAMUSCULAR | Status: DC | PRN
  Administered 2018-06-17: 25 ug via INTRAVENOUS

## 2018-06-17 MED ORDER — VERAPAMIL HCL 2.5 MG/ML IV SOLN
INTRAVENOUS | Status: AC
  Filled 2018-06-17: qty 2

## 2018-06-17 MED ORDER — ACETAMINOPHEN 325 MG PO TABS: 650.0000 mg | ORAL_TABLET | ORAL | Status: DC | PRN

## 2018-06-17 SURGICAL SUPPLY — 9 items
CATH OPTITORQUE TIG 4.0 5F (CATHETERS) ×1 IMPLANT
DEVICE RAD COMP TR BAND LRG (VASCULAR PRODUCTS) ×1 IMPLANT
GLIDESHEATH SLEND A-KIT 6F 22G (SHEATH) ×1 IMPLANT
GUIDEWIRE INQWIRE 1.5J.035X260 (WIRE) IMPLANT
INQWIRE 1.5J .035X260CM (WIRE) ×2
KIT HEART LEFT (KITS) ×2 IMPLANT
PACK CARDIAC CATHETERIZATION (CUSTOM PROCEDURE TRAY) ×2 IMPLANT
TRANSDUCER W/STOPCOCK (MISCELLANEOUS) ×2 IMPLANT
TUBING CIL FLEX 10 FLL-RA (TUBING) ×2 IMPLANT

## 2018-06-17 NOTE — Discharge Instructions (Signed)

## 2018-06-17 NOTE — Interval H&P Note (Signed)
History and Physical Interval Note:  06/17/2018 7:19 AM  Gloria Calderon  has presented today for surgery, with the diagnosis of ANGINA (Class III)  The various methods of treatment have been discussed with the patient and family. After consideration of risks, benefits and other options for treatment, the patient has consented to  Procedure(s): LEFT HEART CATH AND CORONARY ANGIOGRAPHY (N/A) as a surgical intervention .  The patient's history has been reviewed, patient examined, no change in status, stable for surgery.  I have reviewed the patient's chart and labs.  Questions were answered to the patient's satisfaction.    Cath Lab Visit (complete for each Cath Lab visit)  Clinical Evaluation Leading to the Procedure:   ACS: No.  Non-ACS:    Anginal Classification: CCS III  Anti-ischemic medical therapy: No Therapy  Non-Invasive Test Results: No non-invasive testing performed  Prior CABG: No previous CABG    Gloria Calderon

## 2018-06-17 NOTE — Research (Signed)
CADFEM Informed Consent   Subject Name: Gloria Calderon  Subject met inclusion and exclusion criteria.  The informed consent form, study requirements and expectations were reviewed with the subject and questions and concerns were addressed prior to the signing of the consent form.  The subject verbalized understanding of the trail requirements.  The subject agreed to participate in the CADFEM trial and signed the informed consent.  The informed consent was obtained prior to performance of any protocol-specific procedures for the subject.  A copy of the signed informed consent was given to the subject and a copy was placed in the subject's medical record.  Hedrick,Kathern Lobosco W 06/17/2018, 0700

## 2018-06-21 ENCOUNTER — Telehealth: Payer: Self-pay | Admitting: Cardiology

## 2018-06-21 NOTE — Telephone Encounter (Signed)
New message:      Pt c/o medication issue:  1. Name of Medication: isosorbide mononitrate (IMDUR) 30 MG 24 hr tablet  2. How are you currently taking this medication (dosage and times per day)?Take 1 tablet (30 mg total) by mouth at bedtime. Take 30 min after Tylenol   3. Are you having a reaction (difficulty breathing--STAT)? No  4. What is your medication issue? Sweating and pressure in her head/fatigue and week. Pt states this lasted for about 7 hrs and her left eye was swollen shut. Pt states she started taking this on 06/18/18 but since then has not taken this medication since the first time.

## 2018-06-21 NOTE — Telephone Encounter (Signed)
Left voicemail requesting that patient call the office regarding imdur.

## 2018-06-21 NOTE — Telephone Encounter (Signed)
Patient had multiple side effects from the imdur such as eye swelling, migraine, and vomiting. Patient does not wish to try another medication at this time. Would like to discuss this further at her next appointment.

## 2018-06-22 NOTE — Progress Notes (Signed)
Anesthesia Chart Review: SAME DAY WORK-UP (as 06/14/18 surgery cancelled)  Case:  [id number] Date/Time:  06/25/18 1232   Procedure:  PLIF - L4-L5 - L5-S1 (N/A Back)   Anesthesia type:  General   Pre-op diagnosis:  Spondylolisthesis   Location:  [facility] OR ROOM 58 / [place] [facility]   Surgeon:  Earnie Larsson, MD      DISCUSSION: Patient is a 52 year old female scheduled for the above procedure. Surgery was initially scheduled for 05/31/18, but was postponed until due to COPD exacerbation and later cancelled on 06/14/18 when she presented for surgery and reported episodes of sharp chest pain that radiated to her neck and right shoulder. Due to her symptoms and CAD risk factors, her surgery was cancelled and she was set up to see cardiologist Dr. Geraldo Pitter for further evaluation. Since that time she underwent cardiac cath showing normal coronaries and EF, although with diffuse spasm responsive to nitrates. She was started on Imdur for vasospasm and cleared for surgery at "low risk." (Of note, she contacted Dr. Geraldo Pitter on 06/21/18 to report sweating, headaches, fatigue since starting Imdur. Her left eye also became swollen, so she stopped the medication due to side effects. He will discuss further at her next visit.)   History includes smoking, COPD, asthma, dyspnea, hypothyroidism, GERD, fibromyalgia.   Further evaluation on the day of surgery. She will need an updated T&S.    PROVIDERS: Octavio Graves, DO is PCP Baltazar Apo, MD is pulmonologist. Last visit 05/25/18 for COPD exacerbation follow-up. She was not yet back to baseline so he recommended postponing her 05/31/18 surgery.  Jyl Heinz, MD is cardiologist.    LABS: She will need a repeat T&S. Most recent labs show: Lab Results  Component Value Date   WBC 8.6 06/14/2018   HGB 16.3 (H) 06/14/2018   HCT 50.1 (H) 06/14/2018   PLT 314 06/14/2018   GLUCOSE 93 06/14/2018   NA 138 06/14/2018   K 3.5 06/14/2018   CL 105 06/14/2018   CREATININE 0.78  06/14/2018   BUN 7 06/14/2018   CO2 26 06/14/2018   PFTs 01/05/14: PRE-BD: FVC 2.97 (85%), FEV1 1.48 (53%). POST-BD: FVC 2.62 (75%), FEV1 1.25 (45%). TLC 5.35 (109%). DLCO unc 11.06 (48%).    IMAGES: CXR 05/24/18: IMPRESSION: No active cardiopulmonary disease.   EKG: 06/14/18: NSR, rightward axis.   CV: Cardiac cath 06/17/18:  The left ventricular systolic function is normal.  LV end diastolic pressure is normal.  The left ventricular ejection fraction is 55-65% by visual estimate.  There is no mitral valve regurgitation.  There is no aortic valve stenosis. - Angiographically normal coronary arteries with mild diffuse spasm, nitrate responsive. - Normal LVEF with mildly elevated LVEDP in setting of normal systolic pressures. - Recommendation: Return to short stay for TR band removal and discharge home today. Given the mild coronary spasm noted, we will start Imdur 30 mg nightly. No contraindication for moving forward with surgery. No indication for antiplatelet therapy at this time.    Past Medical History:  Diagnosis Date  . Anxiety   . Arthritis   . Asthma   . Chronic back pain   . Constipation 04/02/2017  . COPD (chronic obstructive pulmonary disease) ([place])   . DDD (degenerative disc disease), lumbar   . Dyspnea   . Fibromyalgia   . GERD (gastroesophageal reflux disease)   . H/O seasonal allergies   . Headache   . Hypothyroidism   . Osteoporosis   . Ringing in ear, right   .  Sciatica   . Spinal stenosis     Past Surgical History:  Procedure Laterality Date  . APPENDECTOMY    . CHOLECYSTECTOMY    . LEFT HEART CATH AND CORONARY ANGIOGRAPHY N/A 06/17/2018   Procedure: LEFT HEART CATH AND CORONARY ANGIOGRAPHY;  Surgeon: Leonie Man, MD;  Location: [place];  Service: Cardiovascular;  Laterality: N/A;    MEDICATIONS: No current facility-administered medications for this encounter.    Marland Kitchen acetaminophen (TYLENOL) 500 MG tablet  . albuterol  (PROVENTIL HFA;VENTOLIN HFA) 108 (90 Base) MCG/ACT inhaler  . ALPRAZolam (XANAX) 1 MG tablet  . aspirin EC 81 MG tablet  . Aspirin-Salicylamide-Caffeine (BC FAST PAIN RELIEF) 650-195-33.3 MG PACK  . azithromycin (ZITHROMAX) 250 MG tablet  . Biotin w/ Vitamins C & E (HAIR/SKIN/NAILS PO)  . bisacodyl (DULCOLAX) 5 MG EC tablet  . Calcium Carb-Cholecalciferol (CALCIUM 600+D3 PO)  . carboxymethylcellulose (REFRESH PLUS) 0.5 % SOLN  . Cranberry-Vitamin C-Vitamin E (CRANBERRY PLUS VITAMIN C) 4200-20-3 MG-MG-UNIT CAPS  . cyclobenzaprine (FLEXERIL) 10 MG tablet  . diphenhydrAMINE (BENADRYL) 25 MG tablet  . fexofenadine-pseudoephedrine (ALLEGRA-D 24) 180-240 MG 24 hr tablet  . Fluticasone-Umeclidin-Vilant (TRELEGY ELLIPTA) 100-62.5-25 MCG/INH AEPB  . gabapentin (NEURONTIN) 300 MG capsule  . ipratropium-albuterol (DUONEB) 0.5-2.5 (3) MG/3ML SOLN  . isosorbide mononitrate (IMDUR) 30 MG 24 hr tablet  . levothyroxine (SYNTHROID, LEVOTHROID) 25 MCG tablet  . magic mouthwash w/lidocaine SOLN  . Magnesium 500 MG TABS  . Misc Natural Products (FOCUSED MIND PO)  . MUCINEX FAST-MAX DM MAX 20-400 MG/20ML LIQD  . Multiple Vitamins-Minerals (MULTIVITAMIN & MINERAL PO)  . nicotine (NICODERM CQ - DOSED IN MG/24 HOURS) 14 mg/24hr patch  . nitroGLYCERIN (NITROSTAT) 0.4 MG SL tablet  . NON FORMULARY  . omeprazole (PRILOSEC) 40 MG capsule  . OVER THE COUNTER MEDICATION  . oxyCODONE (ROXICODONE) 15 MG immediate release tablet  . Probiotic Product (ALIGN) 4 MG CAPS  . sodium chloride (OCEAN) 0.65 % SOLN nasal spray  . Teriparatide, Recombinant, (FORTEO) 600 MCG/2.4ML SOLN  . Wheat Dextrin (BENEFIBER PO)  She was previously instructed to hold ASA prior to her original surgery date.   George Hugh [facility] Short Stay Center/Anesthesiology Phone [phone] 06/22/2018 5:28 PM

## 2018-06-22 NOTE — Telephone Encounter (Signed)
ok 

## 2018-06-30 ENCOUNTER — Telehealth: Payer: Self-pay | Admitting: Emergency Medicine

## 2018-06-30 ENCOUNTER — Ambulatory Visit: Payer: Medicare HMO | Admitting: Emergency Medicine

## 2018-06-30 NOTE — Telephone Encounter (Signed)
Noted  

## 2018-07-09 NOTE — Pre-Procedure Instructions (Addendum)
Gloria Calderon  07/09/2018    Your procedure is scheduled on Tuesday, July 20, 2018 at 8:00 AM.   Report to [facility] Entrance "A" Admitting Office at 6:00 AM.   Call this number if you have problems the morning of surgery: [phone]               Questions prior to day of surgery, please call [phone] between 8 & 4 PM.   Remember:  Do not eat or drink after midnight Monday, 07/19/18.  Take these medicines the morning of surgery with A SIP OF WATER: Esomeprazole (Nexium), Gabapentin (Neurontin), Levothyroxine (Synthroid), Oxycodone or Tylenol - if needed, Duoneb - if needed, Trelegy Ellipta inhaler, Flonase, Albuterol inhaler - if needed (bring this inhaler with you day of surgery)  Stop Multivitamins 7 days prior to surgery. Stop Aspirin as instructed by surgeon or cardiologist. Do not use NSAIDS (Ibuprofen, Aleve, etc), Aspirin products (Goody's, BC Powders, etc) or Herbal medications 7 days prior to surgery.  Do NOT smoke 24 hours prior to surgery.    Do not wear jewelry, make-up or nail polish.  Do not wear lotions, powders, perfumes or deodorant.  Do not shave 48 hours prior to surgery.    Do not bring valuables to the hospital.  [facility] is not responsible for any belongings or valuables.  Contacts, dentures or bridgework may not be worn into surgery.  Leave your suitcase in the car.  After surgery it may be brought to your room.  For patients admitted to the hospital, discharge time will be determined by your treatment team.  [facility] - Preparing for Surgery  Before surgery, you can play an important role.  Because skin is not sterile, your skin needs to be as free of germs as possible.  You can reduce the number of germs on you skin by washing with CHG (chlorahexidine gluconate) soap before surgery.  CHG is an antiseptic cleaner which kills germs and bonds with the skin to continue killing germs even after washing.  Oral Hygiene is also important  in reducing the risk of infection.  Remember to brush your teeth with your regular toothpaste the morning of surgery.  Please DO NOT use if you have an allergy to CHG or antibacterial soaps.  If your skin becomes reddened/irritated stop using the CHG and inform your nurse when you arrive at Short Stay.  Do not shave (including legs and underarms) for at least 48 hours prior to the first CHG shower.  You may shave your face.  Please follow these instructions carefully:   1.  Shower with CHG Soap the night before surgery and the morning of Surgery.  2.  If you choose to wash your hair, wash your hair first as usual with your normal shampoo.  3.  After you shampoo, rinse your hair and body thoroughly to remove the shampoo. 4.  Use CHG as you would any other liquid soap.  You can apply chg directly to the skin and wash gently with a      scrungie or washcloth.           5.  Apply the CHG Soap to your body ONLY FROM THE NECK DOWN.   Do not use on open wounds or open sores. Avoid contact with your eyes, ears, mouth and genitals (private parts).  Wash genitals (private parts) with your normal soap.  6.  Wash thoroughly, paying special attention to the area where your surgery will be  performed.  7.  Thoroughly rinse your body with warm water from the neck down.  8.  DO NOT shower/wash with your normal soap after using and rinsing off the CHG Soap.  9.  Pat yourself dry with a clean towel.            10.  Wear clean pajamas.            11.  Place clean sheets on your bed the night of your first shower and do not sleep with pets.  Day of Surgery  Shower as above. Do not apply any lotions/deodorants the morning of surgery.   Please wear clean clothes to the hospital. Remember to brush your teeth with toothpaste.   Please read over the fact sheets that you were given.

## 2018-07-12 ENCOUNTER — Other Ambulatory Visit: Payer: Self-pay

## 2018-07-12 ENCOUNTER — Encounter (HOSPITAL_COMMUNITY): Payer: Self-pay

## 2018-07-12 ENCOUNTER — Encounter (HOSPITAL_COMMUNITY)
Admission: RE | Admit: 2018-07-12 | Discharge: 2018-07-12 | Disposition: A | Discharge status: Home / Self Care | Payer: Medicare HMO | Source: Ambulatory Visit | Attending: Neurosurgery | Admitting: Neurosurgery

## 2018-07-12 DIAGNOSIS — Z01812 Encounter for preprocedural laboratory examination: Secondary | ICD-10-CM | POA: Insufficient documentation

## 2018-07-12 HISTORY — DX: Major depressive disorder, single episode, unspecified: F32.9

## 2018-07-12 HISTORY — DX: Depression, unspecified: F32.A

## 2018-07-12 HISTORY — DX: Pneumonia, unspecified organism: J18.9

## 2018-07-12 LAB — TYPE AND SCREEN
ABO/RH(D): A POS
ANTIBODY SCREEN: NEGATIVE

## 2018-07-12 LAB — CBC
HEMATOCRIT: 48.3 % — AB (ref 36.0–46.0)
HEMOGLOBIN: 15.4 g/dL — AB (ref 12.0–15.0)
MCH: 32.1 pg (ref 26.0–34.0)
MCHC: 31.9 g/dL (ref 30.0–36.0)
MCV: 100.6 fL — AB (ref 80.0–100.0)
Platelets: 293 10*3/uL (ref 150–400)
RBC: 4.8 MIL/uL (ref 3.87–5.11)
RDW: 13.2 % (ref 11.5–15.5)
WBC: 7 10*3/uL (ref 4.0–10.5)
nRBC: 0 % (ref 0.0–0.2)

## 2018-07-12 LAB — BASIC METABOLIC PANEL
Anion gap: 6 (ref 5–15)
BUN: 15 mg/dL (ref 6–20)
CHLORIDE: 107 mmol/L (ref 98–111)
CO2: 29 mmol/L (ref 22–32)
CREATININE: 0.7 mg/dL (ref 0.44–1.00)
Calcium: 8.9 mg/dL (ref 8.9–10.3)
GFR calc Af Amer: >60 mL/min (ref >60)
GFR calc non Af Amer: >60 mL/min (ref >60)
GLUCOSE: 91 mg/dL (ref 70–99)
POTASSIUM: 4 mmol/L (ref 3.5–5.1)
Sodium: 142 mmol/L (ref 135–145)

## 2018-07-12 LAB — SURGICAL PCR SCREEN
MRSA, PCR: NEGATIVE
Staphylococcus aureus: NEGATIVE

## 2018-07-12 NOTE — Progress Notes (Signed)
Pt had a heart cath on 06/17/18 due to having chest pain morning of surgery. Surgery was cancelled and now has been rescheduled. Cath was clear and clearance note was noted on cath report. Pt states she has not had any more chest pain since. Pt is on Aspirin 81 mg and she states she thinks the instructions were for her to stop 48 hours prior to surgery. I asked to be sure to look at the instructions again to verify and she states she would. She also uses BC Powders and states she is weaning her off of those, instructed her to stop that 7 days prior to surgery. Pt instructed not to smoke 24 hours prior to surgery.

## 2018-07-13 ENCOUNTER — Other Ambulatory Visit: Payer: Self-pay | Admitting: Neurosurgery

## 2018-07-13 NOTE — Progress Notes (Addendum)
Anesthesia Chart Review:  Case:  [id number] Date/Time:  07/20/18 0745   Procedure:  PLIF - L4-L5 - L5-S1 (N/A Back)   Anesthesia type:  General   Pre-op diagnosis:  Spondylolisthesis   Location:  [facility] OR ROOM 65 / [place] [facility]   Surgeon:  Earnie Larsson, MD      DISCUSSION: Patient is a 52 year old female scheduled for the above procedure. Surgery was initially scheduled for 05/31/18, but was postponed until due to COPD exacerbation and later cancelled on 06/14/18 when she presented for surgery and reported episodes of sharp chest pain that radiated to her neck and right shoulder. Due to her symptoms and CAD risk factors, her surgery was cancelled and she was set up to see cardiologist Dr. Geraldo Pitter for further evaluation. Since that time she underwent cardiac cath showing normal coronaries and EF, although with diffuse spasm responsive to nitrates. She was started on Imdur for vasospasm and cleared for surgery at "low risk." (Of note, she contacted Dr. Geraldo Pitter on 06/21/18 to report sweating, headaches, fatigue since starting Imdur. Her left eye also became swollen, so she stopped the medication due to side effects. He will discuss further at her next visit.)   History includes smoking, COPD, asthma, dyspnea, hypothyroidism, GERD, fibromyalgia.   She denied any acute cardiopulmonary symptoms. If no acute changes then I would anticipate that she can proceed as planned.    VS: BP 136/85   Pulse 100   Temp 36.8 C   Resp 18   Ht 5\' 4"  (1.626 m)   Wt 65.5 kg   SpO2 96%   BMI 24.80 kg/m     PROVIDERS: Octavio Graves, DO is PCP Baltazar Apo, MD is pulmonologist. Last visit 05/25/18 for COPD exacerbation follow-up. She was not yet back to baseline so he recommended postponing her 05/31/18 surgery.  Jyl Heinz, MD is cardiologist.    LABS: Labs reviewed: Acceptable for surgery. (all labs ordered are listed, but only abnormal results are displayed)  Labs Reviewed  CBC - Abnormal; Notable for the  following components:      Result Value   Hemoglobin 15.4 (*)    HCT 48.3 (*)    MCV 100.6 (*)    All other components within normal limits  SURGICAL PCR SCREEN  BASIC METABOLIC PANEL  TYPE AND SCREEN    PFTs 01/05/14: PRE-BD: FVC 2.97 (85%), FEV1 1.48 (53%). POST-BD: FVC 2.62 (75%), FEV1 1.25 (45%). TLC 5.35 (109%). DLCO unc 11.06 (48%).    IMAGES: CXR 05/24/18: IMPRESSION: No active cardiopulmonary disease.   EKG: 06/14/18: NSR, rightward axis.   CV: Cardiac cath 06/17/18:  The left ventricular systolic function is normal.  LV end diastolic pressure is normal.  The left ventricular ejection fraction is 55-65% by visual estimate.  There is no mitral valve regurgitation.  There is no aortic valve stenosis. - Angiographically normal coronary arteries with mild diffuse spasm, nitrate responsive. - Normal LVEF with mildly elevated LVEDP in setting of normal systolic pressures. - Recommendation: Return to short stay for TR band removal and discharge home today. Given the mild coronary spasm noted, we will start Imdur 30 mg nightly. No contraindication for moving forward with surgery. No indication for antiplatelet therapy at this time.   Past Medical History:  Diagnosis Date  . Anxiety   . Arthritis   . Asthma   . Chronic back pain   . Constipation 04/02/2017  . COPD (chronic obstructive pulmonary disease) ([place])   . DDD (degenerative disc disease),  lumbar   . Depression   . Dyspnea   . Fibromyalgia   . GERD (gastroesophageal reflux disease)   . H/O seasonal allergies   . Headache   . Hypothyroidism   . Osteoporosis   . Pneumonia 2008  . Ringing in ear, right   . Sciatica   . Spinal stenosis     Past Surgical History:  Procedure Laterality Date  . APPENDECTOMY    . CHOLECYSTECTOMY    . LEFT HEART CATH AND CORONARY ANGIOGRAPHY N/A 06/17/2018   Procedure: LEFT HEART CATH AND CORONARY ANGIOGRAPHY;  Surgeon: Leonie Man, MD;  Location: [place] [facility];  Service: Cardiovascular;  Laterality: N/A;    MEDICATIONS: . Aspirin-Salicylamide-Caffeine (ARTHRITIS STRENGTH BC POWDER PO)  . acetaminophen (TYLENOL) 650 MG CR tablet  . albuterol (PROVENTIL HFA;VENTOLIN HFA) 108 (90 Base) MCG/ACT inhaler  . ALPRAZolam (XANAX) 1 MG tablet  . aspirin EC 81 MG tablet  . bisacodyl (DULCOLAX) 5 MG EC tablet  . carboxymethylcellulose (REFRESH PLUS) 0.5 % SOLN  . cyclobenzaprine (FLEXERIL) 10 MG tablet  . esomeprazole (NEXIUM) 40 MG capsule  . fluticasone (FLONASE) 50 MCG/ACT nasal spray  . Fluticasone-Umeclidin-Vilant (TRELEGY ELLIPTA) 100-62.5-25 MCG/INH AEPB  . gabapentin (NEURONTIN) 300 MG capsule  . ipratropium-albuterol (DUONEB) 0.5-2.5 (3) MG/3ML SOLN  . isosorbide mononitrate (IMDUR) 30 MG 24 hr tablet  . levothyroxine (SYNTHROID, LEVOTHROID) 25 MCG tablet  . magic mouthwash w/lidocaine SOLN  . Multiple Vitamins-Minerals (MULTIVITAMIN & MINERAL PO)  . nicotine (NICODERM CQ - DOSED IN MG/24 HOURS) 14 mg/24hr patch  . nitroGLYCERIN (NITROSTAT) 0.4 MG SL tablet  . oxyCODONE (ROXICODONE) 15 MG immediate release tablet  . Phenylephrine-DM-GG (MUCINEX FAST-MAX CONGEST COUGH PO)  . Probiotic Product (ALIGN) 4 MG CAPS  . sodium chloride (OCEAN) 0.65 % SOLN nasal spray  . Teriparatide, Recombinant, (FORTEO) 600 MCG/2.4ML SOLN  . Wheat Dextrin (BENEFIBER PO)   No current facility-administered medications for this encounter.   She knows to hold ASA for surgery, but reported she needs to review surgeon instructions to clarify the timing. She was told to follow-up with surgeon if still unclear. I also let Lorriane Shire from Dr. Marchelle Folks office know so she can follow-up with patient.   George Hugh [facility] Short Stay Center/Anesthesiology Phone [phone] 07/14/2018 10:38 AM

## 2018-07-14 NOTE — Anesthesia Preprocedure Evaluation (Deleted)
Anesthesia Evaluation    Airway        Dental   Pulmonary Current Smoker,           Cardiovascular      Neuro/Psych    GI/Hepatic   Endo/Other    Renal/GU      Musculoskeletal   Abdominal   Peds  Hematology   Anesthesia Other Findings   Reproductive/Obstetrics                             Anesthesia Physical Anesthesia Plan  ASA:   Anesthesia Plan:    Post-op Pain Management:    Induction:   PONV Risk Score and Plan:   Airway Management Planned:   Additional Equipment:   Intra-op Plan:   Post-operative Plan:   Informed Consent:   Plan Discussed with:   Anesthesia Plan Comments: (See PAT note written 07/14/2018 by Myra Gianotti, PA-C. )        Anesthesia Quick Evaluation

## 2018-07-20 ENCOUNTER — Encounter (HOSPITAL_COMMUNITY): Admission: RE | Payer: Self-pay | Source: Ambulatory Visit

## 2018-07-20 ENCOUNTER — Inpatient Hospital Stay (HOSPITAL_COMMUNITY): Admission: RE | Admit: 2018-07-20 | Payer: Medicare HMO | Source: Ambulatory Visit | Admitting: Neurosurgery

## 2018-07-20 SURGERY — POSTERIOR LUMBAR FUSION 2 LEVEL
Anesthesia: General | Site: Back

## 2018-07-26 ENCOUNTER — Ambulatory Visit: Payer: Medicare HMO | Admitting: Cardiology

## 2018-07-29 ENCOUNTER — Ambulatory Visit (INDEPENDENT_AMBULATORY_CARE_PROVIDER_SITE_OTHER): Payer: Medicare HMO | Admitting: Internal Medicine

## 2018-07-29 ENCOUNTER — Encounter (INDEPENDENT_AMBULATORY_CARE_PROVIDER_SITE_OTHER): Payer: Self-pay | Admitting: Internal Medicine

## 2018-07-29 VITALS — BP 110/80 | HR 76 | Temp 98.2°F | Resp 18 | Ht 64.0 in | Wt 143.6 lb

## 2018-07-29 DIAGNOSIS — R195 Other fecal abnormalities: Secondary | ICD-10-CM | POA: Diagnosis not present

## 2018-07-29 NOTE — Addendum Note (Signed)
Addended by: Butch Penny on: 07/29/2018 01:25 PM   Modules accepted: Orders

## 2018-07-29 NOTE — Patient Instructions (Addendum)
The risks of bleeding, perforation and infection were reviewed with patient. Colonoscopy with propofol.

## 2018-07-29 NOTE — Progress Notes (Addendum)
Subjective:    Patient ID: Gloria Calderon, female    DOB: 1966-03-29, 52 y.o.   MRN: [id number]  HPI  Referred by Dr. Melina Copa for positive cologuard.  Sometimes she sees blood with her BM. Hx of constipation all her life. No family hx of colon cancer. No change in her stools. Her appetite is good. No weight loss.  Usually has a BM daily.  No abdominal pain . Has never undergone a colonoscopy in the past. Patient is planning a spinal fusion 08/03/2018.  Hx of severe COPD. She does vap.  Review of Systems Past Medical History:  Diagnosis Date  . Anxiety   . Arthritis   . Asthma   . Chronic back pain   . Constipation 04/02/2017  . COPD (chronic obstructive pulmonary disease) ([place])   . DDD (degenerative disc disease), lumbar   . Depression   . Dyspnea   . Fibromyalgia   . GERD (gastroesophageal reflux disease)   . H/O seasonal allergies   . Headache   . Hypothyroidism   . Osteoporosis   . Pneumonia 2008  . Ringing in ear, right   . Sciatica   . Spinal stenosis     Past Surgical History:  Procedure Laterality Date  . APPENDECTOMY    . CHOLECYSTECTOMY    . LEFT HEART CATH AND CORONARY ANGIOGRAPHY N/A 06/17/2018   Procedure: LEFT HEART CATH AND CORONARY ANGIOGRAPHY;  Surgeon: Leonie Man, MD;  Location: [place] [facility];  Service: Cardiovascular;  Laterality: N/A;    Allergies  Allergen Reactions  . Isosorbide Nausea And Vomiting and Other (See Comments)    Severe headache  . Levaquin [Levofloxacin] Hives  . Morphine And Related Itching and Nausea And Vomiting    Current Outpatient Medications on File Prior to Visit  Medication Sig Dispense Refill  . acetaminophen (TYLENOL) 650 MG CR tablet Take 1,300 mg by mouth every 4 (four) hours as needed for pain.    Marland Kitchen albuterol (PROVENTIL HFA;VENTOLIN HFA) 108 (90 Base) MCG/ACT inhaler Inhale 2 puffs every 6 (six) hours as needed into the lungs for wheezing or shortness of breath. 1 Inhaler 1  . ALPRAZolam (XANAX)  1 MG tablet Take 1 tablet (1 mg total) by mouth 4 (four) times daily as needed for anxiety. (Patient taking differently: Take 2 mg by mouth at bedtime. ) 120 tablet 0  . aspirin EC 81 MG tablet Take 1 tablet (81 mg total) by mouth daily. 90 tablet 3  . Aspirin-Salicylamide-Caffeine (ARTHRITIS STRENGTH BC POWDER PO) Take by mouth 3 (three) times daily as needed (tapering off, now only 1 day if needed).    . bisacodyl (DULCOLAX) 5 MG EC tablet Take 5-10 mg by mouth daily as needed for moderate constipation.     . carboxymethylcellulose (REFRESH PLUS) 0.5 % SOLN Place 1 drop into both eyes 2 (two) times daily as needed (for dry eyes).     . cyclobenzaprine (FLEXERIL) 10 MG tablet Take 30 mg by mouth at bedtime.    Marland Kitchen esomeprazole (NEXIUM) 40 MG capsule Take 40 mg by mouth daily.    . fluticasone (FLONASE) 50 MCG/ACT nasal spray Place 1 spray into both nostrils daily.    . Fluticasone-Umeclidin-Vilant (TRELEGY ELLIPTA) 100-62.5-25 MCG/INH AEPB Inhale 1 puff into the lungs daily. 1 each 0  . gabapentin (NEURONTIN) 300 MG capsule Take 300-600 mg by mouth See admin instructions. Take 300 mg by mouth in the morning, take 600 mg by mouth at noon and take 600  mg by mouth at bedtime    . ipratropium-albuterol (DUONEB) 0.5-2.5 (3) MG/3ML SOLN Take 3 mLs every 6 (six) hours as needed by nebulization (shortness of breath). 360 mL 1  . levothyroxine (SYNTHROID, LEVOTHROID) 25 MCG tablet Take 25 mcg by mouth daily before breakfast.   11  . magic mouthwash w/lidocaine SOLN Take 5 mLs by mouth 3 (three) times daily as needed for mouth pain. Swish and spit, do not swallow 100 mL 0  . Multiple Vitamins-Minerals (MULTIVITAMIN & MINERAL PO) Take 1 tablet by mouth daily. Centrum Multivitamin    . nicotine (NICODERM CQ - DOSED IN MG/24 HOURS) 14 mg/24hr patch Place 1 patch (14 mg total) onto the skin daily. (Patient taking differently: Place 14 mg onto the skin as needed. ) 28 patch 0  . oxyCODONE (ROXICODONE) 15 MG immediate  release tablet Take 15 mg by mouth See admin instructions. Take 15 mg by mouth every 5-6 hours as needed for severe pain    . Phenylephrine-DM-GG (MUCINEX FAST-MAX CONGEST COUGH PO) Take 20 mLs by mouth at bedtime.     . Probiotic Product (ALIGN) 4 MG CAPS Take 8 mg by mouth at bedtime.     . sodium chloride (OCEAN) 0.65 % SOLN nasal spray Place 2 sprays into both nostrils 3 (three) times daily as needed for congestion.     . Teriparatide, Recombinant, (FORTEO) 600 MCG/2.4ML SOLN Inject 20 mcg into the skin daily.    . Wheat Dextrin (BENEFIBER PO) Take 1 packet by mouth 3 (three) times daily.      No current facility-administered medications on file prior to visit.         Objective:   Physical Exam Blood pressure 110/80, pulse 76, temperature 98.2 F (36.8 C), temperature source Oral, resp. rate 18, height 5\' 4"  (1.626 m), weight 143 lb 9.6 oz (65.1 kg). Alert and oriented. Skin warm and dry. Oral mucosa is moist.   . Sclera anicteric, conjunctivae is pink. Thyroid not enlarged. No cervical lymphadenopathy. Lungs clear. Heart regular rate and rhythm.  Abdomen is soft. Bowel sounds are positive. No hepatomegaly. No abdominal masses felt. No tenderness.  No edema to lower extremities.         Assessment & Plan:  Positive cologuard. Colonic neoplasm needs to be ruled out.  The risks of bleeding, perforation and infection were reviewed with patient.  Will need a release from Dr.Kilpatrick before we schedule the colonoscopy with propofol.

## 2018-08-02 ENCOUNTER — Ambulatory Visit: Payer: Medicare HMO | Admitting: Emergency Medicine

## 2018-08-02 ENCOUNTER — Encounter: Payer: Self-pay | Admitting: Emergency Medicine

## 2018-08-02 DIAGNOSIS — J431 Panlobular emphysema: Secondary | ICD-10-CM | POA: Diagnosis not present

## 2018-08-02 DIAGNOSIS — Z720 Tobacco use: Secondary | ICD-10-CM

## 2018-08-02 MED ORDER — FLUTICASONE-UMECLIDIN-VILANT 100-62.5-25 MCG/INH IN AEPB: 1.0000 | INHALATION_SPRAY | Freq: Every day | RESPIRATORY_TRACT | 0 refills | Status: AC

## 2018-08-02 NOTE — Progress Notes (Signed)
Subjective:    Patient ID: Gloria Calderon, female    DOB: [birth year], 52 y.o.   MRN: [id number]  COPD  She complains of cough, shortness of breath and wheezing. Associated symptoms include postnasal drip. Pertinent negatives include no appetite change, ear pain, fever, headaches, rhinorrhea, sneezing, sore throat or trouble swallowing. Her past medical history is significant for COPD.   ROV 01/12/18 --52 year old smoker with a history of COPD and severe obstruction documented on prior pulmonary function testing.  I saw her earlier this month and we undertook a trial of Trelegy to see if she would benefit (as a substitute for bevespi).  We also worked on changing her to a Therapist, nutritional - she has it now, set on 3L/min pulsed. She feels better on the Trelegy, breathing better, helps her clear secretions better. She is using Duoneb about once a day now. She is doing fairly well currently. Notes that she is about to lose medicaid. She has cut her cigarettes down to 10/dayu, also using vapor. She is interested in getting her xanax increased (stopping smoking has increased her anxiety). She is planning to discuss with Dr Melina Copa in the near future when she returns from vacation.   ROV 05/25/18 --patient follows up today for severe COPD, associated hypoxemic respiratory failure, tobacco use and anxiety.  I changed her over to Trelegy last time and she benefited, less dyspnea and cough. Unfortunately she hsa been dealing w increased SOB and cough for 10 days. She has taken prednisone taper, finished 2 days ago. She is better but not back to her usual baseline. She is still having some chest tightness. She is smoking 1 pk a week. She is supposed to have spinal fusion on 9/16. No real head congestion.   ROV 08/02/18 --52 year old active smoker with severe COPD and associated hypoxemic respiratory failure.  She also uses vaporized CBD oil.  We have been managing her on Trelegy but the cough has been  prohibitive, she has not been taking it every day in order to stretch the medication out. Has been costing her around $50. She is using albuterol 1-3x a day. Uses DuoNeb about every day.   At her last visit 2 months ago she was prescribed azithromycin for an acute bronchitis. Still gets winded with housework. She is smoking 1/2 pk a day. Vapes a few times a day. No further flares since last time. No pred.    Review of Systems  Constitutional: Negative for appetite change, fever and unexpected weight change.  HENT: Positive for congestion and postnasal drip. Negative for dental problem, ear pain, nosebleeds, rhinorrhea, sinus pressure, sneezing, sore throat and trouble swallowing.   Eyes: Negative for redness and itching.  Respiratory: Positive for cough, chest tightness, shortness of breath and wheezing.   Cardiovascular: Negative for palpitations and leg swelling.  Gastrointestinal: Negative for abdominal pain, nausea and vomiting.  Genitourinary: Negative for dysuria.  Musculoskeletal: Negative for joint swelling.  Skin: Negative for rash.  Neurological: Negative for headaches.  Hematological: Does not bruise/bleed easily.  Psychiatric/Behavioral: Negative for dysphoric mood. The patient is not nervous/anxious.        Objective:   Physical Exam Vitals:   08/02/18 1544  BP: 110/76  Pulse: (!) 106  SpO2: 90%  Weight: 144 lb (65.3 kg)  Height: 5\' 4"  (1.626 m)   Gen: Pleasant, well-nourished, in no distress, ill appearing  ENT: No lesions,  mouth clear,  oropharynx clear, nasal congestion, some hoarse voice  Neck: No JVD, no  stridor  Lungs: No use of accessory muscles, no wheeze, distant  Cardiovascular: RRR, heart sounds normal, no murmur or gallops, no peripheral edema  Musculoskeletal: No deformities, no cyanosis or clubbing  Neuro: alert, non focal  Skin: Warm, no lesions or rashes      Assessment & Plan:  Tobacco abuse We talked today about decreasing your smoking.   You need to set a goal to get down to 5 cigarettes daily.  Once you have accomplished that we can talk about decreasing further. We talked today about the health risks and dangers of vaping.  I strongly advise you to stop given the associated risks.  COPD (chronic obstructive pulmonary disease) ([place]) Please continue your Trelegy 1 inhalation once daily.  Remember to rinse and gargle after using.  We will try to work on financial assistance to help keep you in good supply with this medicine. Keep your albuterol available to use 2 puffs up to every 4 hours if needed for shortness of breath, chest tightness, wheezing. Keep your DuoNeb available to use up to every 6 hours if you need it for shortness of breath Flu and pneumonia vaccines are up-to-date  Baltazar Apo, MD, PhD 08/02/2018, 4:04 PM [facility] [phone] or if no answer [phone]

## 2018-08-02 NOTE — Assessment & Plan Note (Signed)
We talked today about decreasing your smoking.  You need to set a goal to get down to 5 cigarettes daily.  Once you have accomplished that we can talk about decreasing further. We talked today about the health risks and dangers of vaping.  I strongly advise you to stop given the associated risks.

## 2018-08-02 NOTE — Patient Instructions (Addendum)
Please continue your Trelegy 1 inhalation once daily.  Remember to rinse and gargle after using.  We will try to work on financial assistance to help keep you in good supply with this medicine. Keep your albuterol available to use 2 puffs up to every 4 hours if needed for shortness of breath, chest tightness, wheezing. Keep your DuoNeb available to use up to every 6 hours if you need it for shortness of breath We talked today about decreasing your smoking.  You need to set a goal to get down to 5 cigarettes daily.  Once you have accomplished that we can talk about decreasing further. We talked today about the health risks and dangers of vaping.  I strongly advise you to stop given the associated risks. Flu shot up-to-date, pneumonia shot up-to-date Follow with Dr Lamonte Sakai in 6 months or sooner if you have any problems

## 2018-08-02 NOTE — Assessment & Plan Note (Signed)
Please continue your Trelegy 1 inhalation once daily.  Remember to rinse and gargle after using.  We will try to work on financial assistance to help keep you in good supply with this medicine. Keep your albuterol available to use 2 puffs up to every 4 hours if needed for shortness of breath, chest tightness, wheezing. Keep your DuoNeb available to use up to every 6 hours if you need it for shortness of breath Flu and pneumonia vaccines are up-to-date

## 2018-10-07 ENCOUNTER — Telehealth (INDEPENDENT_AMBULATORY_CARE_PROVIDER_SITE_OTHER): Payer: Self-pay | Admitting: Internal Medicine

## 2018-10-07 NOTE — Telephone Encounter (Signed)
Patient called stated she is ready to schedule her colonoscopy - stated she had to cancel a few months ago  -  Ph# [phone]

## 2018-10-07 NOTE — Telephone Encounter (Signed)
Patient would like for you to call her at [phone]

## 2018-10-07 NOTE — Telephone Encounter (Signed)
I spoke with her husband. I have not received a release from her back surgery. She will call u back.

## 2018-10-07 NOTE — Telephone Encounter (Signed)
Patient called to schedule TCS, your note mentions we need a release from [facility] before we schedule the colonoscopy with propofol -- did we get this - please advise

## 2018-10-11 NOTE — Telephone Encounter (Signed)
No answer at home #

## 2018-10-18 ENCOUNTER — Telehealth (INDEPENDENT_AMBULATORY_CARE_PROVIDER_SITE_OTHER): Payer: Self-pay | Admitting: Internal Medicine

## 2018-10-18 NOTE — Telephone Encounter (Signed)
Patient would like you to call her back at ph# [phone]

## 2018-10-22 ENCOUNTER — Telehealth: Payer: Self-pay | Admitting: Emergency Medicine

## 2018-10-22 MED ORDER — DOXYCYCLINE HYCLATE 100 MG PO TABS: 100.0000 mg | ORAL_TABLET | Freq: Two times a day (BID) | ORAL | 0 refills | Status: DC

## 2018-10-22 NOTE — Telephone Encounter (Signed)
lmtcb x1 pt 

## 2018-10-22 NOTE — Telephone Encounter (Signed)
She probably needs to be seen (somewhere) and checked for Flu as she is still in the window for possible Tamiflu. OK to give her doxycycline 100mg  bid x 7 days for superimposed bronchitis. If still ill next week needs to be seen here.

## 2018-10-22 NOTE — Telephone Encounter (Signed)
Pt returning call CB# [phone]

## 2018-10-22 NOTE — Telephone Encounter (Signed)
Called and spoke with Patient. Dr Lamonte Sakai recommendations given.  Understanding stated.  Prescription sent to request [place].  Nothing further at this time.

## 2018-10-22 NOTE — Telephone Encounter (Signed)
Primary Pulmonologist: RB Last office visit and with whom: 08/02/18 What do we see them for (pulmonary problems): emphysema Last OV assessment/plan:   Was appointment offered to patient (explain)?  Yes, pt did not want to drive to [facility]   Reason for call: Pt states she stated to not feel good on 10/20/18 with a sore throat.  10/21/18 pt states she has increased productive cough but does not know color of mucus as she spits in the garbage.  Fever of 102.1 last night with chills and aches.  Pt has used albuterol 3 to 4 times a day since 10/20/18 and continues to use Trelegy.  She has tried tylenol cold with minimal relief. Dr. Lamonte Sakai please advise.

## 2018-10-25 ENCOUNTER — Telehealth: Payer: Self-pay | Admitting: Emergency Medicine

## 2018-10-25 NOTE — Telephone Encounter (Signed)
Spoke with pt's husband, Fritz Pickerel. Pt is currently admitted at [facility] in [place], [place]. The pt doesn't feel like she is being treated the way she should be in regards to her breathing issues. They are wanting the pt to be transferred to [facility]. Advised him that we do not have privileges at [facility] so we could not have the pt transferred. He states that he will speak to management at [facility] to see what they could do about getting her transferred. Nothing further was needed at this time.

## 2018-11-03 ENCOUNTER — Ambulatory Visit (INDEPENDENT_AMBULATORY_CARE_PROVIDER_SITE_OTHER): Payer: Medicare HMO | Admitting: Primary Care

## 2018-11-03 ENCOUNTER — Encounter: Payer: Self-pay | Admitting: Primary Care

## 2018-11-03 VITALS — BP 116/70 | HR 94 | Ht 64.0 in | Wt 134.0 lb

## 2018-11-03 DIAGNOSIS — J181 Lobar pneumonia, unspecified organism: Secondary | ICD-10-CM

## 2018-11-03 DIAGNOSIS — J101 Influenza due to other identified influenza virus with other respiratory manifestations: Secondary | ICD-10-CM

## 2018-11-03 DIAGNOSIS — J189 Pneumonia, unspecified organism: Secondary | ICD-10-CM | POA: Insufficient documentation

## 2018-11-03 DIAGNOSIS — J9611 Chronic respiratory failure with hypoxia: Secondary | ICD-10-CM | POA: Diagnosis not present

## 2018-11-03 MED ORDER — IPRATROPIUM-ALBUTEROL 0.5-2.5 (3) MG/3ML IN SOLN: 3.0000 mL | Freq: Four times a day (QID) | RESPIRATORY_TRACT | 1 refills | Status: DC | PRN

## 2018-11-03 MED ORDER — ALBUTEROL SULFATE HFA 108 (90 BASE) MCG/ACT IN AERS: 2.0000 | INHALATION_SPRAY | Freq: Four times a day (QID) | RESPIRATORY_TRACT | 1 refills | Status: DC | PRN

## 2018-11-03 MED ORDER — PREDNISONE 10 MG PO TABS: ORAL_TABLET | ORAL | 0 refills | Status: DC

## 2018-11-03 MED ORDER — METHYLPREDNISOLONE ACETATE 80 MG/ML IJ SUSP
60.0000 mg | Freq: Once | INTRAMUSCULAR | Status: AC
  Administered 2018-11-03: 60 mg via INTRAMUSCULAR

## 2018-11-03 MED ORDER — IPRATROPIUM-ALBUTEROL 0.5-2.5 (3) MG/3ML IN SOLN
3.0000 mL | RESPIRATORY_TRACT | Status: AC
  Administered 2018-11-03: 3 mL via RESPIRATORY_TRACT

## 2018-11-03 NOTE — Patient Instructions (Addendum)
Office treatment: - Duoneb x 1 - Depo-medrol 60mg  x 1  RX: -Prednisone taper (50mg  x 5 days; 40mg  x 2 days; 30mg  x 2 days; 20mg  x 2 days; 10mg  x 2 days)  Recommendations: - Continue Trelegy as prescribed  - Scheduled duoneb every 6 hours (Max 6 doses in 24 hours) - Mucinex 1-2 tabs twice daily (Max 4 tabs in 24 hours) - Flutter valve every hour while awake - Encourage oral hydration ( eight 8oz glasses of fluid a day)  FU: -  Dr. Lamonte Sakai on Monday (9:45 is open???)  Influenza, Adult Influenza is also called "the flu." It is an infection in the lungs, nose, and throat (respiratory tract). It is caused by a virus. The flu causes symptoms that are similar to symptoms of a cold. It also causes a high fever and body aches. The flu spreads easily from person to person (is contagious). Getting a flu shot (influenza vaccination) every year is the best way to prevent the flu. What are the causes? This condition is caused by the influenza virus. You can get the virus by:  Breathing in droplets that are in the air from the cough or sneeze of a person who has the virus.  Touching something that has the virus on it (is contaminated) and then touching your mouth, nose, or eyes. What increases the risk? Certain things may make you more likely to get the flu. These include:  Not washing your hands often.  Having close contact with many people during cold and flu season.  Touching your mouth, eyes, or nose without first washing your hands.  Not getting a flu shot every year. You may have a higher risk for the flu, along with serious problems such as a lung infection (pneumonia), if you:  Are older than 65.  Are pregnant.  Have a weakened disease-fighting system (immune system) because of a disease or taking certain medicines.  Have a long-term (chronic) illness, such as: ? Heart, kidney, or lung disease. ? Diabetes. ? Asthma.  Have a liver disorder.  Are very overweight (morbidly  obese).  Have anemia. This is a condition that affects your red blood cells. What are the signs or symptoms? Symptoms usually begin suddenly and last 4-14 days. They may include:  Fever and chills.  Headaches, body aches, or muscle aches.  Sore throat.  Cough.  Runny or stuffy (congested) nose.  Chest discomfort.  Not wanting to eat as much as normal (poor appetite).  Weakness or feeling tired (fatigue).  Dizziness.  Feeling sick to your stomach (nauseous) or throwing up (vomiting). How is this treated? If the flu is found early, you can be treated with medicine that can help reduce how bad the illness is and how long it lasts (antiviral medicine). This may be given by mouth (orally) or through an IV tube. Taking care of yourself at home can help your symptoms get better. Your doctor may suggest:  Taking over-the-counter medicines.  Drinking plenty of fluids. The flu often goes away on its own. If you have very bad symptoms or other problems, you may be treated in a hospital. Follow these instructions at home:     Activity  Rest as needed. Get plenty of sleep.  Stay home from work or school as told by your doctor. ? Do not leave home until you do not have a fever for 24 hours without taking medicine. ? Leave home only to visit your doctor. Eating and drinking  Take an ORS (  oral rehydration solution). This is a drink that is sold at pharmacies and stores.  Drink enough fluid to keep your pee (urine) pale yellow.  Drink clear fluids in small amounts as you are able. Clear fluids include: ? Water. ? Ice chips. ? Fruit juice that has water added (diluted fruit juice). ? Low-calorie sports drinks.  Eat bland, easy-to-digest foods in small amounts as you are able. These foods include: ? Bananas. ? Applesauce. ? Rice. ? Lean meats. ? Toast. ? Crackers.  Do not eat or drink: ? Fluids that have a lot of sugar or caffeine. ? Alcohol. ? Spicy or fatty  foods. General instructions  Take over-the-counter and prescription medicines only as told by your doctor.  Use a cool mist humidifier to add moisture to the air in your home. This can make it easier for you to breathe.  Cover your mouth and nose when you cough or sneeze.  Wash your hands with soap and water often, especially after you cough or sneeze. If you cannot use soap and water, use alcohol-based hand sanitizer.  Keep all follow-up visits as told by your doctor. This is important. How is this prevented?   Get a flu shot every year. You may get the flu shot in late summer, fall, or winter. Ask your doctor when you should get your flu shot.  Avoid contact with people who are sick during fall and winter (cold and flu season). Contact a doctor if:  You get new symptoms.  You have: ? Chest pain. ? Watery poop (diarrhea). ? A fever.  Your cough gets worse.  You start to have more mucus.  You feel sick to your stomach.  You throw up. Get help right away if you:  Have shortness of breath.  Have trouble breathing.  Have skin or nails that turn a bluish color.  Have very bad pain or stiffness in your neck.  Get a sudden headache.  Get sudden pain in your face or ear.  Cannot eat or drink without throwing up. Summary  Influenza ("the flu") is an infection in the lungs, nose, and throat. It is caused by a virus.  Take over-the-counter and prescription medicines only as told by your doctor.  Getting a flu shot every year is the best way to avoid getting the flu. This information is not intended to replace advice given to you by your health care provider. Make sure you discuss any questions you have with your health care provider. Document Released: 06/10/2008 Document Revised: 02/17/2018 Document Reviewed: 02/17/2018 Elsevier Interactive Patient Education  2019 [employer] Pneumonia, Adult Pneumonia is an infection of the lungs. It  causes swelling in the airways of the lungs. Mucus and fluid may also build up inside the airways. One type of pneumonia can happen while a person is in a hospital. A different type can happen when a person is not in a hospital (community-acquired pneumonia).  What are the causes?  This condition is caused by germs (viruses, bacteria, or fungi). Some types of germs can be passed from one person to another. This can happen when you breathe in droplets from the cough or sneeze of an infected person. What increases the risk? You are more likely to develop this condition if you:  Have a long-term (chronic) disease, such as: ? Chronic obstructive pulmonary disease (COPD). ? Asthma. ? Cystic fibrosis. ? Congestive heart failure. ? Diabetes. ? Kidney disease.  Have HIV.  Have sickle cell disease.  Have had your spleen removed.  Do not take good care of your teeth and mouth (poor dental hygiene).  Have a medical condition that increases the risk of breathing in droplets from your own mouth and nose.  Have a weakened body defense system (immune system).  Are a smoker.  Travel to areas where the germs that cause this illness are common.  Are around certain animals or the places they live. What are the signs or symptoms?  A dry cough.  A wet (productive) cough.  Fever.  Sweating.  Chest pain. This often happens when breathing deeply or coughing.  Fast breathing or trouble breathing.  Shortness of breath.  Shaking chills.  Feeling tired (fatigue).  Muscle aches. How is this treated? Treatment for this condition depends on many things. Most adults can be treated at home. In some cases, treatment must happen in a hospital. Treatment may include:  Medicines given by mouth or through an IV tube.  Being given extra oxygen.  Respiratory therapy. In rare cases, treatment for very bad pneumonia may include:  Using a machine to help you breathe.  Having a procedure to  remove fluid from around your lungs. Follow these instructions at home: Medicines  Take over-the-counter and prescription medicines only as told by your doctor. ? Only take cough medicine if you are losing sleep.  If you were prescribed an antibiotic medicine, take it as told by your doctor. Do not stop taking the antibiotic even if you start to feel better. General instructions   Sleep with your head and neck raised (elevated). You can do this by sleeping in a recliner or by putting a few pillows under your head.  Rest as needed. Get at least 8 hours of sleep each night.  Drink enough water to keep your pee (urine) pale yellow.  Eat a healthy diet that includes plenty of vegetables, fruits, whole grains, low-fat dairy products, and lean protein.  Do not use any products that contain nicotine or tobacco. These include cigarettes, e-cigarettes, and chewing tobacco. If you need help quitting, ask your doctor.  Keep all follow-up visits as told by your doctor. This is important. How is this prevented? A shot (vaccine) can help prevent pneumonia. Shots are often suggested for:  People older than 53 years of age.  People older than 53 years of age who: ? Are having cancer treatment. ? Have long-term (chronic) lung disease. ? Have problems with their body's defense system. You may also prevent pneumonia if you take these actions:  Get the flu (influenza) shot every year.  Go to the dentist as often as told.  Wash your hands often. If you cannot use soap and water, use hand sanitizer. Contact a doctor if:  You have a fever.  You lose sleep because your cough medicine does not help. Get help right away if:  You are short of breath and it gets worse.  You have more chest pain.  Your sickness gets worse. This is very serious if: ? You are an older adult. ? Your body's defense system is weak.  You cough up blood. Summary  Pneumonia is an infection of the lungs.  Most  adults can be treated at home. Some will need treatment in a hospital.  Drink enough water to keep your pee pale yellow.  Get at least 8 hours of sleep each night. This information is not intended to replace advice given to you by your health care provider. Make sure you discuss any  questions you have with your health care provider. Document Released: 02/18/2008 Document Revised: 04/29/2018 Document Reviewed: 04/29/2018 Elsevier Interactive Patient Education  2019 [employer]    Chronic Obstructive Pulmonary Disease Exacerbation Chronic obstructive pulmonary disease (COPD) is a long-term (chronic) lung problem. In COPD, the flow of air from the lungs is limited. COPD exacerbations are times that breathing gets worse and you need more than your normal treatment. Without treatment, they can be life threatening. If they happen often, your lungs can become more damaged. If your COPD gets worse, your doctor may treat you with:  Medicines.  Oxygen.  Different ways to clear your airway, such as using a mask. Follow these instructions at home: Medicines  Take over-the-counter and prescription medicines only as told by your doctor.  If you take an antibiotic or steroid medicine, do not stop taking the medicine even if you start to feel better.  Keep up with shots (vaccinations) as told by your doctor. Be sure to get a yearly (annual) flu shot. Lifestyle  Do not smoke. If you need help quitting, ask your doctor.  Eat healthy foods.  Exercise regularly.  Get plenty of sleep.  Avoid tobacco smoke and other things that can bother your lungs.  Wash your hands often with soap and water. This will help keep you from getting an infection. If you cannot use soap and water, use hand sanitizer.  During flu season, avoid areas that are crowded with people. General instructions  Drink enough fluid to keep your pee (urine) clear or pale yellow. Do not do this if your doctor has told you not  to.  Use a cool mist machine (vaporizer).  If you use oxygen or a machine that turns medicine into a mist (nebulizer), continue to use it as told.  Follow all instructions for rehabilitation. These are steps you can take to make your body work better.  Keep all follow-up visits as told by your doctor. This is important. Contact a doctor if:  Your COPD symptoms get worse than normal. Get help right away if:  You are short of breath and it gets worse.  You have trouble talking.  You have chest pain.  You cough up blood.  You have a fever.  You keep throwing up (vomiting).  You feel weak or you pass out (faint).  You feel confused.  You are not able to sleep because of your symptoms.  You are not able to do daily activities. Summary  COPD exacerbations are times that breathing gets worse and you need more treatment than normal.  COPD exacerbations can be very serious and may cause your lungs to become more damaged.  Do not smoke. If you need help quitting, ask your doctor.  Stay up-to-date on your shots. Get a flu shot every year. This information is not intended to replace advice given to you by your health care provider. Make sure you discuss any questions you have with your health care provider. Document Released: 08/21/2011 Document Revised: 10/06/2016 Document Reviewed: 10/06/2016 Elsevier Interactive Patient Education  2019 [employer].

## 2018-11-03 NOTE — Assessment & Plan Note (Addendum)
-   CXR 2/13 showed small opacity lateral right lung base which may reflect pneumonia or atelectasis. Advanced COPD/emphysema  - Completes abx on 11/04/18 ( Doxycyline 100mg  Bid x 7 days and Cefdinir 300mg  Bid x 7 days ) - Advised mucinex 1-2 tabs twice daily  - Flutter valve every hour while awake

## 2018-11-03 NOTE — Assessment & Plan Note (Signed)
-   Completed Tamiflu - Encourage oral hydration

## 2018-11-03 NOTE — Progress Notes (Addendum)
@Patient  ID: Gloria Calderon, female    DOB: [birth year], 53 y.o.   MRN: [id number]  Chief Complaint  Patient presents with  . Follow-up    SOB, slight cough and chest tightness x1 month    Referring provider: Octavio Graves, DO  HPI: 53 year old, current smoker (hx of vaping). PMH severe COPD, chronic respiratory failure with hypoxia, GERD, anxiety. Patient of Dr. Lamonte Sakai, last seen November 2019. Maintained on Trelegy, Albuterol hfa and Duonebs. Smoking cessation strongly encouraged.   Called on 10/22/18 with complaints of productive cough, 102 fever and HA. Dr. Lamonte Sakai called in Doxycycline for superimposed bronchitis and instructed to be checked for the flu. She was admitted to [facility] for COPD exacerbation, right lobe pneumonia and influenza A. Completed tamiflu. Continued doxycyline course and was started on Cefdinir and prednisone taper.   11/03/2018 Patient presents today for hospital follow-up. Accompanied by her husband today. Continues to have sob, chest tightness and dry cough since discharge on 10/28/18. Reports chest congestion, not coughing up the much.  She has one day left of antibiotics and prednisone taper. Need refill duoneb and albuterol hfa. Has flutter valve at home but has not used it. States that she had a bad experience during her hospitalization. Her issue was that she was given isosorbide nitrate for 3 days and reports allergy and facial swelling reaction. Afebrile today.    Allergies  Allergen Reactions  . Isosorbide Nausea And Vomiting and Other (See Comments)    Severe headache  . Levaquin [Levofloxacin] Hives  . Morphine And Related Itching and Nausea And Vomiting    Immunization History  Administered Date(s) Administered  . Influenza Split 08/15/2013  . Influenza,inj,Quad PF,6+ Mos 05/25/2014, 07/22/2017, 07/08/2018  . Pneumococcal Polysaccharide-23 07/30/2015, 07/22/2017    Past Medical History:  Diagnosis Date  . Anxiety   .  Arthritis   . Asthma   . Chronic back pain   . Constipation 04/02/2017  . COPD (chronic obstructive pulmonary disease) ([place])   . DDD (degenerative disc disease), lumbar   . Depression   . Dyspnea   . Fibromyalgia   . GERD (gastroesophageal reflux disease)   . H/O seasonal allergies   . Headache   . Hypothyroidism   . Osteoporosis   . Pneumonia 2008  . Ringing in ear, right   . Sciatica   . Spinal stenosis     Tobacco History: Social History   Tobacco Use  Smoking Status Former Smoker  . Packs/day: 0.50  . Years: 30.00  . Pack years: 15.00  . Types: Cigarettes, E-cigarettes  . Last attempt to quit: 10/21/2018  . Years since quitting: 0.0  Smokeless Tobacco Never Used   Counseling given: Not Answered   Outpatient Medications Prior to Visit  Medication Sig Dispense Refill  . acetaminophen (TYLENOL) 650 MG CR tablet Take 1,000 mg by mouth 2 (two) times daily.     Marland Kitchen ALPRAZolam (XANAX) 1 MG tablet Take 1 tablet (1 mg total) by mouth 4 (four) times daily as needed for anxiety. 120 tablet 0  . bisacodyl (DULCOLAX) 5 MG EC tablet Take 5-10 mg by mouth daily as needed for moderate constipation.     . Carboxymethylcellulose Sodium (REFRESH PLUS OP) Apply to eye 2 (two) times daily.    . Cranberry-Vitamin C-Vitamin E (CRANBERRY PLUS VITAMIN C PO) Take 2 tablets by mouth daily.    . cyclobenzaprine (FLEXERIL) 10 MG tablet Take 30 mg by mouth at bedtime.    Marland Kitchen  doxycycline (VIBRA-TABS) 100 MG tablet Take 1 tablet (100 mg total) by mouth 2 (two) times daily. 14 tablet 0  . Fluticasone-Umeclidin-Vilant (TRELEGY ELLIPTA) 100-62.5-25 MCG/INH AEPB Inhale 1 puff into the lungs daily. 1 each 0  . gabapentin (NEURONTIN) 300 MG capsule Take 300-600 mg by mouth See admin instructions. Take 300 mg by mouth in the morning, take 300 mg by mouth at noon, one cap in the evening and 2 caps at bedtime    . levothyroxine (SYNTHROID, LEVOTHROID) 25 MCG tablet Take 25 mcg by mouth daily before breakfast.    11  . magic mouthwash w/lidocaine SOLN Take 5 mLs by mouth 3 (three) times daily as needed for mouth pain. Swish and spit, do not swallow 100 mL 0  . magnesium 30 MG tablet Take 500 mg by mouth 2 (two) times daily.    . Misc Natural Products (FOCUSED MIND PO) Take by mouth daily.    . Multiple Vitamins-Minerals (HAIR SKIN AND NAILS FORMULA PO) Take 2 tablets by mouth.    . Multiple Vitamins-Minerals (MULTIVITAMIN & MINERAL PO) Take 1 tablet by mouth daily. Centrum Multivitamin    . nicotine (NICODERM CQ - DOSED IN MG/24 HOURS) 14 mg/24hr patch Place 1 patch (14 mg total) onto the skin daily. 28 patch 0  . NON FORMULARY Beet rot 2 tabs tid    . NON FORMULARY CBD oil 1500mg  daily    . omeprazole (PRILOSEC) 40 MG capsule Take 40 mg by mouth daily.    Marland Kitchen oxyCODONE (ROXICODONE) 15 MG immediate release tablet Take 15 mg by mouth See admin instructions. Take 15 mg by mouth every 5-6 hours as needed for severe pain    . Probiotic Product (ALIGN) 4 MG CAPS Take 8 mg by mouth at bedtime.     . sodium chloride (OCEAN) 0.65 % SOLN nasal spray Place 2 sprays into both nostrils 3 (three) times daily as needed for congestion.     . Teriparatide, Recombinant, (FORTEO) 600 MCG/2.4ML SOLN Inject 20 mcg into the skin daily.    . Wheat Dextrin (BENEFIBER PO) Take 1 packet by mouth 3 (three) times daily.     Marland Kitchen albuterol (PROVENTIL HFA;VENTOLIN HFA) 108 (90 Base) MCG/ACT inhaler Inhale 2 puffs every 6 (six) hours as needed into the lungs for wheezing or shortness of breath. 1 Inhaler 1  . ipratropium-albuterol (DUONEB) 0.5-2.5 (3) MG/3ML SOLN Take 3 mLs every 6 (six) hours as needed by nebulization (shortness of breath). 360 mL 1   No facility-administered medications prior to visit.     Review of Systems  Review of Systems  Constitutional: Positive for fatigue. Negative for chills and fever.  HENT: Positive for congestion.   Respiratory: Positive for cough, chest tightness and shortness of breath.     Cardiovascular: Negative.     Physical Exam  BP 116/70 (BP Location: Right Arm, Cuff Size: Normal)   Pulse 94   Ht 5\' 4"  (1.626 m)   Wt 134 lb (60.8 kg)   SpO2 91%   BMI 23.00 kg/m  Physical Exam Constitutional:      General: She is not in acute distress.    Comments: Chronically ill appearing  Cardiovascular:     Rate and Rhythm: Normal rate and regular rhythm.     Comments: RRR, HR 90 Pulmonary:     Breath sounds: No stridor. Wheezing present.     Comments: Diminished, faint exp wheeze; O2 93% RA at rest Musculoskeletal:     Comments: In [facility]  Skin:    General: Skin is warm and dry.  Neurological:     Mental Status: She is alert and oriented to person, place, and time. Mental status is at baseline.  Psychiatric:        Behavior: Behavior normal.        Thought Content: Thought content normal.      Lab Results:  CBC    Component Value Date/Time   WBC 7.0 07/12/2018 1146   RBC 4.80 07/12/2018 1146   HGB 15.4 (H) 07/12/2018 1146   HCT 48.3 (H) 07/12/2018 1146   PLT 293 07/12/2018 1146   MCV 100.6 (H) 07/12/2018 1146   MCH 32.1 07/12/2018 1146   MCHC 31.9 07/12/2018 1146   RDW 13.2 07/12/2018 1146   LYMPHSABS 3.7 05/24/2018 1121   MONOABS 1.0 05/24/2018 1121   EOSABS 0.1 05/24/2018 1121   BASOSABS 0.1 05/24/2018 1121    BMET    Component Value Date/Time   NA 142 07/12/2018 1146   K 4.0 07/12/2018 1146   CL 107 07/12/2018 1146   CO2 29 07/12/2018 1146   GLUCOSE 91 07/12/2018 1146   BUN 15 07/12/2018 1146   CREATININE 0.70 07/12/2018 1146   CALCIUM 8.9 07/12/2018 1146   GFRNONAA >60 07/12/2018 1146   GFRAA >60 07/12/2018 1146    BNP No results found for: BNP  ProBNP No results found for: PROBNP  Imaging: No results found.   Assessment & Plan:   COPD (chronic obstructive pulmonary disease) (HCC) - Prolonged exacerbation d/t influenza A and right lower lobe pneumonia - Received Duoneb and depo-medrol 60mg  IM in office today  - O2 93% on RA  after nebulizer - Extending prednisone taper (50mg  x 5 days; 40mg  x 2 days; 30mg  x 2 days; 20mg  x 2 days; 10mg  x 2 days) - Advised scheduled Duonebs q6 hours (refill sent) - FU in 5 days with Dr. Lamonte Sakai, if symptoms worsen instructed to present to ED   Influenza A - Completed Tamiflu - Encourage oral hydration  Right lower lobe pneumonia ([place]) - CXR 2/13 showed small opacity lateral right lung base which may reflect pneumonia or atelectasis. Advanced COPD/emphysema  - Completes abx on 11/04/18 ( Doxycyline 100mg  Bid x 7 days and Cefdinir 300mg  Bid x 7 days ) - Advised mucinex 1-2 tabs twice daily  - Flutter valve every hour while awake    Martyn Ehrich, NP 11/03/2018

## 2018-11-03 NOTE — Assessment & Plan Note (Addendum)
-   Prolonged exacerbation d/t influenza A and right lower lobe pneumonia - Received Duoneb and depo-medrol 60mg  IM in office today  - O2 93% on RA after nebulizer - Extending prednisone taper (50mg  x 5 days; 40mg  x 2 days; 30mg  x 2 days; 20mg  x 2 days; 10mg  x 2 days) - Advised scheduled Duonebs q6 hours (refill sent) - FU in 5 days with Dr. Lamonte Sakai, if symptoms worsen instructed to present to ED

## 2018-11-05 ENCOUNTER — Telehealth: Payer: Self-pay

## 2018-11-05 NOTE — Telephone Encounter (Signed)
Called to check up on pt.  Spoke with pt.  Pt states she is doing much better.  Nothing further needed.

## 2018-11-08 ENCOUNTER — Inpatient Hospital Stay: Payer: Medicare HMO | Admitting: Emergency Medicine

## 2018-11-08 ENCOUNTER — Ambulatory Visit: Payer: Medicare HMO | Admitting: Emergency Medicine

## 2018-11-08 ENCOUNTER — Encounter: Payer: Self-pay | Admitting: Emergency Medicine

## 2018-11-08 DIAGNOSIS — J431 Panlobular emphysema: Secondary | ICD-10-CM

## 2018-11-08 DIAGNOSIS — J9611 Chronic respiratory failure with hypoxia: Secondary | ICD-10-CM | POA: Diagnosis not present

## 2018-11-08 DIAGNOSIS — Z720 Tobacco use: Secondary | ICD-10-CM | POA: Diagnosis not present

## 2018-11-08 MED ORDER — BUPROPION HCL ER (SR) 150 MG PO TB12: 150.0000 mg | ORAL_TABLET | Freq: Two times a day (BID) | ORAL | 2 refills | Status: DC

## 2018-11-08 MED ORDER — NICOTINE 14 MG/24HR TD PT24: 14.0000 mg | MEDICATED_PATCH | Freq: Every day | TRANSDERMAL | 0 refills | Status: DC

## 2018-11-08 NOTE — Patient Instructions (Signed)
We will refill your nicotine patches 14 mg We will write a prescription for Wellbutrin for smoking cessation.  You need to work on setting a quit date, then start the medication 1 week prior to your established quit date. Her goal is to decrease ultimately stop both your cigarettes and your vaporizer Continue your Mucinex as you have been taking it. Please continue Trelegy 1 inhalation once daily.  Rinse and gargle after using. Keep both your albuterol HFA and your DuoNeb available to use as needed for shortness of breath, chest tightness, wheezing. Agree with having your oxygen concentrator evaluated by [facility] given the concerns that is not functioning properly, has low battery life.  We may need to decide going forward whether to change from a pulsed system to a continuous flow system.  We will establish whether the pulsed system is adequate when you are not back at your baseline. Please follow with B Volanda Napoleon in 1 month Follow with Dr. Lamonte Sakai in 2 months

## 2018-11-08 NOTE — Assessment & Plan Note (Signed)
Discussed strategies for cessation with her today.  She is willing to try to set a quit date and would like Wellbutrin as an adjunct therapy.  Continue her nicotine patches as well.

## 2018-11-08 NOTE — Assessment & Plan Note (Signed)
Recent exacerbation and pneumonia requiring hospitalization.  Her prednisone taper was extended at her last office visit here.  Slow improvement.  Continue your Mucinex as you have been taking it. Please continue Trelegy 1 inhalation once daily.  Rinse and gargle after using. Keep both your albuterol HFA and your DuoNeb available to use as needed for shortness of breath, chest tightness, wheezing. Please follow with B Volanda Napoleon in 1 month Follow with Dr. Lamonte Sakai in 2 months

## 2018-11-08 NOTE — Assessment & Plan Note (Signed)
Agree with having your oxygen concentrator evaluated by [facility] given the concerns that is not functioning properly, has low battery life.  We may need to decide going forward whether to change from a pulsed system to a continuous flow system.  We will establish whether the pulsed system is adequate when you are back at your baseline.

## 2018-11-08 NOTE — Progress Notes (Signed)
Subjective:    Patient ID: Gloria Calderon, female    DOB: 08/14/1966, 53 y.o.   MRN: [id number]  COPD  She complains of cough, shortness of breath and wheezing. Associated symptoms include postnasal drip. Pertinent negatives include no appetite change, ear pain, fever, headaches, rhinorrhea, sneezing, sore throat or trouble swallowing. Her past medical history is significant for COPD.   ROV 01/12/18 --53 year old smoker with a history of COPD and severe obstruction documented on prior pulmonary function testing.  I saw her earlier this month and we undertook a trial of Trelegy to see if she would benefit (as a substitute for bevespi).  We also worked on changing her to a Therapist, nutritional - she has it now, set on 3L/min pulsed. She feels better on the Trelegy, breathing better, helps her clear secretions better. She is using Duoneb about once a day now. She is doing fairly well currently. Notes that she is about to lose medicaid. She has cut her cigarettes down to 10/dayu, also using vapor. She is interested in getting her xanax increased (stopping smoking has increased her anxiety). She is planning to discuss with Dr Melina Copa in the near future when she returns from vacation.   ROV 05/25/18 --patient follows up today for severe COPD, associated hypoxemic respiratory failure, tobacco use and anxiety.  I changed her over to Trelegy last time and she benefited, less dyspnea and cough. Unfortunately she hsa been dealing w increased SOB and cough for 10 days. She has taken prednisone taper, finished 2 days ago. She is better but not back to her usual baseline. She is still having some chest tightness. She is smoking 1 pk a week. She is supposed to have spinal fusion on 9/16. No real head congestion.   ROV 08/02/18 --53 year old active smoker with severe COPD and associated hypoxemic respiratory failure.  She also uses vaporized CBD oil.  We have been managing her on Trelegy but the cost has been  prohibitive, she has not been taking it every day in order to stretch the medication out. Has been costing her around $50. She is using albuterol 1-3x a day. Uses DuoNeb about every day.   At her last visit 2 months ago she was prescribed azithromycin for an acute bronchitis. Still gets winded with housework. She is smoking 1/2 pk a day. Vapes a few times a day. No further flares since last time. No pred.   ROV 11/08/2018 --Gloria Calderon is 53, an active smoker (a few a day), active vaporized CBD oil user, with severe COPD and associated hypoxemic respiratory failure.  She was hospitalized with acute respiratory distress and apparent COPD exacerbation at [facility] earlier this month, was dx with the flu. Was treated for an AE-COPD. Entire course was complicated by an allergic rxn to isosorbide in the hospital.  She is currently managed on Trelegy. Her prednisone taper was extended, she started it on 2/20. Her exercise tolerance is low - has been using wheelchair some. She is unhappy with her POC, seems that she doesn't trigger reliably, has low battery - from [facility]. She is on Trelegy. She is using albuterol at least 1x a day.  She has DuoNeb, using prn, makes her have tremor / anxiety.    Review of Systems  Constitutional: Negative for appetite change, fever and unexpected weight change.  HENT: Positive for congestion and postnasal drip. Negative for dental problem, ear pain, nosebleeds, rhinorrhea, sinus pressure, sneezing, sore throat and trouble swallowing.   Eyes: Negative for redness  and itching.  Respiratory: Positive for cough, chest tightness, shortness of breath and wheezing.   Cardiovascular: Negative for palpitations and leg swelling.  Gastrointestinal: Negative for abdominal pain, nausea and vomiting.  Genitourinary: Negative for dysuria.  Musculoskeletal: Negative for joint swelling.  Skin: Negative for rash.  Neurological: Negative for headaches.  Hematological: Does not bruise/bleed easily.   Psychiatric/Behavioral: Negative for dysphoric mood. The patient is not nervous/anxious.        Objective:   Physical Exam Vitals:   11/08/18 0950 11/08/18 0956  BP:  116/60  Pulse:  (!) 103  SpO2:  98%  Weight: 136 lb 3.2 oz (61.8 kg)   Height: 5\' 4"  (1.626 m)    Gen: Pleasant, well-nourished, in no distress, ill appearing  ENT: No lesions,  mouth clear,  oropharynx clear, nasal congestion, some hoarse voice  Neck: No JVD, no stridor  Lungs: No use of accessory muscles, B exp wheezes  Cardiovascular: RRR, heart sounds normal, no murmur or gallops, no peripheral edema  Musculoskeletal: No deformities, no cyanosis or clubbing  Neuro: alert, non focal  Skin: Warm, no lesions or rashes      Assessment & Plan:  Tobacco abuse Discussed strategies for cessation with her today.  She is willing to try to set a quit date and would like Wellbutrin as an adjunct therapy.  Continue her nicotine patches as well.  COPD (chronic obstructive pulmonary disease) (HCC) Recent exacerbation and pneumonia requiring hospitalization.  Her prednisone taper was extended at her last office visit here.  Slow improvement.  Continue your Mucinex as you have been taking it. Please continue Trelegy 1 inhalation once daily.  Rinse and gargle after using. Keep both your albuterol HFA and your DuoNeb available to use as needed for shortness of breath, chest tightness, wheezing. Please follow with B Volanda Napoleon in 1 month Follow with Dr. Lamonte Sakai in 2 months  Chronic respiratory failure with hypoxia [facility]) Agree with having your oxygen concentrator evaluated by advanced home care given the concerns that is not functioning properly, has low battery life.  We may need to decide going forward whether to change from a pulsed system to a continuous flow system.  We will establish whether the pulsed system is adequate when you are back at your baseline.  Baltazar Apo, MD, PhD 11/08/2018, 5:41 PM [facility] [phone] or if no answer [phone]

## 2018-11-15 ENCOUNTER — Telehealth (INDEPENDENT_AMBULATORY_CARE_PROVIDER_SITE_OTHER): Payer: Self-pay | Admitting: Internal Medicine

## 2018-11-15 NOTE — Telephone Encounter (Signed)
I spoke with patient. Needs to get release from her back surgeon before we proceed with a colonoscopy. Colonoscopy will need to be done with propofol\  Gloria Calderon,  FYI: If we schedule a colonoscopy, will need to be with propofol.

## 2018-11-15 NOTE — Telephone Encounter (Signed)
Patient called regarding scheduling her colonoscopy - ph# [phone]

## 2018-11-15 NOTE — Telephone Encounter (Signed)
noted 

## 2018-11-17 ENCOUNTER — Encounter (INDEPENDENT_AMBULATORY_CARE_PROVIDER_SITE_OTHER): Payer: Self-pay | Admitting: *Deleted

## 2018-11-17 ENCOUNTER — Telehealth (INDEPENDENT_AMBULATORY_CARE_PROVIDER_SITE_OTHER): Payer: Self-pay | Admitting: Internal Medicine

## 2018-11-17 ENCOUNTER — Telehealth (INDEPENDENT_AMBULATORY_CARE_PROVIDER_SITE_OTHER): Payer: Self-pay | Admitting: *Deleted

## 2018-11-17 ENCOUNTER — Telehealth: Payer: Self-pay | Admitting: Emergency Medicine

## 2018-11-17 ENCOUNTER — Other Ambulatory Visit (INDEPENDENT_AMBULATORY_CARE_PROVIDER_SITE_OTHER): Payer: Self-pay | Admitting: Internal Medicine

## 2018-11-17 DIAGNOSIS — R195 Other fecal abnormalities: Secondary | ICD-10-CM

## 2018-11-17 MED ORDER — PEG 3350-KCL-NA BICARB-NACL 420 G PO SOLR: 4000.0000 mL | Freq: Once | ORAL | 0 refills | Status: AC

## 2018-11-17 NOTE — Telephone Encounter (Signed)
TCS sch'd 12/10/18, pre-op 3/20 at 1245, patient aware, instructions mailed

## 2018-11-17 NOTE — Telephone Encounter (Signed)
Patient needs trilyte 

## 2018-11-17 NOTE — Telephone Encounter (Signed)
Gloria Calderon, colonoscopy with propofol

## 2018-11-17 NOTE — Telephone Encounter (Signed)
Called and spoke with patient, she stated that her battery on her POC is not staying charged and she is needing to get in touch with [facility]. Advised patient of correct number to call for [facility] as she was calling the wrong one. Nothing further needed.

## 2018-12-03 ENCOUNTER — Other Ambulatory Visit: Payer: Self-pay

## 2018-12-03 ENCOUNTER — Encounter (HOSPITAL_COMMUNITY)
Admission: RE | Admit: 2018-12-03 | Discharge: 2018-12-03 | Disposition: A | Discharge status: Home / Self Care | Payer: Medicare HMO | Source: Ambulatory Visit | Attending: Internal Medicine | Admitting: Internal Medicine

## 2018-12-03 DIAGNOSIS — R195 Other fecal abnormalities: Secondary | ICD-10-CM

## 2018-12-03 NOTE — Patient Instructions (Signed)
TRAVIA ONSTAD  12/03/2018     @PREFPERIOPPHARMACY @   Your procedure is scheduled on  12/10/2018 .  Report to [facility] at  645  A.M.  Call this number if you have problems the morning of surgery:  [phone]   Remember:  Follow the diet and prep instructions given to you by Dr Olevia Perches office.                      Take these medicines the morning of surgery with A SIP OF WATER  Xanax(if needed), flexaril(if neeed), neurontin, levothyroxine. Use your nebulizer and your inhalers before you come.    Do not wear jewelry, make-up or nail polish.  Do not wear lotions, powders, or perfumes, or deodorant.  Do not shave 48 hours prior to surgery.  Men may shave face and neck.  Do not bring valuables to the hospital.  [facility] is not responsible for any belongings or valuables.  Contacts, dentures or bridgework may not be worn into surgery.  Leave your suitcase in the car.  After surgery it may be brought to your room.  For patients admitted to the hospital, discharge time will be determined by your treatment team.  Patients discharged the day of surgery will not be allowed to drive home.   Name and phone number of your driver:   family Special instructions:  None  Please read over the following fact sheets that you were given. Anesthesia Post-op Instructions and Care and Recovery After Surgery       Colonoscopy, Adult, Care After This sheet gives you information about how to care for yourself after your procedure. Your health care provider may also give you more specific instructions. If you have problems or questions, contact your health care provider. What can I expect after the procedure? After the procedure, it is common to have:  A small amount of blood in your stool for 24 hours after the procedure.  Some gas.  Mild abdominal cramping or bloating. Follow these instructions at home: General instructions  For the first 24 hours after the procedure: ?  Do not drive or use machinery. ? Do not sign important documents. ? Do not drink alcohol. ? Do your regular daily activities at a slower pace than normal. ? Eat soft, easy-to-digest foods.  Take over-the-counter or prescription medicines only as told by your health care provider. Relieving cramping and bloating   Try walking around when you have cramps or feel bloated.  Apply heat to your abdomen as told by your health care provider. Use a heat source that your health care provider recommends, such as a moist heat pack or a heating pad. ? Place a towel between your skin and the heat source. ? Leave the heat on for 20-30 minutes. ? Remove the heat if your skin turns bright red. This is especially important if you are unable to feel pain, heat, or cold. You may have a greater risk of getting burned. Eating and drinking   Drink enough fluid to keep your urine pale yellow.  Resume your normal diet as instructed by your health care provider. Avoid heavy or fried foods that are hard to digest.  Avoid drinking alcohol for as long as instructed by your health care provider. Contact a health care provider if:  You have blood in your stool 2-3 days after the procedure. Get help right away if:  You have more than a small spotting  of blood in your stool.  You pass large blood clots in your stool.  Your abdomen is swollen.  You have nausea or vomiting.  You have a fever.  You have increasing abdominal pain that is not relieved with medicine. Summary  After the procedure, it is common to have a small amount of blood in your stool. You may also have mild abdominal cramping and bloating.  For the first 24 hours after the procedure, do not drive or use machinery, sign important documents, or drink alcohol.  Contact your health care provider if you have a lot of blood in your stool, nausea or vomiting, a fever, or increased abdominal pain. This information is not intended to replace  advice given to you by your health care provider. Make sure you discuss any questions you have with your health care provider. Document Released: 04/15/2004 Document Revised: 06/24/2017 Document Reviewed: 11/13/2015 Elsevier Interactive Patient Education  2019 [place], Care After These instructions provide you with information about caring for yourself after your procedure. Your health care provider may also give you more specific instructions. Your treatment has been planned according to current medical practices, but problems sometimes occur. Call your health care provider if you have any problems or questions after your procedure. What can I expect after the procedure? After your procedure, you may:  Feel sleepy for several hours.  Feel clumsy and have poor balance for several hours.  Feel forgetful about what happened after the procedure.  Have poor judgment for several hours.  Feel nauseous or vomit.  Have a sore throat if you had a breathing tube during the procedure. Follow these instructions at home: For at least 24 hours after the procedure:      Have a responsible adult stay with you. It is important to have someone help care for you until you are awake and alert.  Rest as needed.  Do not: ? Participate in activities in which you could fall or become injured. ? Drive. ? Use heavy machinery. ? Drink alcohol. ? Take sleeping pills or medicines that cause drowsiness. ? Make important decisions or sign legal documents. ? Take care of children on your own. Eating and drinking  Follow the diet that is recommended by your health care provider.  If you vomit, drink water, juice, or soup when you can drink without vomiting.  Make sure you have little or no nausea before eating solid foods. General instructions  Take over-the-counter and prescription medicines only as told by your health care provider.  If you have sleep apnea, surgery  and certain medicines can increase your risk for breathing problems. Follow instructions from your health care provider about wearing your sleep device: ? Anytime you are sleeping, including during daytime naps. ? While taking prescription pain medicines, sleeping medicines, or medicines that make you drowsy.  If you smoke, do not smoke without supervision.  Keep all follow-up visits as told by your health care provider. This is important. Contact a health care provider if:  You keep feeling nauseous or you keep vomiting.  You feel light-headed.  You develop a rash.  You have a fever. Get help right away if:  You have trouble breathing. Summary  For several hours after your procedure, you may feel sleepy and have poor judgment.  Have a responsible adult stay with you for at least 24 hours or until you are awake and alert. This information is not intended to replace advice given to you by  your health care provider. Make sure you discuss any questions you have with your health care provider. Document Released: 12/23/2015 Document Revised: 04/17/2017 Document Reviewed: 12/23/2015 Elsevier Interactive Patient Education  2019 [employer].

## 2018-12-07 ENCOUNTER — Ambulatory Visit: Payer: Medicare HMO | Admitting: Primary Care

## 2018-12-08 ENCOUNTER — Encounter (HOSPITAL_COMMUNITY): Payer: Self-pay | Admitting: Anesthesiology

## 2018-12-10 ENCOUNTER — Encounter (HOSPITAL_COMMUNITY): Admission: RE | Payer: Self-pay | Source: Home / Self Care

## 2018-12-10 ENCOUNTER — Ambulatory Visit (HOSPITAL_COMMUNITY): Admission: RE | Admit: 2018-12-10 | Payer: Medicare HMO | Source: Home / Self Care | Admitting: Internal Medicine

## 2018-12-10 SURGERY — COLONOSCOPY WITH PROPOFOL
Anesthesia: Monitor Anesthesia Care

## 2019-01-10 ENCOUNTER — Ambulatory Visit: Payer: Medicare HMO | Admitting: Emergency Medicine

## 2019-01-17 ENCOUNTER — Telehealth: Payer: Self-pay | Admitting: Emergency Medicine

## 2019-01-17 NOTE — Telephone Encounter (Signed)
Pt is calling back [phone]

## 2019-01-17 NOTE — Telephone Encounter (Signed)
LMTCB Per 2/20 phone note patient was admitted at [facility] in [place].

## 2019-01-17 NOTE — Telephone Encounter (Signed)
Pt is requesting letter be written re: 11/03/18 hfu with E.Walsh. Pt was admitted to [facility]  and is filing a grievance against the hospital for her care. She wants a detailed letter provided by the provider stating her physical condition. She is aware provider out of office today and will return on 01/18/19.

## 2019-01-18 ENCOUNTER — Telehealth: Payer: Self-pay | Admitting: Primary Care

## 2019-01-18 NOTE — Telephone Encounter (Signed)
Please write letter/send letter with the following. Thanks   To whom it concerns:  Patient was seen in clinic on 11/03/18 for hospital follow-up for COPD exacerbation, influenza A and right lower lobe pneumonia. Complicated by reported allergy and facial swelling to isosorbide nitrate which she was given in-patient. Treated with Tamiflu, doxycycline, Cefdinir and prednisone taper. Patient continued to have shortness of breath, chest tightness and dry cough since discharge on 10/28/18. Required office nebulizer treatment, depo-medrol injection and extended prednisone taper. Patient followed-up with Dr. Lamonte Sakai as well on 11/08/18, slowly improved. If you have any further questions, please contact our office.   Sincerely, Geraldo Pitter, NP

## 2019-01-18 NOTE — Telephone Encounter (Signed)
Pt is filing lawsuit/legal action against the healtcare system in regards to her care.

## 2019-01-18 NOTE — Telephone Encounter (Signed)
Message routed to Derl Barrow, NP to review the message from patient and advise if letter can be done.  Beth, please see the message from the patient and advise. Thanks.

## 2019-01-18 NOTE — Telephone Encounter (Signed)
Pt is taking a lawsuit out on the hospital - pt states that she was still in bad shape when discharged- states that Derl Barrow was very surprised at her condition - she was in a wheelchair and gasping for air - the hospital was giving her medicine she was allergic to for 5 days - states that Eustaquio Maize stated that she may have a little bit of PTSD - Pt needs a letter of what her appearance was and her condition was - something to take to her lawyer. CB [phone]

## 2019-01-18 NOTE — Telephone Encounter (Signed)
Message typed per [facility] and sending it to pt.

## 2019-01-18 NOTE — Telephone Encounter (Signed)
Called the patient and advised her Derl Barrow, NP received her requested and the letter has been signed and mailed.   Patient voiced understanding. Nothing further needed at this time.

## 2019-01-18 NOTE — Telephone Encounter (Signed)
What is the grievance? I need some more details if possible please

## 2019-01-18 NOTE — Telephone Encounter (Signed)
Signed letter and will be mailed

## 2019-01-19 NOTE — Telephone Encounter (Signed)
Spoke with Demetrio Lapping. She stated that she had typed the letter and placed it in the mail for the patient. I called and left a detailed message for patient stating that the letter had been placed in the mail for her and if she had any additional questions to call us back.   Will close this encounter.

## 2019-02-01 ENCOUNTER — Ambulatory Visit: Payer: Medicare HMO | Admitting: Physical Therapy

## 2019-02-08 ENCOUNTER — Ambulatory Visit: Payer: Medicare HMO | Attending: Nurse Practitioner | Admitting: Physical Therapy

## 2019-02-08 ENCOUNTER — Other Ambulatory Visit: Payer: Self-pay

## 2019-02-08 DIAGNOSIS — G8929 Other chronic pain: Secondary | ICD-10-CM | POA: Diagnosis present

## 2019-02-08 DIAGNOSIS — M5442 Lumbago with sciatica, left side: Secondary | ICD-10-CM | POA: Insufficient documentation

## 2019-02-08 DIAGNOSIS — R293 Abnormal posture: Secondary | ICD-10-CM | POA: Diagnosis present

## 2019-02-08 NOTE — Therapy (Signed)
[facility] [place], [place], [address] Phone: [phone]   Fax:  [phone]  Physical Therapy Evaluation  Patient Details  Name: Gloria Calderon MRN: [id number] Date of Birth: 1966-09-04 Referring Provider (PT): Milas Kocher NP   Encounter Date: 02/08/2019  PT End of Session - 02/08/19 1205    Visit Number  1    Number of Visits  1   Patient wants to come in only one time due to a high co-pay.   Date for PT Re-Evaluation  02/08/19    PT Start Time  1118    PT Stop Time  1211    PT Time Calculation (min)  53 min    Activity Tolerance  Patient tolerated treatment well    Behavior During Therapy  WFL for tasks assessed/performed       Past Medical History:  Diagnosis Date  . Anxiety   . Arthritis   . Asthma   . Chronic back pain   . Constipation 04/02/2017  . COPD (chronic obstructive pulmonary disease) ([place])   . DDD (degenerative disc disease), lumbar   . Depression   . Dyspnea   . Fibromyalgia   . GERD (gastroesophageal reflux disease)   . H/O seasonal allergies   . Headache   . Hypothyroidism   . Osteoporosis   . Pneumonia 2008  . Ringing in ear, right   . Sciatica   . Spinal stenosis     Past Surgical History:  Procedure Laterality Date  . APPENDECTOMY    . CHOLECYSTECTOMY    . LEFT HEART CATH AND CORONARY ANGIOGRAPHY N/A 06/17/2018   Procedure: LEFT HEART CATH AND CORONARY ANGIOGRAPHY;  Surgeon: Leonie Man, MD;  Location: [facility];  Service: Cardiovascular;  Laterality: N/A;    There were no vitals filed for this visit.   Subjective Assessment - 02/08/19 1159    Subjective  COVID-19 screen performed prior to patient entering clinic.  The patient presents to the clinic today s/p L5-S1 fusion.  Due to COVID she states she was unable to attend PT earlier.  Her CC today is left sided low back pain and numbness over her left lateral thigh to foot.  Her pain-level is an 8/10  today.  Heat eases her pain.  Prolonged sitting and standing increase her pain.    Pertinent History  COPD, OP, Fibromyalgia.    How long can you sit comfortably?  20-30 minutes.    How long can you stand comfortably?  20-30 minutes.    How long can you walk comfortably?  Around home.    Patient Stated Goals  Stand straight with less pain.    Currently in Pain?  Yes    Pain Score  8     Pain Location  Back    Pain Orientation  Left    Pain Descriptors / Indicators  Aching;Sharp;Numbness    Pain Type  Surgical pain    Pain Onset  More than a month ago    Pain Frequency  Constant    Aggravating Factors   See above.    Pain Relieving Factors  See above.         [facility] PT Assessment - 02/08/19 0001      Assessment   Medical Diagnosis  Lumbar radiculopathy.    Referring Provider (PT)  Milas Kocher NP    Onset Date/Surgical Date  --   08/17/18 (surgery date).     Precautions  Precautions  --   Lumbar fusion.     Restrictions   Weight Bearing Restrictions  No      Balance Screen   Has the patient fallen in the past 6 months  No    Has the patient had a decrease in activity level because of a fear of falling?   No    Is the patient reluctant to leave their home because of a fear of falling?   No      Home Environment   Living Environment  Private residence      Prior Function   Level of Independence  Independent      Posture/Postural Control   Posture/Postural Control  Postural limitations    Postural Limitations  Rounded Shoulders;Forward head;Decreased lumbar lordosis      Deep Tendon Reflexes   DTR Assessment Site  Patella;Achilles    Patella DTR  --   LT= 2+/4+ and RT= 1+/4+.   Achilles DTR  --   Absent bilaterally.     ROM / Strength   AROM / PROM / Strength  AROM;Strength      AROM   Overall AROM Comments  The patient stands in -25 degrees of lumbar flexion and can "extend" to -15 degrees.      Strength   Overall Strength Comments  Normal bilateral  hip and knee strength.      Palpation   Palpation comment  Very tender to palpation over left SIJ.      Special Tests   Other special tests  Left LE just slightly longer then right though easily corrected with gentle left SKTC stretch.      Ambulation/Gait   Gait Comments  The patient walks in spinal flexion in obvious pain.                Objective measurements completed on examination: See above findings.      [facility] Adult PT Treatment/Exercise - 02/08/19 0001      Modalities   Modalities  Electrical Stimulation;Moist Heat      Moist Heat Therapy   Number Minutes Moist Heat  20 Minutes    Moist Heat Location  Lumbar Spine      Electrical Stimulation   Electrical Stimulation Location  Lumbar    Electrical Stimulation Action  IFC    Electrical Stimulation Parameters  80-150 Hz x 20 minutes.    Electrical Stimulation Goals  Pain                  PT Long Term Goals - 02/08/19 1232      PT LONG TERM GOAL #1   Title  Eval only and ther ex instruction per patient.             Plan - 02/08/19 1229    Clinical Impression Statement  The patient presents to [facility] s/p lumbar fusion surgery performed on 08/17/18.  She continues to have high pain-levels and c/o left sided (SIJ) low back paina nd numbness over her left lateral thigh to foot.  She was instructed in how to obtain a TENS unti and appropriate ther ex today as she cannot continue with PT due to a high co-pay.  She is -15 degrees from achieving a neutral spine posture.    Personal Factors and Comorbidities  Comorbidity 1;Comorbidity 2    Comorbidities  COPD, Fibromyalgia.    Examination-Activity Limitations  Bathing;Bed Mobility;Bend;Carry;Stand;Sit    Examination-Participation Restrictions  Community Activity    Stability/Clinical Decision Making  Stable/Uncomplicated    Clinical Decision Making  Low    Rehab Potential  Fair    PT Frequency  1x / week    PT Next Visit Plan  D/C to HEP.     Consulted and Agree with Plan of Care  Patient       Patient will benefit from skilled therapeutic intervention in order to improve the following deficits and impairments:  Abnormal gait, Pain, Decreased range of motion, Postural dysfunction  Visit Diagnosis: Chronic left-sided low back pain with left-sided sciatica  Abnormal posture     Problem List Patient Active Problem List   Diagnosis Date Noted  . Positive colorectal cancer screening using Cologuard test 11/17/2018  . Right lower lobe pneumonia ([place]) 11/03/2018  . Pre-operative cardiovascular examination 06/15/2018  . Angina, class III ([place]) 06/15/2018  . DDD (degenerative disc disease), lumbar 06/14/2018  . Vitamin D deficiency 06/14/2018  . Hypothyroidism 06/14/2018  . GERD (gastroesophageal reflux disease) 06/14/2018  . Back pain 06/14/2018  . Chronic respiratory failure with hypoxia ([place]) 01/12/2018  . Anxiety disorder 01/12/2018  . Polycythemia 07/20/2017  . Constipation 04/02/2017  . Tobacco abuse 07/29/2015  . Influenza A 10/11/2013  . COPD (chronic obstructive pulmonary disease) ([place]) 10/11/2013  . Thrush 10/11/2013  PHYSICAL THERAPY DISCHARGE SUMMARY  Visits from Start of Care: 1.  Current functional level related to goals / functional outcomes: See above.   Remaining deficits: See above.   Education / Equipment: HEP Plan: Patient agrees to discharge.  Patient goals were met. Patient is being discharged due to meeting the stated rehab goals.  ?????       , [place] MPT 02/08/2019, 12:36 PM  [facility] [address] [place], [place], [address] Phone: [phone]   Fax:  [phone]  Name: Gloria Calderon MRN: [id number] Date of Birth: January 04, 1966

## 2019-02-17 ENCOUNTER — Other Ambulatory Visit: Payer: Self-pay | Admitting: Primary Care

## 2019-02-25 ENCOUNTER — Other Ambulatory Visit: Payer: Self-pay | Admitting: Emergency Medicine

## 2019-02-25 NOTE — Telephone Encounter (Signed)
Last ov: 11/08/18 Next 03/13/28 BW Then 03/16/19 RB

## 2019-03-01 NOTE — Telephone Encounter (Signed)
Due to RB not being at office due to working at hospital due to [facility], routing to [place] who saw pt prior to [place]. Beth, please advise if you are okay refilling med for pt or if pt needs to have med refilled by PCP. Thanks!

## 2019-03-01 NOTE — Telephone Encounter (Signed)
Pt calling because she still needs to have her Wellbutrin refilled and sent to the [facility]. IF somone could give her a call back about this

## 2019-03-02 ENCOUNTER — Other Ambulatory Visit: Payer: Self-pay | Admitting: Emergency Medicine

## 2019-03-02 MED ORDER — BUPROPION HCL ER (SR) 150 MG PO TB12: 150.0000 mg | ORAL_TABLET | Freq: Two times a day (BID) | ORAL | 2 refills | Status: DC

## 2019-03-02 NOTE — Telephone Encounter (Signed)
Attempted to call pt to let her know refill was sent in for her med but unable to reach her. Left detailed message for pt letting her know the med was sent to pharmacy for her. Nothing further needed.

## 2019-03-02 NOTE — Telephone Encounter (Signed)
Pt calling requesting to have her wellbutrin refilled. Med last filled for pt by RB 11/08/2018 #60 tabs with 0 RF.  Beth, please advise if you are okay to refill med for pt or if pt needs to call PCP. Pharmacy this needs to be sent to is [facility] in [place], [place].

## 2019-03-02 NOTE — Telephone Encounter (Signed)
Yes, but encourage she be actively trying to quit smoking

## 2019-03-10 ENCOUNTER — Encounter (INDEPENDENT_AMBULATORY_CARE_PROVIDER_SITE_OTHER): Payer: Self-pay | Admitting: *Deleted

## 2019-03-10 ENCOUNTER — Other Ambulatory Visit (INDEPENDENT_AMBULATORY_CARE_PROVIDER_SITE_OTHER): Payer: Self-pay | Admitting: Internal Medicine

## 2019-03-10 DIAGNOSIS — R195 Other fecal abnormalities: Secondary | ICD-10-CM

## 2019-03-14 ENCOUNTER — Other Ambulatory Visit: Payer: Self-pay

## 2019-03-14 ENCOUNTER — Ambulatory Visit: Payer: Medicare HMO | Admitting: Primary Care

## 2019-03-14 ENCOUNTER — Encounter: Payer: Self-pay | Admitting: Primary Care

## 2019-03-14 ENCOUNTER — Ambulatory Visit: Payer: Medicare Other | Admitting: Primary Care

## 2019-03-14 VITALS — BP 112/70 | HR 100 | Temp 98.7°F | Ht 63.5 in | Wt 136.4 lb

## 2019-03-14 DIAGNOSIS — F17210 Nicotine dependence, cigarettes, uncomplicated: Secondary | ICD-10-CM | POA: Diagnosis not present

## 2019-03-14 DIAGNOSIS — J9611 Chronic respiratory failure with hypoxia: Secondary | ICD-10-CM

## 2019-03-14 DIAGNOSIS — Z720 Tobacco use: Secondary | ICD-10-CM

## 2019-03-14 MED ORDER — TRELEGY ELLIPTA 100-62.5-25 MCG/INH IN AEPB: 1.0000 | INHALATION_SPRAY | Freq: Every day | RESPIRATORY_TRACT | 6 refills | Status: DC

## 2019-03-14 NOTE — Assessment & Plan Note (Addendum)
-   Stable - Continue Trelegy 1 puff daily; prn albuterol/duoneb q 6 hours  - Continue Mucinex twice daily

## 2019-03-14 NOTE — Patient Instructions (Addendum)
Continue Trelegy 1 puff daily  Continue mucinex twice day  Continue Albuterol resuce inhaler and/or Duoneb q 6 hours for breakthrough shortness of breath  Use ____________ on exertion with portable oxygen concentrator   Continue working on smoking cessation, cut back cigarette use and pick a quit date!!!  The key is once you quit, not picking up a cigarette ever again  I KNOW you can do this :)  Follow up with Dr. Lamonte Sakai in 4-6 weeks per his last note      Steps to Quit Smoking Smoking tobacco is the leading cause of preventable death. It can affect almost every organ in the body. Smoking puts you and people around you at risk for many serious, long-lasting (chronic) diseases. Quitting smoking can be hard, but it is one of the best things that you can do for your health. It is never too late to quit. How do I get ready to quit? When you decide to quit smoking, make a plan to help you succeed. Before you quit:  Pick a date to quit. Set a date within the next 2 weeks to give you time to prepare.  Write down the reasons why you are quitting. Keep this list in places where you will see it often.  Tell your family, friends, and co-workers that you are quitting. Their support is important.  Talk with your doctor about the choices that may help you quit.  Find out if your health insurance will pay for these treatments.  Know the people, places, things, and activities that make you want to smoke (triggers). Avoid them. What first steps can I take to quit smoking?  Throw away all cigarettes at home, at work, and in your car.  Throw away the things that you use when you smoke, such as ashtrays and lighters.  Clean your car. Make sure to empty the ashtray.  Clean your home, including curtains and carpets. What can I do to help me quit smoking? Talk with your doctor about taking medicines and seeing a counselor at the same time. You are more likely to succeed when you do both.  If you  are pregnant or breastfeeding, talk with your doctor about counseling or other ways to quit smoking. Do not take medicine to help you quit smoking unless your doctor tells you to do so. To quit smoking: Quit right away  Quit smoking totally, instead of slowly cutting back on how much you smoke over a period of time.  Go to counseling. You are more likely to quit if you go to counseling sessions regularly. Take medicine You may take medicines to help you quit. Some medicines need a prescription, and some you can buy over-the-counter. Some medicines may contain a drug called nicotine to replace the nicotine in cigarettes. Medicines may:  Help you to stop having the desire to smoke (cravings).  Help to stop the problems that come when you stop smoking (withdrawal symptoms). Your doctor may ask you to use:  Nicotine patches, gum, or lozenges.  Nicotine inhalers or sprays.  Non-nicotine medicine that is taken by mouth. Find resources Find resources and other ways to help you quit smoking and remain smoke-free after you quit. These resources are most helpful when you use them often. They include:  Online chats with a Social worker.  Phone quitlines.  Printed Furniture conservator/restorer.  Support groups or group counseling.  Text messaging programs.  Mobile phone apps. Use apps on your mobile phone or tablet that can help you  stick to your quit plan. There are many free apps for mobile phones and tablets as well as websites. Examples include Quit Guide from the [employer] and smokefree.gov  What things can I do to make it easier to quit?   Talk to your family and friends. Ask them to support and encourage you.  Call a phone quitline (1-800-QUIT-NOW), reach out to support groups, or work with a Social worker.  Ask people who smoke to not smoke around you.  Avoid places that make you want to smoke, such as: ? Bars. ? Parties. ? Smoke-break areas at work.  Spend time with people who do not  smoke.  Lower the stress in your life. Stress can make you want to smoke. Try these things to help your stress: ? Getting regular exercise. ? Doing deep-breathing exercises. ? Doing yoga. ? Meditating. ? Doing a body scan. To do this, close your eyes, focus on one area of your body at a time from head to toe. Notice which parts of your body are tense. Try to relax the muscles in those areas. How will I feel when I quit smoking? Day 1 to 3 weeks Within the first 24 hours, you may start to have some problems that come from quitting tobacco. These problems are very bad 2-3 days after you quit, but they do not often last for more than 2-3 weeks. You may get these symptoms:  Mood swings.  Feeling restless, nervous, angry, or annoyed.  Trouble concentrating.  Dizziness.  Strong desire for high-sugar foods and nicotine.  Weight gain.  Trouble pooping (constipation).  Feeling like you may vomit (nausea).  Coughing or a sore throat.  Changes in how the medicines that you take for other issues work in your body.  Depression.  Trouble sleeping (insomnia). Week 3 and afterward After the first 2-3 weeks of quitting, you may start to notice more positive results, such as:  Better sense of smell and taste.  Less coughing and sore throat.  Slower heart rate.  Lower blood pressure.  Clearer skin.  Better breathing.  Fewer sick days. Quitting smoking can be hard. Do not give up if you fail the first time. Some people need to try a few times before they succeed. Do your best to stick to your quit plan, and talk with your doctor if you have any questions or concerns. Summary  Smoking tobacco is the leading cause of preventable death. Quitting smoking can be hard, but it is one of the best things that you can do for your health.  When you decide to quit smoking, make a plan to help you succeed.  Quit smoking right away, not slowly over a period of time.  When you start  quitting, seek help from your doctor, family, or friends. This information is not intended to replace advice given to you by your health care provider. Make sure you discuss any questions you have with your health care provider. Document Released: 06/28/2009 Document Revised: 11/19/2018 Document Reviewed: 11/20/2018 Elsevier Patient Education  2020 [employer].

## 2019-03-14 NOTE — Progress Notes (Signed)
@Patient  ID: Gloria Calderon, female    DOB: 12-10-1965, 53 y.o.   MRN: [id number]  Chief Complaint  Patient presents with  . Follow-up    Patient reports that her breathing is about the same since last visit. She states that she uses her nebulizer every morning when the sob is the worse.    Referring provider: Octavio Graves, DO  HPI: 53 year old, current smoker (hx of vaping). PMH severe COPD, chronic respiratory failure with hypoxia, GERD, anxiety. Patient of Dr. Lamonte Calderon, last seen November 2019. Maintained on Trelegy, Albuterol hfa and Duonebs. Smoking cessation strongly encouraged.   Called on 10/22/18 with complaints of productive cough, 102 fever and HA. Dr. Lamonte Calderon called in Doxycycline for superimposed bronchitis and instructed to be checked for the flu. She was admitted to [facility] for COPD exacerbation, right lobe pneumonia and influenza A. Completed tamiflu. Continued doxycyline course and was started on Cefdinir and prednisone taper.   Previous [place] Encounters: 11/03/2018 Patient presents today for hospital follow-up. Accompanied by her husband today. Continues to have sob, chest tightness and dry cough since discharge on 10/28/18. Reports chest congestion, not coughing up the much.  She has one day left of antibiotics and prednisone taper. Need refill duoneb and albuterol hfa. Has flutter valve at home but has not used it. States that she had a bad experience during her hospitalization. Her issue was that she was given isosorbide nitrate for 3 days and reports allergy and facial swelling reaction. Afebrile today.   ROV 11/08/2018 --Gloria Calderon is 53, an active smoker (a few a day), active vaporized CBD oil user, with severe COPD and associated hypoxemic respiratory failure.  She was hospitalized with acute respiratory distress and apparent COPD exacerbation at [facility] earlier this month, was dx with the flu. Was treated for an AE-COPD. Entire course was  complicated by an allergic rxn to isosorbide in the hospital.  She is currently managed on Trelegy. Her prednisone taper was extended, she started it on 2/20. Her exercise tolerance is low - has been using wheelchair some. She is unhappy with her POC, seems that she doesn't trigger reliably, has low battery - from [facility]. She is on Trelegy. She is using albuterol at least 1x a day.  She has DuoNeb, using prn, makes her have tremor / anxiety.   03/14/2019 Patient presents today for 1 month follow-up visit. Breathing is baseline. Symptoms worse in the morning. Continue Trelegy 1 puff daily as prescribed. Using nebulizer every morning. Feels embarrassed that she is still smoking 1/2 pack daily. She is using wellbutrin and nicotine patches. Lives with two smokers, her mom has mentioned wanting to quit too. States that she has issues with that battery on her POC not working.   Allergies  Allergen Reactions  . Isosorbide Nitrate Swelling  . Isosorbide Nausea And Vomiting and Other (See Comments)    Severe headache  . Levaquin [Levofloxacin] Hives  . Morphine And Related Itching and Nausea And Vomiting    Immunization History  Administered Date(s) Administered  . Influenza Split 08/15/2013  . Influenza,inj,Quad PF,6+ Mos 05/25/2014, 07/22/2017, 07/08/2018  . Pneumococcal Polysaccharide-23 07/30/2015, 07/22/2017    Past Medical History:  Diagnosis Date  . Anxiety   . Arthritis   . Asthma   . Chronic back pain   . Constipation 04/02/2017  . COPD (chronic obstructive pulmonary disease) ([place])   . DDD (degenerative disc disease), lumbar   . Depression   . Dyspnea   .  Fibromyalgia   . GERD (gastroesophageal reflux disease)   . H/O seasonal allergies   . Headache   . Hypothyroidism   . Osteoporosis   . Pneumonia 2008  . Ringing in ear, right   . Sciatica   . Spinal stenosis     Tobacco History: Social History   Tobacco Use  Smoking Status Current Every Day Smoker  . Packs/day: 0.50  .  Years: 30.00  . Pack years: 15.00  . Types: Cigarettes, E-cigarettes  Smokeless Tobacco Never Used   Ready to quit: Yes Counseling given: Not Answered   Outpatient Medications Prior to Visit  Medication Sig Dispense Refill  . acetaminophen (TYLENOL) 650 MG CR tablet Take 1,300 mg by mouth 2 (two) times daily.     Marland Kitchen albuterol (VENTOLIN HFA) 108 (90 Base) MCG/ACT inhaler 2 PUFFS EVERY 6 HOURS AS NEEDED FOR WHEEZING OR SHORTNESS OF BREATH 18 g 1  . ALPRAZolam (XANAX) 1 MG tablet Take 1 tablet (1 mg total) by mouth 4 (four) times daily as needed for anxiety. 120 tablet 0  . bisacodyl (DULCOLAX) 5 MG EC tablet Take 5 mg by mouth daily as needed for moderate constipation.     Marland Kitchen buPROPion (WELLBUTRIN SR) 150 MG 12 hr tablet Take 1 tablet (150 mg total) by mouth 2 (two) times daily. 60 tablet 2  . Carboxymethylcellulose Sod PF (REFRESH PLUS) 0.5 % SOLN Place 1 drop into both eyes 3 (three) times daily.     . cyclobenzaprine (FLEXERIL) 10 MG tablet Take 30 mg by mouth 3 (three) times daily.     Marland Kitchen esomeprazole (NEXIUM) 40 MG capsule Take 40 mg by mouth daily at 12 noon.    . fluticasone (FLONASE) 50 MCG/ACT nasal spray Place 2 sprays into both nostrils daily.    Marland Kitchen ipratropium-albuterol (DUONEB) 0.5-2.5 (3) MG/3ML SOLN Take 3 mLs by nebulization every 6 (six) hours as needed (shortness of breath). (Patient taking differently: Take 3 mLs by nebulization every 4 (four) hours as needed (shortness of breath). ) 360 mL 1  . levothyroxine (SYNTHROID, LEVOTHROID) 25 MCG tablet Take 25 mcg by mouth daily before breakfast.   11  . magic mouthwash w/lidocaine SOLN Take 5 mLs by mouth 3 (three) times daily as needed for mouth pain. Swish and spit, do not swallow 100 mL 0  . nicotine (NICODERM CQ - DOSED IN MG/24 HOURS) 14 mg/24hr patch Place 1 patch (14 mg total) onto the skin daily. (Patient taking differently: Place 14 mg onto the skin daily as needed (smoke cessation). ) 28 patch 0  . NON FORMULARY Take 1,500  mg by mouth 3 (three) times a week. CBD Oil    . oxyCODONE (ROXICODONE) 15 MG immediate release tablet Take 15 mg by mouth every 6 (six) hours as needed for pain.     . OXYGEN Inhale 2.5 L into the lungs continuous.    . pregabalin (LYRICA) 75 MG capsule Take 1 capsule by mouth at bedtime.    . Probiotic Product (ALIGN) 4 MG CAPS Take 8 mg by mouth at bedtime.     . sodium chloride (OCEAN) 0.65 % SOLN nasal spray Place 2 sprays into both nostrils every evening.     . Teriparatide, Recombinant, (FORTEO) 600 MCG/2.4ML SOLN Inject 20 mcg into the skin daily.    . Fluticasone-Umeclidin-Vilant (TRELEGY ELLIPTA) 100-62.5-25 MCG/INH AEPB Inhale 1 puff into the lungs daily. 1 each 0  . doxycycline (VIBRA-TABS) 100 MG tablet Take 1 tablet (100 mg total) by mouth  2 (two) times daily. (Patient not taking: Reported on 11/26/2018) 14 tablet 0  . gabapentin (NEURONTIN) 300 MG capsule Take 600 mg by mouth at bedtime.     . predniSONE (DELTASONE) 10 MG tablet Starting 11/04/18 take 5 tabs po daily x 5 days; 4 tabs x 2 days; then 3 tabs x 2 days; then 2 tabs x 2 days; then 1 tab x 2 days (Patient not taking: Reported on 11/26/2018) 45 tablet 0   No facility-administered medications prior to visit.     Review of Systems  Review of Systems  Constitutional: Negative.   HENT: Negative.   Respiratory: Positive for shortness of breath. Negative for cough and wheezing.   Cardiovascular: Negative.   Psychiatric/Behavioral: The patient is nervous/anxious.    Physical Exam  BP 112/70 (BP Location: Left Arm, Cuff Size: Normal)   Pulse 100   Temp 98.7 F (37.1 C) (Oral)   Ht 5' 3.5" (1.613 m)   Wt 136 lb 6.4 oz (61.9 kg)   SpO2 98%   BMI 23.78 kg/m  Physical Exam Constitutional:      Appearance: Normal appearance.  HENT:     Head: Normocephalic and atraumatic.     Right Ear: Tympanic membrane normal.     Left Ear: Tympanic membrane normal.     Mouth/Throat:     Mouth: Mucous membranes are moist.      Pharynx: Oropharynx is clear.  Neck:     Musculoskeletal: Normal range of motion and neck supple.  Cardiovascular:     Rate and Rhythm: Normal rate and regular rhythm.  Pulmonary:     Effort: Pulmonary effort is normal.     Breath sounds: Normal breath sounds. No wheezing.  Skin:    General: Skin is warm and dry.  Neurological:     General: No focal deficit present.     Mental Status: She is alert and oriented to person, place, and time. Mental status is at baseline.  Psychiatric:        Thought Content: Thought content normal.        Judgment: Judgment normal.     Comments: Anxious      Lab Results:  CBC    Component Value Date/Time   WBC 7.0 07/12/2018 1146   RBC 4.80 07/12/2018 1146   HGB 15.4 (H) 07/12/2018 1146   HCT 48.3 (H) 07/12/2018 1146   PLT 293 07/12/2018 1146   MCV 100.6 (H) 07/12/2018 1146   MCH 32.1 07/12/2018 1146   MCHC 31.9 07/12/2018 1146   RDW 13.2 07/12/2018 1146   LYMPHSABS 3.7 05/24/2018 1121   MONOABS 1.0 05/24/2018 1121   EOSABS 0.1 05/24/2018 1121   BASOSABS 0.1 05/24/2018 1121    BMET    Component Value Date/Time   NA 142 07/12/2018 1146   K 4.0 07/12/2018 1146   CL 107 07/12/2018 1146   CO2 29 07/12/2018 1146   GLUCOSE 91 07/12/2018 1146   BUN 15 07/12/2018 1146   CREATININE 0.70 07/12/2018 1146   CALCIUM 8.9 07/12/2018 1146   GFRNONAA >60 07/12/2018 1146   GFRAA >60 07/12/2018 1146    BNP No results found for: BNP  ProBNP No results found for: PROBNP  Imaging: No results found.   Assessment & Plan:   COPD (chronic obstructive pulmonary disease) (H[facility]) - Stable - Continue Trelegy 1 puff daily; prn albuterol/duoneb q 6 hours  - Continue Mucinex twice daily  Chronic respiratory failure with hypoxia ([facility]) - Ambulatory walk O2 low 92% on  3L pulsed  - Needs 3L on exertion while using POC  Tobacco abuse - Current tobacco use, 1/2 pack daily  - Seems motivated to quit smoking  - Continue Wellbutrin 300mg  daily and  nicotine patches - Long discussion about smoking cessation > 7 mins     Martyn Ehrich, NP 03/14/2019

## 2019-03-14 NOTE — Assessment & Plan Note (Addendum)
-   Current tobacco use, 1/2 pack daily  - Seems motivated to quit smoking  - Continue Wellbutrin 300mg  daily and nicotine patches - Long discussion about smoking cessation > 7 mins

## 2019-03-14 NOTE — Assessment & Plan Note (Addendum)
-   Ambulatory walk O2 low 92% on 3L pulsed  - Needs 3L on exertion while using POC

## 2019-03-14 NOTE — Addendum Note (Signed)
Addended by: Hildred Alamin I on: 03/14/2019 02:50 PM   Modules accepted: Orders

## 2019-03-16 ENCOUNTER — Ambulatory Visit: Payer: Medicare HMO | Admitting: Emergency Medicine

## 2019-03-22 ENCOUNTER — Other Ambulatory Visit (HOSPITAL_COMMUNITY): Payer: Medicare Other

## 2019-03-22 ENCOUNTER — Encounter (HOSPITAL_COMMUNITY)
Admission: RE | Admit: 2019-03-22 | Discharge: 2019-03-22 | Disposition: A | Discharge status: Home / Self Care | Payer: Medicare HMO | Source: Ambulatory Visit | Attending: Internal Medicine | Admitting: Internal Medicine

## 2019-03-22 ENCOUNTER — Other Ambulatory Visit (HOSPITAL_COMMUNITY)
Admission: RE | Admit: 2019-03-22 | Discharge: 2019-03-22 | Disposition: A | Discharge status: Home / Self Care | Payer: Medicare HMO | Source: Ambulatory Visit | Attending: Internal Medicine | Admitting: Internal Medicine

## 2019-03-22 ENCOUNTER — Other Ambulatory Visit: Payer: Self-pay

## 2019-03-22 DIAGNOSIS — Z01812 Encounter for preprocedural laboratory examination: Secondary | ICD-10-CM | POA: Insufficient documentation

## 2019-03-22 DIAGNOSIS — Z1159 Encounter for screening for other viral diseases: Secondary | ICD-10-CM | POA: Insufficient documentation

## 2019-03-22 LAB — SARS CORONAVIRUS 2 (TAT 6-24 HRS): SARS Coronavirus 2: NEGATIVE

## 2019-03-23 ENCOUNTER — Telehealth: Payer: Self-pay | Admitting: Primary Care

## 2019-03-23 NOTE — Telephone Encounter (Signed)
Called and spoke with Patient. Patient stated she was told at last OV with Eustaquio Maize, NP, a new POC order was being placed.  Patient stated she could not get in touch with her DME. Called  [facility], spoke with Juanda Crumble, explained Patient has a POC through [facility] and it has stopped working.  Juanda Crumble stated to have Patient call and they could send a replacement POC if needed. Called and spoke with Patient.  [facility] number given to Patient to have POC repaired or replaced. Nothing further at this time.

## 2019-03-23 NOTE — Telephone Encounter (Signed)
LMTCB x1 for pt.  

## 2019-03-23 NOTE — Telephone Encounter (Signed)
Pt returning phone call she was on the phone when you called her earlier. She will be waiting on your call back

## 2019-03-24 ENCOUNTER — Telehealth: Payer: Self-pay | Admitting: Emergency Medicine

## 2019-03-24 NOTE — Telephone Encounter (Signed)
Spoke with patient. She stated that she was given the number to [facility] yesterday for her O2. She called and she spoke with someone (but couldn't remember the name of the person) and they stated that they would call her back. She hasn't heard anything since then.   Advised her that I would send a community message to our person at [facility] who handles the O2 orders and that as soon as I heard anything back, I would call her. She verbalized understanding.   Community message sent to [place]. Will keep this encounter open for follow up.

## 2019-03-24 NOTE — Telephone Encounter (Signed)
Catron, Neysa Hotter, [place]; [place], Stanford Breed, [place]; Renee Ramus, thank you so much for reaching out to [place]. We are showing we did receive the order and we have called the patient earlier today at 11:25 and left a voicemail on her home phone of [phone], and we called her cell phone of [phone] and there was no answer and no way to leave a message. Our delivery department has it set to followup on again tomorrow.    Received the following message from [place].   Advised patient that [facility] will try again tomorrow to reach out to her. She verbalized understanding.   Nothing further needed at time of call.

## 2019-03-25 ENCOUNTER — Ambulatory Visit (HOSPITAL_COMMUNITY): Payer: Medicare HMO | Admitting: Anesthesiology

## 2019-03-25 ENCOUNTER — Encounter (HOSPITAL_COMMUNITY)
Admission: RE | Disposition: A | Discharge status: Home / Self Care | Payer: Self-pay | Source: Home / Self Care | Attending: Internal Medicine

## 2019-03-25 ENCOUNTER — Ambulatory Visit (HOSPITAL_COMMUNITY)
Admission: RE | Admit: 2019-03-25 | Discharge: 2019-03-25 | Disposition: A | Discharge status: Home / Self Care | Payer: Medicare HMO | Attending: Internal Medicine | Admitting: Internal Medicine

## 2019-03-25 ENCOUNTER — Other Ambulatory Visit: Payer: Self-pay

## 2019-03-25 ENCOUNTER — Encounter (HOSPITAL_COMMUNITY): Payer: Self-pay | Admitting: *Deleted

## 2019-03-25 DIAGNOSIS — E039 Hypothyroidism, unspecified: Secondary | ICD-10-CM | POA: Insufficient documentation

## 2019-03-25 DIAGNOSIS — F329 Major depressive disorder, single episode, unspecified: Secondary | ICD-10-CM | POA: Diagnosis not present

## 2019-03-25 DIAGNOSIS — K644 Residual hemorrhoidal skin tags: Secondary | ICD-10-CM | POA: Insufficient documentation

## 2019-03-25 DIAGNOSIS — F1120 Opioid dependence, uncomplicated: Secondary | ICD-10-CM | POA: Insufficient documentation

## 2019-03-25 DIAGNOSIS — D123 Benign neoplasm of transverse colon: Secondary | ICD-10-CM | POA: Diagnosis not present

## 2019-03-25 DIAGNOSIS — Z79890 Hormone replacement therapy (postmenopausal): Secondary | ICD-10-CM | POA: Insufficient documentation

## 2019-03-25 DIAGNOSIS — K219 Gastro-esophageal reflux disease without esophagitis: Secondary | ICD-10-CM | POA: Insufficient documentation

## 2019-03-25 DIAGNOSIS — D125 Benign neoplasm of sigmoid colon: Secondary | ICD-10-CM | POA: Insufficient documentation

## 2019-03-25 DIAGNOSIS — F419 Anxiety disorder, unspecified: Secondary | ICD-10-CM | POA: Insufficient documentation

## 2019-03-25 DIAGNOSIS — Z79899 Other long term (current) drug therapy: Secondary | ICD-10-CM | POA: Insufficient documentation

## 2019-03-25 DIAGNOSIS — Z9981 Dependence on supplemental oxygen: Secondary | ICD-10-CM | POA: Diagnosis not present

## 2019-03-25 DIAGNOSIS — F17210 Nicotine dependence, cigarettes, uncomplicated: Secondary | ICD-10-CM | POA: Insufficient documentation

## 2019-03-25 DIAGNOSIS — J449 Chronic obstructive pulmonary disease, unspecified: Secondary | ICD-10-CM | POA: Insufficient documentation

## 2019-03-25 DIAGNOSIS — M797 Fibromyalgia: Secondary | ICD-10-CM | POA: Diagnosis not present

## 2019-03-25 DIAGNOSIS — J302 Other seasonal allergic rhinitis: Secondary | ICD-10-CM | POA: Diagnosis not present

## 2019-03-25 DIAGNOSIS — R195 Other fecal abnormalities: Secondary | ICD-10-CM

## 2019-03-25 DIAGNOSIS — Z7951 Long term (current) use of inhaled steroids: Secondary | ICD-10-CM | POA: Insufficient documentation

## 2019-03-25 DIAGNOSIS — K5909 Other constipation: Secondary | ICD-10-CM | POA: Insufficient documentation

## 2019-03-25 HISTORY — PX: POLYPECTOMY: SHX5525

## 2019-03-25 HISTORY — PX: COLONOSCOPY WITH PROPOFOL: SHX5780

## 2019-03-25 SURGERY — COLONOSCOPY WITH PROPOFOL
Anesthesia: General

## 2019-03-25 MED ORDER — CHLORHEXIDINE GLUCONATE CLOTH 2 % EX PADS: 6.0000 | MEDICATED_PAD | Freq: Once | CUTANEOUS | Status: DC

## 2019-03-25 MED ORDER — KETAMINE HCL 10 MG/ML IJ SOLN
INTRAMUSCULAR | Status: DC | PRN
  Administered 2019-03-25: 5 mg via INTRAVENOUS
  Administered 2019-03-25: 10 mg via INTRAVENOUS

## 2019-03-25 MED ORDER — HYDROMORPHONE HCL 1 MG/ML IJ SOLN: 0.2500 mg | INTRAMUSCULAR | Status: DC | PRN

## 2019-03-25 MED ORDER — KETAMINE HCL 50 MG/5ML IJ SOSY
PREFILLED_SYRINGE | INTRAMUSCULAR | Status: AC
  Filled 2019-03-25: qty 5

## 2019-03-25 MED ORDER — PROMETHAZINE HCL 25 MG/ML IJ SOLN: 6.2500 mg | INTRAMUSCULAR | Status: DC | PRN

## 2019-03-25 MED ORDER — PROPOFOL 10 MG/ML IV BOLUS
INTRAVENOUS | Status: AC
  Filled 2019-03-25: qty 40

## 2019-03-25 MED ORDER — PROPOFOL 500 MG/50ML IV EMUL
INTRAVENOUS | Status: DC | PRN
  Administered 2019-03-25: 150 ug/kg/min via INTRAVENOUS

## 2019-03-25 MED ORDER — PROPOFOL 10 MG/ML IV BOLUS
INTRAVENOUS | Status: DC | PRN
  Administered 2019-03-25 (×2): 20 mg via INTRAVENOUS

## 2019-03-25 MED ORDER — MIDAZOLAM HCL 2 MG/2ML IJ SOLN: 0.5000 mg | Freq: Once | INTRAMUSCULAR | Status: DC | PRN

## 2019-03-25 MED ORDER — LACTATED RINGERS IV SOLN
INTRAVENOUS | Status: DC
  Administered 2019-03-25: 08:00:00 via INTRAVENOUS

## 2019-03-25 NOTE — Transfer of Care (Signed)
Immediate Anesthesia Transfer of Care Note  Patient: Gloria Calderon  Procedure(s) Performed: COLONOSCOPY WITH PROPOFOL (N/A ) POLYPECTOMY  Patient Location: PACU  Anesthesia Type:General  Level of Consciousness: awake, alert  and oriented  Airway & Oxygen Therapy: Patient Spontanous Breathing  Post-op Assessment: Report given to RN  Post vital signs: Reviewed and stable  Last Vitals:  Vitals Value Taken Time  BP    Temp    Pulse    Resp    SpO2      Last Pain:  Vitals:   03/25/19 0920  TempSrc:   PainSc: 0-No pain      Patients Stated Pain Goal: 7 (07/05/10 7356)  Complications: No apparent anesthesia complications

## 2019-03-25 NOTE — Op Note (Signed)
[facility] Patient Name: Gloria Calderon Procedure Date: 03/25/2019 8:35 AM MRN: [id number] Date of Birth: 1965/11/06 Attending MD: Hildred Laser , MD CSN: [id number] Age: 53 Admit Type: Outpatient Procedure:                Colonoscopy Indications:              Positive Cologuard test Providers:                Hildred Laser, MD, Charlsie Quest. Theda Sers RN, RN,                            Raphael Gibney, Technician Referring MD:             Lavon Paganini Medicines:                Propofol per Anesthesia Complications:            No immediate complications. Estimated Blood Loss:     Estimated blood loss was minimal. Procedure:                Pre-Anesthesia Assessment:                           - Prior to the procedure, a History and Physical                            was performed, and patient medications and                            allergies were reviewed. The patient's tolerance of                            previous anesthesia was also reviewed. The risks                            and benefits of the procedure and the sedation                            options and risks were discussed with the patient.                            All questions were answered, and informed consent                            was obtained. Prior Anticoagulants: The patient has                            taken no previous anticoagulant or antiplatelet                            agents except for aspirin. ASA Grade Assessment: IV                            - A patient with severe systemic disease that is a  constant threat to life. After reviewing the risks                            and benefits, the patient was deemed in                            satisfactory condition to undergo the procedure.                           After obtaining informed consent, the colonoscope                            was passed under direct vision. Throughout the                             procedure, the patient's blood pressure, pulse, and                            oxygen saturations were monitored continuously. The                            [id number]-H190DL ([id number]) scope was introduced through                            the anus and advanced to the the cecum, identified                            by appendiceal orifice and ileocecal valve. The                            colonoscopy was performed without difficulty. The                            patient tolerated the procedure well. The quality                            of the bowel preparation was adequate. The                            ileocecal valve, appendiceal orifice, and rectum                            were photographed. Scope In: 9:23:50 AM Scope Out: 9:50:04 AM Scope Withdrawal Time: 0 hours 18 minutes 15 seconds  Total Procedure Duration: 0 hours 26 minutes 14 seconds  Findings:      The perianal and digital rectal examinations were normal.      A 7 mm polyp was found in the hepatic flexure. The polyp was sessile.       The polyp was removed with a cold snare. Polyp resection was incomplete.       The resected tissue was retrieved. The pathology specimen was placed       into Bottle Number 1.      A 5 mm polyp was found in the distal sigmoid colon. The polyp was  sessile. The polyp was removed with a cold snare. Resection and       retrieval were complete. The pathology specimen was placed into Bottle       Number 2.      A small polyp was found in the distal sigmoid colon. The polyp was       sessile. Biopsies were taken with a cold forceps for histology. The       pathology specimen was placed into Bottle Number 2.      External hemorrhoids were found during retroflexion. The hemorrhoids       were small. Impression:               - One 7 mm polyp at the hepatic flexure, removed                            with a cold snare. Incomplete resection. Resected                            tissue  retrieved.                           - One 5 mm polyp in the distal sigmoid colon,                            removed with a cold snare. Resected and retrieved.                           - One small polyp in the distal sigmoid colon.                            Biopsied.                           - External hemorrhoids. Moderate Sedation:      Per Anesthesia Care Recommendation:           - Patient has a contact number available for                            emergencies. The signs and symptoms of potential                            delayed complications were discussed with the                            patient. Return to normal activities tomorrow.                            Written discharge instructions were provided to the                            patient.                           - High fiber diet today.                           -  Continue present medications.                           - No aspirin, ibuprofen, naproxen, or other                            non-steroidal anti-inflammatory drugs for 3 days.                           - Await pathology results.                           - Repeat colonoscopy is recommended. The                            colonoscopy date will be determined after pathology                            results from today's exam become available for                            review. Procedure Code(s):        --- Professional ---                           [phone], Colonoscopy, flexible; with removal of                            tumor(s), polyp(s), or other lesion(s) by snare                            technique                           45380, 59, Colonoscopy, flexible; with biopsy,                            single or multiple Diagnosis Code(s):        --- Professional ---                           K63.5, Polyp of colon                           K64.4, Residual hemorrhoidal skin tags                           R19.5, Other fecal abnormalities CPT  copyright 2019 American Medical Association. All rights reserved. The codes documented in this report are preliminary and upon coder review may  be revised to meet current compliance requirements. Hildred Laser, MD Hildred Laser, MD 03/25/2019 9:59:49 AM This report has been signed electronically. Number of Addenda: 0

## 2019-03-25 NOTE — Discharge Instructions (Signed)
No aspirin or NSAIDs for 3 days. Can take Tylenol up to 2 g/day in divided dose on as-needed basis. Resume usual medications as before. High-fiber diet. No driving for 24 hours. Physician will call with biopsy results.       Colonoscopy, Adult, Care After This sheet gives you information about how to care for yourself after your procedure. Your doctor may also give you more specific instructions. If you have problems or questions, call your doctor. What can I expect after the procedure? After the procedure, it is common to have:  A small amount of blood in your poop for 24 hours.  Some gas.  Mild cramping or bloating in your belly. Follow these instructions at home: General instructions  For the first 24 hours after the procedure: ? Do not drive or use machinery. ? Do not sign important documents. ? Do not drink alcohol. ? Do your daily activities more slowly than normal. ? Eat foods that are soft and easy to digest.  Take over-the-counter or prescription medicines only as told by your doctor. To help cramping and bloating:   Try walking around.  Put heat on your belly (abdomen) as told by your doctor. Use a heat source that your doctor recommends, such as a moist heat pack or a heating pad. ? Put a towel between your skin and the heat source. ? Leave the heat on for 20-30 minutes. ? Remove the heat if your skin turns bright red. This is especially important if you cannot feel pain, heat, or cold. You can get burned. Eating and drinking   Drink enough fluid to keep your pee (urine) clear or pale yellow.  Return to your normal diet as told by your doctor. Avoid heavy or fried foods that are hard to digest.  Avoid drinking alcohol for as long as told by your doctor. Contact a doctor if:  You have blood in your poop (stool) 2-3 days after the procedure. Get help right away if:  You have more than a small amount of blood in your poop.  You see large clumps of  tissue (blood clots) in your poop.  Your belly is swollen.  You feel sick to your stomach (nauseous).  You throw up (vomit).  You have a fever.  You have belly pain that gets worse, and medicine does not help your pain. Summary  After the procedure, it is common to have a small amount of blood in your poop. You may also have mild cramping and bloating in your belly.  For the first 24 hours after the procedure, do not drive or use machinery, do not sign important documents, and do not drink alcohol.  Get help right away if you have a lot of blood in your poop, feel sick to your stomach, have a fever, or have more belly pain. This information is not intended to replace advice given to you by your health care provider. Make sure you discuss any questions you have with your health care provider. Document Released: 10/04/2010 Document Revised: 07/02/2017 Document Reviewed: 05/26/2016 Elsevier Patient Education  [place].      High-Fiber Diet Fiber, also called dietary fiber, is a type of carbohydrate that is found in fruits, vegetables, whole grains, and beans. A high-fiber diet can have many health benefits. Your health care provider may recommend a high-fiber diet to help:  Prevent constipation. Fiber can make your bowel movements more regular.  Lower your cholesterol.  Relieve the following conditions: ? Swelling of  veins in the anus (hemorrhoids). ? Swelling and irritation (inflammation) of specific areas of the digestive tract (uncomplicated diverticulosis). ? A problem of the large intestine (colon) that sometimes causes pain and diarrhea (irritable bowel syndrome, IBS).  Prevent overeating as part of a weight-loss plan.  Prevent heart disease, type 2 diabetes, and certain cancers. What is my plan? The recommended daily fiber intake in grams (g) includes:  38 g for men age 71 or younger.  30 g for men over age 31.  28 g for women age 62 or younger.  21 g  for women over age 26. You can get the recommended daily intake of dietary fiber by:  Eating a variety of fruits, vegetables, grains, and beans.  Taking a fiber supplement, if it is not possible to get enough fiber through your diet. What do I need to know about a high-fiber diet?  It is better to get fiber through food sources rather than from fiber supplements. There is not a lot of research about how effective supplements are.  Always check the fiber content on the nutrition facts label of any prepackaged food. Look for foods that contain 5 g of fiber or more per serving.  Talk with a diet and nutrition specialist (dietitian) if you have questions about specific foods that are recommended or not recommended for your medical condition, especially if those foods are not listed below.  Gradually increase how much fiber you consume. If you increase your intake of dietary fiber too quickly, you may have bloating, cramping, or gas.  Drink plenty of water. Water helps you to digest fiber. What are tips for following this plan?  Eat a wide variety of high-fiber foods.  Make sure that half of the grains that you eat each day are whole grains.  Eat breads and cereals that are made with whole-grain flour instead of refined flour or white flour.  Eat brown rice, bulgur wheat, or millet instead of white rice.  Start the day with a breakfast that is high in fiber, such as a cereal that contains 5 g of fiber or more per serving.  Use beans in place of meat in soups, salads, and pasta dishes.  Eat high-fiber snacks, such as berries, raw vegetables, nuts, and popcorn.  Choose whole fruits and vegetables instead of processed forms like juice or sauce. What foods can I eat?  Fruits Berries. Pears. Apples. Oranges. Avocado. Prunes and raisins. Dried figs. Vegetables Sweet potatoes. Spinach. Kale. Artichokes. Cabbage. Broccoli. Cauliflower. Green peas. Carrots. Squash. Grains Whole-grain  breads. Multigrain cereal. Oats and oatmeal. Brown rice. Barley. Bulgur wheat. [place]. Quinoa. Bran muffins. Popcorn. Rye wafer crackers. Meats and other proteins Navy, kidney, and pinto beans. Soybeans. Split peas. Lentils. Nuts and seeds. Dairy Fiber-fortified yogurt. Beverages Fiber-fortified soy milk. Fiber-fortified orange juice. Other foods Fiber bars. The items listed above may not be a complete list of recommended foods and beverages. Contact a dietitian for more options. What foods are not recommended? Fruits Fruit juice. Cooked, strained fruit. Vegetables Fried potatoes. Canned vegetables. Well-cooked vegetables. Grains White bread. Pasta made with refined flour. White rice. Meats and other proteins Fatty cuts of meat. Fried chicken or fried fish. Dairy Milk. Yogurt. Cream cheese. Sour cream. Fats and oils Butters. Beverages Soft drinks. Other foods Cakes and pastries. The items listed above may not be a complete list of foods and beverages to avoid. Contact a dietitian for more information. Summary  Fiber is a type of carbohydrate. It is found in  fruits, vegetables, whole grains, and beans.  There are many health benefits of eating a high-fiber diet, such as preventing constipation, lowering blood cholesterol, helping with weight loss, and reducing your risk of heart disease, diabetes, and certain cancers.  Gradually increase your intake of fiber. Increasing too fast can result in cramping, bloating, and gas. Drink plenty of water while you increase your fiber.  The best sources of fiber include whole fruits and vegetables, whole grains, nuts, seeds, and beans. This information is not intended to replace advice given to you by your health care provider. Make sure you discuss any questions you have with your health care provider. Document Released: 09/01/2005 Document Revised: 07/06/2017 Document Reviewed: 07/06/2017 Elsevier Patient Education  2020 [place] After These instructions provide you with information about caring for yourself after your procedure. Your health care provider may also give you more specific instructions. Your treatment has been planned according to current medical practices, but problems sometimes occur. Call your health care provider if you have any problems or questions after your procedure. What can I expect after the procedure? After your procedure, you may:  Feel sleepy for several hours.  Feel clumsy and have poor balance for several hours.  Feel forgetful about what happened after the procedure.  Have poor judgment for several hours.  Feel nauseous or vomit.  Have a sore throat if you had a breathing tube during the procedure. Follow these instructions at home: For at least 24 hours after the procedure:      Have a responsible adult stay with you. It is important to have someone help care for you until you are awake and alert.  Rest as needed.  Do not: ? Participate in activities in which you could fall or become injured. ? Drive. ? Use heavy machinery. ? Drink alcohol. ? Take sleeping pills or medicines that cause drowsiness. ? Make important decisions or sign legal documents. ? Take care of children on your own. Eating and drinking  Follow the diet that is recommended by your health care provider.  If you vomit, drink water, juice, or soup when you can drink without vomiting.  Make sure you have little or no nausea before eating solid foods. General instructions  Take over-the-counter and prescription medicines only as told by your health care provider.  If you have sleep apnea, surgery and certain medicines can increase your risk for breathing problems. Follow instructions from your health care provider about wearing your sleep device: ? Anytime you are sleeping, including during daytime naps. ? While taking prescription pain medicines, sleeping  medicines, or medicines that make you drowsy.  If you smoke, do not smoke without supervision.  Keep all follow-up visits as told by your health care provider. This is important. Contact a health care provider if:  You keep feeling nauseous or you keep vomiting.  You feel light-headed.  You develop a rash.  You have a fever. Get help right away if:  You have trouble breathing. Summary  For several hours after your procedure, you may feel sleepy and have poor judgment.  Have a responsible adult stay with you for at least 24 hours or until you are awake and alert. This information is not intended to replace advice given to you by your health care provider. Make sure you discuss any questions you have with your health care provider. Document Released: 12/23/2015 Document Revised: 11/30/2017 Document Reviewed: 12/23/2015 Elsevier Patient Education  2020  Elsevier Inc. ° °

## 2019-03-25 NOTE — H&P (Signed)
Gloria Calderon is an 53 y.o. female.   Chief Complaint: Patient is here for colonoscopy. HPI: Patient is 53 year old Caucasian female who underwent Cologuard testing as a screening tool for CRC and it came back positive.  She denies rectal bleeding.  She has chronic constipation.  She tries to control her constipation with dietary measures fiber supplement and as needed laxative.  No history of abdominal pain or unintentional weight loss. She takes 2 doses of BC powder every day. Family history is negative for CRC.  Past Medical History:  Diagnosis Date  . Anxiety   . Arthritis   . Asthma   . Chronic back pain   . Constipation 04/02/2017  . COPD (chronic obstructive pulmonary disease) ([place])   . DDD (degenerative disc disease), lumbar   . Depression   . Dyspnea   . Fibromyalgia   . GERD (gastroesophageal reflux disease)   . H/O seasonal allergies   . Headache   . Hypothyroidism   . Osteoporosis   . Pneumonia 2008  . Ringing in ear, right   . Sciatica   . Spinal stenosis     Past Surgical History:  Procedure Laterality Date  . APPENDECTOMY    . CHOLECYSTECTOMY    . LEFT HEART CATH AND CORONARY ANGIOGRAPHY N/A 06/17/2018   Procedure: LEFT HEART CATH AND CORONARY ANGIOGRAPHY;  Surgeon: Leonie Man, MD;  Location: [facility];  Service: Cardiovascular;  Laterality: N/A;    Family History  Problem Relation Age of Onset  . COPD Father   . Diabetes Mellitus II Mother    Social History:  reports that she has been smoking cigarettes and e-cigarettes. She has a 15.00 pack-year smoking history. She has never used smokeless tobacco. She reports current alcohol use of about 4.0 standard drinks of alcohol per week. She reports that she does not use drugs.  Allergies:  Allergies  Allergen Reactions  . Isosorbide Nausea And Vomiting and Swelling    Severe headache  . Levaquin [Levofloxacin] Hives  . Morphine And Related Itching and Nausea And Vomiting    Medications  Prior to Admission  Medication Sig Dispense Refill  . acetaminophen (TYLENOL) 650 MG CR tablet Take 1,300 mg by mouth 2 (two) times daily.     Marland Kitchen albuterol (VENTOLIN HFA) 108 (90 Base) MCG/ACT inhaler 2 PUFFS EVERY 6 HOURS AS NEEDED FOR WHEEZING OR SHORTNESS OF BREATH (Patient taking differently: Inhale 2 puffs into the lungs every 6 (six) hours as needed for wheezing or shortness of breath. ) 18 g 1  . ALPRAZolam (XANAX) 1 MG tablet Take 1 tablet (1 mg total) by mouth 4 (four) times daily as needed for anxiety. (Patient taking differently: Take 1 mg by mouth 3 (three) times daily as needed for anxiety. ) 120 tablet 0  . bisacodyl (DULCOLAX) 5 MG EC tablet Take 5 mg by mouth daily as needed for moderate constipation.     Marland Kitchen buPROPion (WELLBUTRIN SR) 150 MG 12 hr tablet Take 1 tablet (150 mg total) by mouth 2 (two) times daily. 60 tablet 2  . Carboxymethylcellulose Sod PF (REFRESH PLUS) 0.5 % SOLN Place 1 drop into both eyes 3 (three) times daily.     . Cholecalciferol (VITAMIN D) 125 MCG (5000 UT) CAPS Take 1,500 Units by mouth daily.    . cyclobenzaprine (FLEXERIL) 10 MG tablet Take 30 mg by mouth 3 (three) times daily.     Marland Kitchen esomeprazole (NEXIUM) 40 MG capsule Take 40 mg by mouth daily  at 12 noon.    . fluticasone (FLONASE) 50 MCG/ACT nasal spray Place 2 sprays into both nostrils daily.    . Fluticasone-Umeclidin-Vilant (TRELEGY ELLIPTA) 100-62.5-25 MCG/INH AEPB Inhale 1 puff into the lungs daily. 1 each 6  . ipratropium-albuterol (DUONEB) 0.5-2.5 (3) MG/3ML SOLN Take 3 mLs by nebulization every 6 (six) hours as needed (shortness of breath). (Patient taking differently: Take 3 mLs by nebulization every 4 (four) hours as needed (shortness of breath). ) 360 mL 1  . levothyroxine (SYNTHROID, LEVOTHROID) 25 MCG tablet Take 25 mcg by mouth daily before breakfast.   11  . Multiple Vitamins-Minerals (HAIR SKIN NAILS PO) Take 1 tablet by mouth daily.    . nicotine (NICODERM CQ - DOSED IN MG/24 HOURS) 14  mg/24hr patch Place 1 patch (14 mg total) onto the skin daily. (Patient taking differently: Place 14 mg onto the skin daily as needed (smoke cessation). ) 28 patch 0  . oxyCODONE (ROXICODONE) 15 MG immediate release tablet Take 15 mg by mouth every 6 (six) hours as needed for pain.     . OXYGEN Inhale 2.5-3 L into the lungs continuous.     . pregabalin (LYRICA) 75 MG capsule Take 75 mg by mouth at bedtime.     . psyllium (METAMUCIL) 58.6 % powder Take 1 packet by mouth daily.    . magic mouthwash w/lidocaine SOLN Take 5 mLs by mouth 3 (three) times daily as needed for mouth pain. Swish and spit, do not swallow (Patient not taking: Reported on 03/15/2019) 100 mL 0    No results found for this or any previous visit (from the past 48 hour(s)). No results found.  ROS  Blood pressure 134/81, pulse 96, resp. rate (!) 23, height 5' 3.5" (1.613 m), weight 62.1 kg. Physical Exam  Constitutional: She appears well-developed and well-nourished.  HENT:  Mouth/Throat: Oropharynx is clear and moist.  Eyes: Conjunctivae are normal. No scleral icterus.  Neck: No thyromegaly present.  Cardiovascular: Normal rate, regular rhythm and normal heart sounds.  No murmur heard. Respiratory: Effort normal and breath sounds normal.  GI:  Abdomen is symmetrical.  She has small umbilical and appendectomy scar in right lower quadrant.  Abdomen is soft and nontender with organomegaly or masses.  Musculoskeletal:        General: No edema.  Lymphadenopathy:    She has no cervical adenopathy.  Neurological: She is alert.  Skin: Skin is warm and dry.     Assessment/Plan Positive Cologuard test. Diagnostic colonoscopy.  Hildred Laser, MD 03/25/2019, 8:54 AM

## 2019-03-25 NOTE — OR Nursing (Signed)
Tap water enema given per  V.O. .

## 2019-03-25 NOTE — Anesthesia Postprocedure Evaluation (Signed)
Anesthesia Post Note  Patient: SHAMERE DILWORTH  Procedure(s) Performed: COLONOSCOPY WITH PROPOFOL (N/A ) POLYPECTOMY  Patient location during evaluation: PACU Anesthesia Type: General Level of consciousness: awake and alert and oriented Pain management: pain level controlled Vital Signs Assessment: post-procedure vital signs reviewed and stable Respiratory status: spontaneous breathing Cardiovascular status: blood pressure returned to baseline and stable Postop Assessment: no apparent nausea or vomiting Anesthetic complications: no     Last Vitals:  Vitals:   03/25/19 0819  BP: 134/81  Pulse: 96  Resp: (!) 23    Last Pain:  Vitals:   03/25/19 0920  TempSrc:   PainSc: 0-No pain                 Boyce Keltner

## 2019-03-25 NOTE — Anesthesia Preprocedure Evaluation (Signed)
Anesthesia Evaluation  Patient identified by MRN, date of birth, ID band Patient awake  General Assessment Comment:States my lung collapsed with spine surg -denies CT placement- sounds more like atelectasis  Reviewed: Allergy & Precautions, NPO status , Patient's Chart, lab work & pertinent test results  History of Anesthesia Complications (+) history of anesthetic complications  Airway Mallampati: II  TM Distance: >3 FB Neck ROM: Full    Dental no notable dental hx. (+) Teeth Intact   Pulmonary shortness of breath and with exertion, asthma , pneumonia, resolved, COPD,  COPD inhaler and oxygen dependent, Current Smoker,  Round the clock o2 and inhalers  Still smoking  Smoked today despite instructions  Reports very limited ET   Pulmonary exam normal breath sounds clear to auscultation       Cardiovascular Exercise Tolerance: Poor + angina with exertion Normal cardiovascular examII Rhythm:Regular Rate:Normal  Denies recent CP -2019 cath showed normal coronaries but spasm noted - was placed on Imdur -doesn't take  Limited ET- related to pulmonary issues  Denies recent CP- never uses NTG   Neuro/Psych  Headaches, Anxiety Depression  Neuromuscular disease negative psych ROS   GI/Hepatic Neg liver ROS, GERD  Medicated and Controlled,Denies GERD today    Endo/Other  Hypothyroidism   Renal/GU negative Renal ROS  negative genitourinary   Musculoskeletal  (+) Arthritis , Osteoarthritis,  Fibromyalgia -, narcotic dependent  Abdominal   Peds negative pediatric ROS (+)  Hematology negative hematology ROS (+)   Anesthesia Other Findings   Reproductive/Obstetrics negative OB ROS                             Anesthesia Physical Anesthesia Plan  ASA: IV  Anesthesia Plan: General   Post-op Pain Management:    Induction: Intravenous  PONV Risk Score and Plan: 2 and Treatment may vary due to age  or medical condition, Propofol infusion and TIVA  Airway Management Planned: Nasal Cannula and Simple Face Mask  Additional Equipment:   Intra-op Plan:   Post-operative Plan:   Informed Consent: I have reviewed the patients History and Physical, chart, labs and discussed the procedure including the risks, benefits and alternatives for the proposed anesthesia with the patient or authorized representative who has indicated his/her understanding and acceptance.     Dental advisory given  Plan Discussed with: CRNA  Anesthesia Plan Comments: (Plan Full PPE use  Plan GA as tolerated -GETA as needed -d/w pt unlikely need for potential post procedure ventilation as need -voiced understanding WTP with same )        Anesthesia Quick Evaluation

## 2019-03-30 ENCOUNTER — Telehealth: Payer: Self-pay | Admitting: Emergency Medicine

## 2019-03-30 ENCOUNTER — Encounter (HOSPITAL_COMMUNITY): Payer: Self-pay | Admitting: Internal Medicine

## 2019-03-30 ENCOUNTER — Other Ambulatory Visit: Payer: Self-pay | Admitting: Emergency Medicine

## 2019-03-30 MED ORDER — NICOTINE 14 MG/24HR TD PT24: 14.0000 mg | MEDICATED_PATCH | Freq: Every day | TRANSDERMAL | 0 refills | Status: DC

## 2019-03-30 NOTE — Telephone Encounter (Signed)
Returned call to pt, verified she is using both Wellbutrin and nicotine patches. Pt verbalized she is using bother and pharmacy is [facility]. Refill submitted for nicotine patches. Nothing further needed.     Assessment & Plan:   COPD (chronic obstructive pulmonary disease) (HCC) - Stable - Continue Trelegy 1 puff daily; prn albuterol/duoneb q 6 hours  - Continue Mucinex twice daily  Chronic respiratory failure with hypoxia (HCC) - Ambulatory walk O2 low 92% on 3L pulsed  - Needs 3L on exertion while using POC  Tobacco abuse - Current tobacco use, 1/2 pack daily  - Seems motivated to quit smoking  - Continue Wellbutrin 300mg  daily and nicotine patches - Long discussion about smoking cessation > 7 mins     Martyn Ehrich, NP 03/14/2019

## 2019-04-15 ENCOUNTER — Telehealth: Payer: Self-pay | Admitting: Emergency Medicine

## 2019-04-15 NOTE — Telephone Encounter (Signed)
Spoke with the pt and notified of recs per [facility]  She verbalized understanding  Recall was placed

## 2019-04-15 NOTE — Telephone Encounter (Signed)
Max dose is 300mg  daily. Looks like she is taking 150 twice daily so this can not be increased. She can reschedule with Dr. Lamonte Sakai for 3 months if doing well.

## 2019-04-15 NOTE — Telephone Encounter (Signed)
Called and spoke with pt. I saw that pt's appt which was scheduled with RB 8/11 has been cancelled. When speaking with pt in regards to this, she said that she had told front staff who had answered the call that she was thinking about cancelling the appt and wanted to know from [place] if it would be okay to cancel the appt due to covid or if she should come in for the appt but then pt said that the appt did end up getting cancelled.  While speaking with pt, pt said that the wellbutrin has been working well for her but she is wanting to know if there is a higher dose that could possibly be called in for her.   Beth, please advise on all this for pt. Stated to pt if we needed to discuss this with Dr. Lamonte Sakai as well that he would be in the office Monday, 8/3.

## 2019-04-26 ENCOUNTER — Ambulatory Visit: Payer: Medicare Other | Admitting: Emergency Medicine

## 2019-05-28 ENCOUNTER — Other Ambulatory Visit: Payer: Self-pay | Admitting: Primary Care

## 2019-06-14 ENCOUNTER — Other Ambulatory Visit: Payer: Self-pay | Admitting: Primary Care

## 2019-07-04 ENCOUNTER — Ambulatory Visit (INDEPENDENT_AMBULATORY_CARE_PROVIDER_SITE_OTHER): Payer: Medicare HMO | Admitting: Emergency Medicine

## 2019-07-04 ENCOUNTER — Other Ambulatory Visit: Payer: Self-pay

## 2019-07-04 ENCOUNTER — Encounter: Payer: Self-pay | Admitting: Emergency Medicine

## 2019-07-04 VITALS — BP 122/70 | HR 103 | Temp 97.1°F | Ht 62.5 in | Wt 130.2 lb

## 2019-07-04 DIAGNOSIS — J9611 Chronic respiratory failure with hypoxia: Secondary | ICD-10-CM | POA: Diagnosis not present

## 2019-07-04 DIAGNOSIS — J431 Panlobular emphysema: Secondary | ICD-10-CM

## 2019-07-04 DIAGNOSIS — Z720 Tobacco use: Secondary | ICD-10-CM

## 2019-07-04 NOTE — Patient Instructions (Signed)
Spirometry paperwork completed today.  We will need to arrange for you to follow-up with another provider and have spirometry on the same day to complete your second copy of your paperwork. Please continue your Trelegy once daily as you have been taking it.  Rinse and gargle after using. Keep your albuterol and DuoNeb available to use up to every 4 hours if you need them for shortness of breath, chest tightness, wheezing. Flu shot today Please continue to work on decreasing your cigarettes. Wear your oxygen at 3 L/min at all times Follow with Dr Lamonte Sakai in 6 months or sooner if you have any problems

## 2019-07-04 NOTE — Assessment & Plan Note (Signed)
Wear your oxygen at 3 L/min at all times

## 2019-07-04 NOTE — Addendum Note (Signed)
Addended by: Desmond Dike C on: 07/04/2019 04:45 PM   Modules accepted: Orders

## 2019-07-04 NOTE — Assessment & Plan Note (Signed)
Please continue to work on decreasing your cigarettes.

## 2019-07-04 NOTE — Assessment & Plan Note (Signed)
Spirometry paperwork completed today.  We will need to arrange for you to follow-up with another provider and have spirometry on the same day to complete your second copy of your paperwork. Please continue your Trelegy once daily as you have been taking it.  Rinse and gargle after using. Keep your albuterol and DuoNeb available to use up to every 4 hours if you need them for shortness of breath, chest tightness, wheezing. Flu shot today Follow with Dr Lamonte Sakai in 6 months or sooner if you have any problems

## 2019-07-04 NOTE — Progress Notes (Signed)
Subjective:    Patient ID: Gloria Calderon, female    DOB: 03-30-1966, 53 y.o.   MRN: [id number]  COPD She complains of cough, shortness of breath and wheezing. Associated symptoms include postnasal drip. Pertinent negatives include no appetite change, ear pain, fever, headaches, rhinorrhea, sneezing, sore throat or trouble swallowing. Her past medical history is significant for COPD.   ROV 11/08/2018 --Gloria Calderon is 41, an active smoker (a few a day), active vaporized CBD oil user, with severe COPD and associated hypoxemic respiratory failure.  She was hospitalized with acute respiratory distress and apparent COPD exacerbation at [facility] earlier this month, was dx with the flu. Was treated for an AE-COPD. Entire course was complicated by an allergic rxn to isosorbide in the hospital.  She is currently managed on Trelegy. Her prednisone taper was extended, she started it on 2/20. Her exercise tolerance is low - has been using wheelchair some. She is unhappy with her POC, seems that she doesn't trigger reliably, has low battery - from [facility]. She is on Trelegy. She is using albuterol at least 1x a day.  She has DuoNeb, using prn, makes her have tremor / anxiety.   ROV 07/04/2019 --this is a follow-up visit for 53 year old active smoker with severe COPD and associated hypoxemic respiratory failure.  She has frequent exacerbations, last was February 2020. She is currently managed on Trelegy.  Uses DuoNeb about 1-2x a day, or albuterol approximately 3-5 x a day. She is currently smoking approximately about 5-10 cig a day. Still wants to stop - not really ready to set a quit date. She has been on wellbutrin.  Flu shot today.    Review of Systems  Constitutional: Negative for appetite change, fever and unexpected weight change.  HENT: Positive for congestion and postnasal drip. Negative for dental problem, ear pain, nosebleeds, rhinorrhea, sinus pressure, sneezing, sore throat and trouble swallowing.    Eyes: Negative for redness and itching.  Respiratory: Positive for cough, chest tightness, shortness of breath and wheezing.   Cardiovascular: Negative for palpitations and leg swelling.  Gastrointestinal: Negative for abdominal pain, nausea and vomiting.  Genitourinary: Negative for dysuria.  Musculoskeletal: Negative for joint swelling.  Skin: Negative for rash.  Neurological: Negative for headaches.  Hematological: Does not bruise/bleed easily.  Psychiatric/Behavioral: Negative for dysphoric mood. The patient is not nervous/anxious.        Objective:   Physical Exam Vitals:   07/04/19 1603  BP: 122/70  Pulse: (!) 103  Temp: (!) 97.1 F (36.2 C)  TempSrc: Oral  SpO2: 98%  Weight: 130 lb 3.2 oz (59.1 kg)  Height: 5' 2.5" (1.588 m)   Gen: Pleasant, well-nourished, in no distress, ill appearing  ENT: No lesions,  mouth clear,  oropharynx clear, nasal congestion, some hoarse voice  Neck: No JVD, no stridor  Lungs: No use of accessory muscles, soft B exp wheezes  Cardiovascular: RRR, heart sounds normal, no murmur or gallops, no peripheral edema  Musculoskeletal: No deformities, no cyanosis or clubbing  Neuro: alert, non focal  Skin: Warm, no lesions or rashes      Assessment & Plan:  COPD (chronic obstructive pulmonary disease) ([place]) Spirometry paperwork completed today.  We will need to arrange for you to follow-up with another provider and have spirometry on the same day to complete your second copy of your paperwork. Please continue your Trelegy once daily as you have been taking it.  Rinse and gargle after using. Keep your albuterol and DuoNeb available to use  up to every 4 hours if you need them for shortness of breath, chest tightness, wheezing. Flu shot today Follow with Dr Lamonte Sakai in 6 months or sooner if you have any problems  Chronic respiratory failure with hypoxia ([place]) Wear your oxygen at 3 L/min at all times  Tobacco abuse Please continue to work on  decreasing your cigarettes.  Baltazar Apo, MD, PhD 07/04/2019, 4:35 PM [facility] [phone] or if no answer [phone]

## 2019-07-25 ENCOUNTER — Other Ambulatory Visit: Payer: Self-pay

## 2019-07-25 ENCOUNTER — Other Ambulatory Visit (HOSPITAL_COMMUNITY)
Admission: RE | Admit: 2019-07-25 | Discharge: 2019-07-25 | Disposition: A | Discharge status: Home / Self Care | Payer: Medicare HMO | Source: Ambulatory Visit | Attending: Emergency Medicine | Admitting: Emergency Medicine

## 2019-07-25 DIAGNOSIS — Z20828 Contact with and (suspected) exposure to other viral communicable diseases: Secondary | ICD-10-CM | POA: Insufficient documentation

## 2019-07-25 DIAGNOSIS — Z01812 Encounter for preprocedural laboratory examination: Secondary | ICD-10-CM | POA: Insufficient documentation

## 2019-07-25 LAB — SARS CORONAVIRUS 2 (TAT 6-24 HRS): SARS Coronavirus 2: NEGATIVE

## 2019-07-28 ENCOUNTER — Other Ambulatory Visit: Payer: Self-pay

## 2019-07-28 ENCOUNTER — Ambulatory Visit (HOSPITAL_COMMUNITY)
Admission: RE | Admit: 2019-07-28 | Discharge: 2019-07-28 | Disposition: A | Discharge status: Home / Self Care | Payer: Medicare HMO | Source: Ambulatory Visit | Attending: Emergency Medicine | Admitting: Emergency Medicine

## 2019-07-28 DIAGNOSIS — J9611 Chronic respiratory failure with hypoxia: Secondary | ICD-10-CM | POA: Diagnosis not present

## 2019-07-28 LAB — PULMONARY FUNCTION TEST
DL/VA % pred: 48 %
DL/VA: 2.09 ml/min/mmHg/L
DLCO unc % pred: 43 %
DLCO unc: 8.67 ml/min/mmHg
FEF 25-75 Post: 0.35 L/sec
FEF 25-75 Pre: 0.21 L/sec
FEF2575-%Change-Post: 70 %
FEF2575-%Pred-Post: 13 %
FEF2575-%Pred-Pre: 7 %
FEV1-%Change-Post: 23 %
FEV1-%Pred-Post: 30 %
FEV1-%Pred-Pre: 24 %
FEV1-Post: 0.8 L
FEV1-Pre: 0.65 L
FEV1FVC-%Change-Post: -5 %
FEV1FVC-%Pred-Pre: 39 %
FEV6-%Change-Post: 31 %
FEV6-%Pred-Post: 63 %
FEV6-%Pred-Pre: 48 %
FEV6-Post: 2.07 L
FEV6-Pre: 1.58 L
FEV6FVC-%Change-Post: 0 %
FEV6FVC-%Pred-Post: 78 %
FEV6FVC-%Pred-Pre: 78 %
FVC-%Change-Post: 30 %
FVC-%Pred-Post: 80 %
FVC-%Pred-Pre: 61 %
FVC-Post: 2.71 L
FVC-Pre: 2.07 L
Post FEV1/FVC ratio: 30 %
Post FEV6/FVC ratio: 77 %
Pre FEV1/FVC ratio: 31 %
Pre FEV6/FVC Ratio: 76 %
RV % pred: 238 %
RV: 4.28 L
TLC % pred: 140 %
TLC: 6.9 L

## 2019-07-28 MED ORDER — ALBUTEROL SULFATE (2.5 MG/3ML) 0.083% IN NEBU
2.5000 mg | INHALATION_SOLUTION | Freq: Once | RESPIRATORY_TRACT | Status: AC
  Administered 2019-07-28: 16:00:00 2.5 mg via RESPIRATORY_TRACT

## 2019-07-29 ENCOUNTER — Ambulatory Visit: Payer: Medicare HMO | Admitting: Primary Care

## 2019-07-29 ENCOUNTER — Encounter: Payer: Self-pay | Admitting: Primary Care

## 2019-07-29 ENCOUNTER — Other Ambulatory Visit: Payer: Self-pay

## 2019-07-29 VITALS — BP 130/74 | HR 102 | Temp 98.7°F | Ht 63.0 in | Wt 126.4 lb

## 2019-07-29 DIAGNOSIS — M5440 Lumbago with sciatica, unspecified side: Secondary | ICD-10-CM | POA: Diagnosis not present

## 2019-07-29 DIAGNOSIS — J9611 Chronic respiratory failure with hypoxia: Secondary | ICD-10-CM | POA: Diagnosis not present

## 2019-07-29 DIAGNOSIS — Z720 Tobacco use: Secondary | ICD-10-CM

## 2019-07-29 NOTE — Progress Notes (Signed)
@Patient  ID: Gloria Calderon, female    DOB: [birth year], 53 y.o.   MRN: [id number]  Chief Complaint  Patient presents with   Results    Discuss results of PFT    Referring provider: Scotty Court, DO  HPI: 53 year old, current smoker (hx of vaping). PMH severe COPD, chronic respiratory failure with hypoxia, GERD, anxiety. Patient of Dr. Lamonte Sakai. Maintained on Trelegy, Albuterol hfa and Duonebs. Smoking cessation strongly encouraged.   Called on 10/22/18 with complaints of productive cough, 102 fever and HA. Dr. Lamonte Sakai called in Doxycycline for superimposed bronchitis and instructed to be checked for the flu. She was admitted to [facility] for COPD exacerbation, right lobe pneumonia and influenza A. Completed tamiflu. Continued doxycyline course and was started on Cefdinir and prednisone taper.   Previous [place] Encounters: 11/03/2018 Patient presents today for hospital follow-up. Accompanied by her husband today. Continues to have sob, chest tightness and dry cough since discharge on 10/28/18. Reports chest congestion, not coughing up the much.  She has one day left of antibiotics and prednisone taper. Need refill duoneb and albuterol hfa. Has flutter valve at home but has not used it. States that she had a bad experience during her hospitalization. Her issue was that she was given isosorbide nitrate for 3 days and reports allergy and facial swelling reaction. Afebrile today.   ROV 11/08/2018 --[name] is 53, an active smoker (a few a day), active vaporized CBD oil user, with severe COPD and associated hypoxemic respiratory failure.  She was hospitalized with acute respiratory distress and apparent COPD exacerbation at [facility] earlier this month, was dx with the flu. Was treated for an AE-COPD. Entire course was complicated by an allergic rxn to isosorbide in the hospital.  She is currently managed on Trelegy. Her prednisone taper was extended, she started it on 2/20. Her  exercise tolerance is low - has been using wheelchair some. She is unhappy with her POC, seems that she doesn't trigger reliably, has low battery - from [facility]. She is on Trelegy. She is using albuterol at least 1x a day.  She has DuoNeb, using prn, makes her have tremor / anxiety.   03/14/2019 Patient presents today for 1 month follow-up visit. Breathing is baseline. Symptoms worse in the morning. Continue Trelegy 1 puff daily as prescribed. Using nebulizer every morning. Feels embarrassed that she is still smoking 1/2 pack daily. She is using wellbutrin and nicotine patches. Lives with two smokers, her mom has mentioned wanting to quit too. States that she has issues with that battery on her POC not working.   07/29/2019 Patient presents today for follow-up visit with PFTs. Her breathing has been stable, no acute URI symptoms. She has an overall decline of 15% in her FEV1 since 2015. She is having chronic back pain. She was seen by a new PCP who prescribed fentanyl patches. She would like referral to pain management.   PFTs: 07/29/2019 - FVC (80%), FEV1 0.8 (30%), ratio 30, +BD response, DLCO 43% which corrects for lung volumes  01/06/19 - FVC 2.62 (75%), FEV1 1.25 (45%), ratio 48, no BD response, DLCO 48% which correct for lung volumes   Allergies  Allergen Reactions   Isosorbide Nausea And Vomiting and Swelling    Severe headache   Levaquin [Levofloxacin] Hives   Morphine And Related Itching and Nausea And Vomiting    Immunization History  Administered Date(s) Administered   Influenza Split 08/15/2013   Influenza,inj,Quad PF,6+ Mos 05/25/2014, 07/22/2017,  07/08/2018   Pneumococcal Polysaccharide-23 07/30/2015, 07/22/2017    Past Medical History:  Diagnosis Date   Anxiety    Arthritis    Asthma    Chronic back pain    Constipation 04/02/2017   COPD (chronic obstructive pulmonary disease) (HCC)    DDD (degenerative disc disease), lumbar    Depression    Dyspnea     Fibromyalgia    GERD (gastroesophageal reflux disease)    H/O seasonal allergies    Headache    Hypothyroidism    Osteoporosis    Pneumonia 2008   Ringing in ear, right    Sciatica    Spinal stenosis     Tobacco History: Social History   Tobacco Use  Smoking Status Current Every Day Smoker   Packs/day: 0.50   Years: 30.00   Pack years: 15.00   Types: Cigarettes, E-cigarettes  Smokeless Tobacco Never Used  Tobacco Comment   trying to quit   Ready to quit: Not Answered Counseling given: Not Answered Comment: trying to quit   Outpatient Medications Prior to Visit  Medication Sig Dispense Refill   acetaminophen (TYLENOL) 650 MG CR tablet Take 1,300 mg by mouth 2 (two) times daily.      albuterol (VENTOLIN HFA) 108 (90 Base) MCG/ACT inhaler 2 PUFFS EVERY 6 HOURS AS NEEDED FOR WHEEZING OR SHORTNESS OF BREATH (Patient taking differently: Inhale 2 puffs into the lungs every 6 (six) hours as needed for wheezing or shortness of breath. ) 18 g 1   ALPRAZolam (XANAX) 1 MG tablet Take 1 tablet (1 mg total) by mouth 4 (four) times daily as needed for anxiety. (Patient taking differently: Take 1 mg by mouth 3 (three) times daily as needed for anxiety. ) 120 tablet 0   bisacodyl (DULCOLAX) 5 MG EC tablet Take 5 mg by mouth daily as needed for moderate constipation.      buPROPion (WELLBUTRIN SR) 150 MG 12 hr tablet Take 1 tablet (150 mg total) by mouth 2 (two) times daily. 60 tablet 1   Carboxymethylcellulose Sod PF (REFRESH PLUS) 0.5 % SOLN Place 1 drop into both eyes 3 (three) times daily.      Cholecalciferol (VITAMIN D) 125 MCG (5000 UT) CAPS Take 1,500 Units by mouth daily.     cyclobenzaprine (FLEXERIL) 10 MG tablet Take 30 mg by mouth 3 (three) times daily.      esomeprazole (NEXIUM) 40 MG capsule Take 40 mg by mouth daily at 12 noon.     fluticasone (FLONASE) 50 MCG/ACT nasal spray Place 2 sprays into both nostrils daily.     ipratropium-albuterol (DUONEB)  0.5-2.5 (3) MG/3ML SOLN NEBULIZE 1 VIAL EVERY 6 HOURS AS NEEDED FOR SHORTNESS OF BREATH 360 mL 1   levothyroxine (SYNTHROID, LEVOTHROID) 25 MCG tablet Take 25 mcg by mouth daily before breakfast.   11   Multiple Vitamins-Minerals (HAIR SKIN NAILS PO) Take 1 tablet by mouth daily.     nicotine (NICODERM CQ - DOSED IN MG/24 HOURS) 14 mg/24hr patch Place 1 patch (14 mg total) onto the skin daily. 28 patch 0   oxyCODONE (ROXICODONE) 15 MG immediate release tablet Take 15 mg by mouth every 6 (six) hours as needed for pain.      OXYGEN Inhale 2.5-3 L into the lungs continuous.      pregabalin (LYRICA) 150 MG capsule Take 150 mg by mouth 2 (two) times daily.     psyllium (METAMUCIL) 58.6 % powder Take 1 packet by mouth daily.  TRELEGY ELLIPTA 100-62.5-25 MCG/INH AEPB INHALE 1 PUFF ONCE DAILY 60 each 5   No facility-administered medications prior to visit.     Review of Systems  Review of Systems  Constitutional: Negative.   Respiratory: Positive for shortness of breath. Negative for cough and wheezing.    Physical Exam  BP 130/74 (BP Location: Left Arm, Patient Position: Sitting, Cuff Size: Normal)    Pulse (!) 102    Temp 98.7 F (37.1 C)    Ht 5\' 3"  (1.6 m)    Wt 126 lb 6.4 oz (57.3 kg)    SpO2 97% Comment: on room air   BMI 22.39 kg/m  Physical Exam Constitutional:      Appearance: Normal appearance.  HENT:     Head: Normocephalic and atraumatic.     Mouth/Throat:     Comments: Deferred d/t masking Neck:     Musculoskeletal: Normal range of motion and neck supple.  Cardiovascular:     Rate and Rhythm: Normal rate and regular rhythm.  Pulmonary:     Effort: Pulmonary effort is normal.     Breath sounds: Normal breath sounds. No wheezing or rhonchi.  Musculoskeletal:     Comments: Limited d/t back pain  Skin:    General: Skin is warm and dry.  Neurological:     General: No focal deficit present.     Mental Status: She is alert and oriented to person, place, and time.  Mental status is at baseline.  Psychiatric:        Mood and Affect: Mood normal.        Behavior: Behavior normal.        Thought Content: Thought content normal.        Judgment: Judgment normal.      Lab Results:  CBC    Component Value Date/Time   WBC 7.0 07/12/2018 1146   RBC 4.80 07/12/2018 1146   HGB 15.4 (H) 07/12/2018 1146   HCT 48.3 (H) 07/12/2018 1146   PLT 293 07/12/2018 1146   MCV 100.6 (H) 07/12/2018 1146   MCH 32.1 07/12/2018 1146   MCHC 31.9 07/12/2018 1146   RDW 13.2 07/12/2018 1146   LYMPHSABS 3.7 05/24/2018 1121   MONOABS 1.0 05/24/2018 1121   EOSABS 0.1 05/24/2018 1121   BASOSABS 0.1 05/24/2018 1121    BMET    Component Value Date/Time   NA 142 07/12/2018 1146   K 4.0 07/12/2018 1146   CL 107 07/12/2018 1146   CO2 29 07/12/2018 1146   GLUCOSE 91 07/12/2018 1146   BUN 15 07/12/2018 1146   CREATININE 0.70 07/12/2018 1146   CALCIUM 8.9 07/12/2018 1146   GFRNONAA >60 07/12/2018 1146   GFRAA >60 07/12/2018 1146    BNP No results found for: BNP  ProBNP No results found for: PROBNP  Imaging: No results found.   Assessment & Plan:   COPD (chronic obstructive pulmonary disease) ([place]) - Patient has severe obstructive lung disease  - PFTs today showed declined in FEV1 by 15% compared to 2015 - FVC (80%), FEV1 0.8 (30%), ratio 30 - Continue TRELEGY 1 puff daily, continue duoneb q 4-6 hours prn sob/wheezing  - Second spirometry paperwork completed today  - FU in 6 months   Back pain - Referred to pain management   Chronic respiratory failure with hypoxia (HCC) - Continue 3L oxygen to keep O2 >88-90%   Tobacco abuse - Continue to encourage complete smoking cessation    Martyn Ehrich, NP 07/29/2019

## 2019-07-29 NOTE — Assessment & Plan Note (Addendum)
-   Patient has severe obstructive lung disease  - PFTs today showed declined in FEV1 by 15% compared to 2015 - FVC (80%), FEV1 0.8 (30%), ratio 30 - Continue TRELEGY 1 puff daily, continue duoneb q 4-6 hours prn sob/wheezing  - Second spirometry paperwork completed today  - FU in 6 months

## 2019-07-29 NOTE — Assessment & Plan Note (Signed)
Referred to pain management.

## 2019-07-29 NOTE — Assessment & Plan Note (Signed)
-   Continue to encourage complete smoking cessation

## 2019-07-29 NOTE — Assessment & Plan Note (Signed)
-   Continue 3L oxygen to keep O2 >88-90%

## 2019-07-29 NOTE — Patient Instructions (Addendum)
PFTs today completed showing severe obstructive lung disease, decreased 15% in FEV since 2015  Filled out paperwork and given copy of PFTs No changes today  Referral: [facility] pain management   Follow-up: 6 months with Dr. Lamonte Sakai or sooner if needed

## 2019-08-02 ENCOUNTER — Other Ambulatory Visit: Payer: Self-pay | Admitting: Primary Care

## 2019-08-02 NOTE — Telephone Encounter (Signed)
Beth, please advise if it is okay to refill med for pt.

## 2019-08-02 NOTE — Telephone Encounter (Signed)
yes

## 2019-08-16 ENCOUNTER — Telehealth: Payer: Self-pay | Admitting: Primary Care

## 2019-08-16 NOTE — Telephone Encounter (Signed)
Call made to [facility], was placed on hold 3 different times and later a voicemail kicked. I called back twice with the same result.   Call returned to patient, confirmed DOB, she is wanting to know if the referral was placed and if so how long it would take for them to contact her. She states [facility] told her she would call them herself. I made her aware that is not normally the process unless something urgent needs to be addressed. I made her aware I have called 3 times this morning and placed on hold each time. She expressed her thanks for me trying and states she will be patient and wait to see if they call. She states if they do not call within a week or so she will call us back.   Nothing further needed at this time.

## 2019-09-19 ENCOUNTER — Other Ambulatory Visit: Payer: Self-pay | Admitting: Primary Care

## 2019-09-19 NOTE — Telephone Encounter (Signed)
Beth, please advise if you are okay refilling med for pt.

## 2019-09-19 NOTE — Telephone Encounter (Signed)
Yes, please offer apt with pharmacy for smoking cessation

## 2019-09-22 NOTE — Telephone Encounter (Signed)
Called and spoke with pt letting her know that we were going to refill her Wellbutrin but stated to her that Beth wanted [place] to schedule her an appt with our pharmacy team for smoking cessation. Pt verbalized understanding and stated that would be fine so appt scheduled for pt with pharmacy team 1/15 at 3:30.  Nothing further needed.

## 2019-09-30 ENCOUNTER — Telehealth: Payer: Self-pay | Admitting: Pharmacist

## 2019-09-30 ENCOUNTER — Ambulatory Visit (INDEPENDENT_AMBULATORY_CARE_PROVIDER_SITE_OTHER): Payer: Medicare HMO | Admitting: Pharmacist

## 2019-09-30 ENCOUNTER — Other Ambulatory Visit: Payer: Self-pay

## 2019-09-30 DIAGNOSIS — F17210 Nicotine dependence, cigarettes, uncomplicated: Secondary | ICD-10-CM | POA: Diagnosis not present

## 2019-09-30 DIAGNOSIS — Z720 Tobacco use: Secondary | ICD-10-CM

## 2019-09-30 NOTE — Telephone Encounter (Signed)
During office visit on 09/30/2019 at 3:30pm patient advised that she needed to speak to someone and would call back in 10 mins.  Patient did not return call before end of shift.  Please call to reschedule appointment for in office visit only.  Thanks!   Mariella Saa, PharmD, [place], [place] Clinical Specialty Pharmacist [phone]  09/30/2019 4:18 PM

## 2019-09-30 NOTE — Progress Notes (Signed)
Subjective  Patient presents to [facility] and seen by the pharmacist for smoking cessation counseling.   Patient was referred and last seen by Derl Barrow, NP on on 07/29/2019.  Pertinent past medical history includes history of tobacco abuse, COPD, GERD, hypothyroidism, anxiety.  Patient states that she is ready to quit smoking but just does not know how to start.  She states she has been trying to cut back after the holidays.  She sometimes smokes less than half a pack but can smoke almost a full pack when she is under stress.  She does not always smoke a full cigarette.  Patient states her mother, father, and both siblings s started smoking from a young age.  She currently lives with her mother and her mother's boyfriend and they both smoke.  They smoke in the house.  She does not smoke in her car.  She currently is not working as she is on disability.  Her hobbies include making flower arrangements, watching TV, and listening to music and YouTube videos about psychology and church.   Social History   Tobacco Use  Smoking Status Current Every Day Smoker  . Packs/day: 0.50  . Years: 30.00  . Pack years: 15.00  . Types: Cigarettes, E-cigarettes  Smokeless Tobacco Never Used  Tobacco Comment   trying to quit     Tobacco Use History  Age when started using tobacco on a daily basis at 84 years. Her parents smoked in the house.   Type: cigarettes and vape.  Number of cigarettes per day half pack a day, brand [place] 100.   Smokes first cigarette 20 minutes after waking.  Does not wake at night to smoke  Triggers include eating chocolate, stress, coffee.  Quit Attempt History   Most recent quit attempt end of September.  Longest time ever been tobacco free 4-5 days.  Methods tried in the past include chantix and discontinued due to mood swings.  Nictotine patch.  Rates IMPORTANCE of quitting tobacco on 1-10 scale of 10.  Rates READINESS of quitting tobacco on 1-10 scale  of 10.  Rates CONFIDENCE of quitting tobacco on 1-10 scale of 6.  Motivators to quitting include tired of being sick, using oxygen, coughing; barriers include friends smoke    Immunization History  Administered Date(s) Administered  . Influenza Split 08/15/2013  . Influenza,inj,Quad PF,6+ Mos 05/25/2014, 07/22/2017, 07/08/2018  . Pneumococcal Polysaccharide-23 07/30/2015, 07/22/2017     Assessment and Plan  1. Smoking Cessation  Patient has a long history of smoking and started at 54 years old.  The longest period of time that she has been smoke-free is 4 to 5 days.  Patient has tried Chantix in the past but states that it caused severe mood swings.  She is currently on Wellbutrin max dose.  She recently tried the nicotine patch.  She started at 14 mg with good response.  She ran out of patches and was not sure if insurance would cover so she used her friend's 21 mg patch.  The 21 mg patch caused her to have anxiety.  Started to review STAR Quit plan.    Patient advised that she needed to speak to someone and would call back in 10 mins.  Patient did not return call before end of shift.  Will have front desk/triage reach out to patient to reschedule appointment.   3. Immunizations Patient is due for her annual influenza vaccine.  She is up-to-date with pneumonia vaccine.  She is eligible for  the Shingrix vaccine.  This appointment required  25 minutes of patient care (this includes precharting, chart review, review of results, face-to-face care, etc.).   Mariella Saa, PharmD, [place], [place] [facility] Pharmacist [phone]  09/30/2019 4:15 PM

## 2019-10-10 NOTE — Telephone Encounter (Signed)
LVMTCB x 1 for patient. 

## 2019-10-17 NOTE — Progress Notes (Deleted)
Subjective Patient presents to [facility] and seen by the pharmacist for smoking cessation counseling.   Patient was referred and last seen by  *** on ***.    Social History   Tobacco Use  Smoking Status Current Every Day Smoker  . Packs/day: 0.50  . Years: 30.00  . Pack years: 15.00  . Types: Cigarettes, E-cigarettes  Smokeless Tobacco Never Used  Tobacco Comment   trying to quit     Tobacco Use History  Age when started using tobacco on a daily basis ***.  Type: {Nicotine:3044014::"cigarettes","cigar","pipe","electronic cigarettes","snus"}.  Number of cigarettes per day ***, brand ***.  Smokes first cigarette *** minutes after waking.  {Does/does not:3044014::"Does","Does not"} wake at night to smoke  Triggers include {hx nicotine triggers:311123}.  Quit Attempt History   Most recent quit attempt ***.  Longest time ever been tobacco free ***.  Methods tried in the past include {CHL AMB PCMH MEDICATIONS FOR SMOKING CESSATION:20759}.   Rates IMPORTANCE of quitting tobacco on 1-10 scale of ***.  Rates READINESS of quitting tobacco on 1-10 scale of ***.  Rates CONFIDENCE of quitting tobacco on 1-10 scale of ***.  Motivators to quitting include ***; barriers include {smoking cessation barriers:18118}    Immunization History  Administered Date(s) Administered  . Influenza Split 08/15/2013  . Influenza,inj,Quad PF,6+ Mos 05/25/2014, 07/22/2017, 07/08/2018  . Pneumococcal Polysaccharide-23 07/30/2015, 07/22/2017     Assessment and Plan  1. Smoking Cessation  a. Provided information on 1 800-QUIT NOW support program.  b. Non-Pharmacologic Reviewed STAR Quit Plan.   S-set quit date: *** T-tell everyone: *** A-anticipate challenges: ***. Patient picked 3 alternative activities to try instead of smoking a cigarette 1) *** 2) *** 3) *** R- remove all tobacco products: patient will trash cigarettes, lighters, and ash trays by *** c. Pharmacologic i.  Initiated nicotine replacement tx with ***. Patient counseled on purpose, proper use, and potential adverse effects.  1. Nicotine Patch a. Patient counseled on purpose, proper use, and potential adverse effects, including mild itching or redness at the point of application, headache, trouble sleeping, and/or vivid dreams    b. Patch Schedule for >10 cigarettes daily i. Weeks 1-6: one 21 mg patch daily ii. Weeks 7-8: one 14 mg patch daily iii. Weeks 9-10: one 7 mg patch daily c. Patch Schedule for <10 cigarettes daily i. Weeks 1 to 6: one nicotine patch (14 mg) daily. I will call and re-assess how you are doing at the end of 6 weeks to see how you are doing on 14 mg patch and if you are ready to decrease dose of patch. ii. Weeks 7-8: one nicotine patch (7 mg) daily 2. Nicotine Lozenge a. Patient counseled on purpose, proper use, and potential adverse effects including nausea, hiccups, cough, and heartburn.  Instructed patient to use  2 mg unless the smoke within 30 minutes of waking up in which they should use 4 mg. b. Lozenge dosing schedule i. Weeks 1 to 6: 1 lozenge every 1 to 2 hours (maximum: 5 lozenges every 6 hours; 20 lozenges/day); to increase chances of quitting, use at least 9 lozenges/day during the first 6 weeks ii. Weeks 7 to 9: 1 lozenge every 2 to 4 hours (maximum: 5 lozenges every 6 hours; 20 lozenges/day) iii. Weeks 10 to 12: 1 lozenge every 4 to 8 hours (maximum: 5 lozenges every 6 hours; 20 lozenges/day) 3. Nicotine Gum a. Patient counseled on purpose, proper use, and potential adverse effects including jaw soreness and upset stomach if  swallowing saliva.  Instructed patient to use 4 mg if they smoke a pack a day or more and 2 mg if they smoke less than a pack a day. b. Gum dosing schedule i. Weeks 1 to 6: Chew 1 piece of gum every 1 to 2 hours (maximum: 24 pieces/day); to increase chances of quitting, chew at least 9 pieces/day during the first 6 weeks ii. Weeks 7 to 9: Chew 1  piece of gum every 2 to 4 hours (maximum: 24 pieces/day) iii. Weeks 10 to 12: Chew 1 piece of gum every 4 to 8 hours (maximum: 24 pieces/day) 4. Bupropion a. Initiated bupropion ***. Patient with no PMH of seizures. Patient counseled on purpose, proper use, and potential adverse effects, including insomnia, and potential change in mood.  5. Varenicline a. Initiated varenicline titration of 0.5 mg by mouth once daily with food x3 days, then 0.5 mg by mouth twice daily with food x4 days, then 1 mg by mouth twice daily with food thereafter.  CrCL greater than 30 mL/min. Patient counseled on purpose, proper use, and potential adverse effects, including GI upset, and potential change in mood.  2. Medication Reconciliation a. A drug regimen assessment was performed, including review of allergies, interactions, disease-state management, dosing and immunization history. Medications were reviewed with the patient, including name, instructions, indication, goals of therapy, potential side effects, importance of adherence, and safe use. 3. Immunizations a. Patient is indicated for influenzae, pneumonia, and shingles vaccinations.

## 2019-10-18 ENCOUNTER — Telehealth: Payer: Self-pay | Admitting: Emergency Medicine

## 2019-10-18 NOTE — Telephone Encounter (Signed)
Spoke with pt, she is having cough, congestion but its not out of the norm for her because she has COPD and this time of year is worse for her. I advised her that per protocal, we were not seeing patients with any symptoms, even if it's their normal. Does the pharmacy team have virtual visits? She has an appointment tomorrow.  RB can we send something in for cough and congestion. Pt states she doesn't feel back but it may be a start to COPD exacerbation. She denies any other symptoms. Please advise.

## 2019-10-18 NOTE — Telephone Encounter (Signed)
The pharmacy stated they do not do virtual visits at this time. I called pt to let her know. Nothing further is needed. We rescheduled on 10/28/2019 at 3:30 pm

## 2019-10-18 NOTE — Telephone Encounter (Signed)
I called and spoke with the patient and she was agreeable to a televisit because she would like to see if she needs an antibiotic. I have scheduled her with Dr. Lamonte Sakai for tomorrow 10/19/19 at 10:15.

## 2019-10-18 NOTE — Telephone Encounter (Signed)
I think she needs a phone visit with me or APP to determine whether she has an acute flare

## 2019-10-19 ENCOUNTER — Other Ambulatory Visit: Payer: Self-pay

## 2019-10-19 ENCOUNTER — Ambulatory Visit: Payer: Medicare HMO

## 2019-10-19 ENCOUNTER — Ambulatory Visit (INDEPENDENT_AMBULATORY_CARE_PROVIDER_SITE_OTHER): Payer: Medicare Other | Admitting: Emergency Medicine

## 2019-10-19 ENCOUNTER — Encounter: Payer: Self-pay | Admitting: Emergency Medicine

## 2019-10-19 DIAGNOSIS — J431 Panlobular emphysema: Secondary | ICD-10-CM | POA: Diagnosis not present

## 2019-10-19 MED ORDER — PREDNISONE 10 MG PO TABS: ORAL_TABLET | ORAL | 0 refills | Status: DC

## 2019-10-19 MED ORDER — AZITHROMYCIN 250 MG PO TABS: ORAL_TABLET | ORAL | 0 refills | Status: AC

## 2019-10-19 NOTE — Progress Notes (Signed)
Virtual Visit via Telephone Note  I connected with Gloria Calderon on 10/19/19 at 10:15 AM EST by telephone and verified that I am speaking with the correct person using two identifiers.  Location: Patient: Home Provider: Office   I discussed the limitations, risks, security and privacy concerns of performing an evaluation and management service by telephone and the availability of in person appointments. I also discussed with the patient that there may be a patient responsible charge related to this service. The patient expressed understanding and agreed to proceed.   History of Present Illness: 54 year old longtime smoker with severe COPD and a bronchitic phenotype with frequent exacerbations.  She also has associated hypoxemic respiratory failure.  She is currently smoking approximately. Currently managed on Trelegy, DuoNeb as needed which she uses approximately, albuterol HFA Flonase, Nexium Oxygen at 2.5 to 3 L/min   Observations/Objective: She is having more cough, congestion - clear mucous. No real increase in head congestion on flonase. She is wheezing, having chest tightness. Able to take her BD's without difficulty.   Assessment and Plan: Continue trelegy pred taper and azithro for evolving AE-COPD Smoking cessation discussed > having trouble after the death of her husband.   Follow Up Instructions: 3 months   I discussed the assessment and treatment plan with the patient. The patient was provided an opportunity to ask questions and all were answered. The patient agreed with the plan and demonstrated an understanding of the instructions.   The patient was advised to call back or seek an in-person evaluation if the symptoms worsen or if the condition fails to improve as anticipated.  I provided 12 minutes of non-face-to-face time during this encounter.   Collene Gobble, MD

## 2019-10-25 ENCOUNTER — Telehealth: Payer: Self-pay | Admitting: Emergency Medicine

## 2019-10-25 ENCOUNTER — Other Ambulatory Visit: Payer: Self-pay | Admitting: *Deleted

## 2019-10-25 MED ORDER — PREDNISONE 10 MG PO TABS: ORAL_TABLET | ORAL | 0 refills | Status: DC

## 2019-10-25 NOTE — Telephone Encounter (Signed)
Spoke with the pt I have scheduled her with Beth for tomorrow at 9:30 am  I have sent her pred to [facility]

## 2019-10-25 NOTE — Telephone Encounter (Signed)
Spoke with the pt  She states that she finished her pred and zpack on Sunday 2/7  She states she "over did it for the superbowl"- now having increased SOB, chest tightness, wheezing, cough with yellow sputum  She states that she was not around any sick contacts, just her family- total of 4 people  She denies fever, chills, sore throat, body aches, loss of taste or smell  Please advise thanks

## 2019-10-25 NOTE — Telephone Encounter (Signed)
She will need to be seen - her persistent SOB suggests a more chronic process, not just AE-COPD. I am OK giving her another course of prednisone to see if she benefits. We can factor this into our decision making about how to treat going forward.   Give her pred > Take 40mg  daily for 3 days, then 30mg  daily for 3 days, then 20mg  daily for 3 days, then 10mg  daily for 3 days, then stop

## 2019-10-26 ENCOUNTER — Other Ambulatory Visit: Payer: Self-pay

## 2019-10-26 ENCOUNTER — Ambulatory Visit (INDEPENDENT_AMBULATORY_CARE_PROVIDER_SITE_OTHER): Payer: Medicare Other | Admitting: Primary Care

## 2019-10-26 ENCOUNTER — Ambulatory Visit (INDEPENDENT_AMBULATORY_CARE_PROVIDER_SITE_OTHER): Payer: Medicare Other

## 2019-10-26 ENCOUNTER — Encounter: Payer: Self-pay | Admitting: Primary Care

## 2019-10-26 VITALS — BP 96/64 | HR 96 | Temp 97.6°F | Ht 64.0 in | Wt 120.0 lb

## 2019-10-26 DIAGNOSIS — J441 Chronic obstructive pulmonary disease with (acute) exacerbation: Secondary | ICD-10-CM

## 2019-10-26 DIAGNOSIS — J431 Panlobular emphysema: Secondary | ICD-10-CM | POA: Diagnosis not present

## 2019-10-26 DIAGNOSIS — J9611 Chronic respiratory failure with hypoxia: Secondary | ICD-10-CM | POA: Diagnosis not present

## 2019-10-26 MED ORDER — BREZTRI AEROSPHERE 160-9-4.8 MCG/ACT IN AERO: 2.0000 | INHALATION_SPRAY | Freq: Two times a day (BID) | RESPIRATORY_TRACT | 0 refills | Status: DC

## 2019-10-26 MED ORDER — METHYLPREDNISOLONE ACETATE 80 MG/ML IJ SUSP
80.0000 mg | Freq: Once | INTRAMUSCULAR | Status: AC
  Administered 2019-10-26: 12:00:00 80 mg via INTRAMUSCULAR

## 2019-10-26 NOTE — Assessment & Plan Note (Addendum)
-   Frequent exacerbations, most recently treated last week with zpack/prednisone  - FEV1 0.8 (30%), ratio 30 +BD response  - Changing TRELEGY to BREZTRI two puffs twice daily (if not covered, may qualify for Trelegy 200) - Depo-medrol 80mg  IM x1 in office - Take second prednisone taper as prescribed per Dr. Lamonte Sakai  - Needs CXR and COVID testing  - FU in 4 weeks (consider adding daliresp if not already tried)

## 2019-10-26 NOTE — Patient Instructions (Addendum)
COPD: - Stop Trelegy  [employer], take two puffs twice daily (sample given) - Continue Duoneb 3-4 times a day for shortness of breath/wheezing - Continue mucinex twice daily - Pick up prednisone taper and take as prescribed  - Recommend you get covid vaccine when feeling well  - Continue to work on cutting back smoking  Office treatment: - Depo-medrol 80mg  IM x1  Orders: - Covid testing  Schedule COVID testing: Text COVID to [phone] or call [phone]   Follow-up: 4 weeks with Dr. Lamonte Sakai   COVID-19 Vaccine Information can be found at: ShippingScam.co.uk For questions related to vaccine distribution or appointments, please email vaccine@[email or web address] .com or call [phone].     Steps to Quit Smoking Smoking tobacco is the leading cause of preventable death. It can affect almost every organ in the body. Smoking puts you and people around you at risk for many serious, long-lasting (chronic) diseases. Quitting smoking can be hard, but it is one of the best things that you can do for your health. It is never too late to quit. How do I get ready to quit? When you decide to quit smoking, make a plan to help you succeed. Before you quit:  Pick a date to quit. Set a date within the next 2 weeks to give you time to prepare.  Write down the reasons why you are quitting. Keep this list in places where you will see it often.  Tell your family, friends, and co-workers that you are quitting. Their support is important.  Talk with your doctor about the choices that may help you quit.  Find out if your health insurance will pay for these treatments.  Know the people, places, things, and activities that make you want to smoke (triggers). Avoid them. What first steps can I take to quit smoking?  Throw away all cigarettes at home, at work, and in your car.  Throw away the things that you use when you smoke, such as ashtrays and  lighters.  Clean your car. Make sure to empty the ashtray.  Clean your home, including curtains and carpets. What can I do to help me quit smoking? Talk with your doctor about taking medicines and seeing a counselor at the same time. You are more likely to succeed when you do both.  If you are pregnant or breastfeeding, talk with your doctor about counseling or other ways to quit smoking. Do not take medicine to help you quit smoking unless your doctor tells you to do so. To quit smoking: Quit right away  Quit smoking totally, instead of slowly cutting back on how much you smoke over a period of time.  Go to counseling. You are more likely to quit if you go to counseling sessions regularly. Take medicine You may take medicines to help you quit. Some medicines need a prescription, and some you can buy over-the-counter. Some medicines may contain a drug called nicotine to replace the nicotine in cigarettes. Medicines may:  Help you to stop having the desire to smoke (cravings).  Help to stop the problems that come when you stop smoking (withdrawal symptoms). Your doctor may ask you to use:  Nicotine patches, gum, or lozenges.  Nicotine inhalers or sprays.  Non-nicotine medicine that is taken by mouth. Find resources Find resources and other ways to help you quit smoking and remain smoke-free after you quit. These resources are most helpful when you use them often. They include:  Online chats with a Social worker.  Phone quitlines.  Printed Furniture conservator/restorer.  Support groups or group counseling.  Text messaging programs.  Mobile phone apps. Use apps on your mobile phone or tablet that can help you stick to your quit plan. There are many free apps for mobile phones and tablets as well as websites. Examples include Quit Guide from the [employer] and smokefree.gov  What things can I do to make it easier to quit?   Talk to your family and friends. Ask them to support and encourage  you.  Call a phone quitline (1-800-QUIT-NOW), reach out to support groups, or work with a Social worker.  Ask people who smoke to not smoke around you.  Avoid places that make you want to smoke, such as: ? Bars. ? Parties. ? Smoke-break areas at work.  Spend time with people who do not smoke.  Lower the stress in your life. Stress can make you want to smoke. Try these things to help your stress: ? Getting regular exercise. ? Doing deep-breathing exercises. ? Doing yoga. ? Meditating. ? Doing a body scan. To do this, close your eyes, focus on one area of your body at a time from head to toe. Notice which parts of your body are tense. Try to relax the muscles in those areas. How will I feel when I quit smoking? Day 1 to 3 weeks Within the first 24 hours, you may start to have some problems that come from quitting tobacco. These problems are very bad 2-3 days after you quit, but they do not often last for more than 2-3 weeks. You may get these symptoms:  Mood swings.  Feeling restless, nervous, angry, or annoyed.  Trouble concentrating.  Dizziness.  Strong desire for high-sugar foods and nicotine.  Weight gain.  Trouble pooping (constipation).  Feeling like you may vomit (nausea).  Coughing or a sore throat.  Changes in how the medicines that you take for other issues work in your body.  Depression.  Trouble sleeping (insomnia). Week 3 and afterward After the first 2-3 weeks of quitting, you may start to notice more positive results, such as:  Better sense of smell and taste.  Less coughing and sore throat.  Slower heart rate.  Lower blood pressure.  Clearer skin.  Better breathing.  Fewer sick days. Quitting smoking can be hard. Do not give up if you fail the first time. Some people need to try a few times before they succeed. Do your best to stick to your quit plan, and talk with your doctor if you have any questions or concerns. Summary  Smoking tobacco is  the leading cause of preventable death. Quitting smoking can be hard, but it is one of the best things that you can do for your health.  When you decide to quit smoking, make a plan to help you succeed.  Quit smoking right away, not slowly over a period of time.  When you start quitting, seek help from your doctor, family, or friends. This information is not intended to replace advice given to you by your health care provider. Make sure you discuss any questions you have with your health care provider. Document Revised: 05/27/2019 Document Reviewed: 11/20/2018 Elsevier Patient Education  [place].

## 2019-10-26 NOTE — Progress Notes (Signed)
Spoke with pt and notified of results per Beth.  Pt verbalized understanding and denied any questions. 

## 2019-10-26 NOTE — Progress Notes (Signed)
@Patient  ID: Gloria Calderon, female    DOB: 05/24/1966, 54 y.o.   MRN: [id number]  Chief Complaint  Patient presents with  . Acute Visit    C/o increased sob x 4 days ago,wheezing, cough-clear to yellow,no fcs    Referring provider: Scotty Court, DO  HPI: 54 year old, current smoker (hx of vaping). PMH severe COPD with frequent exacerbations, chronic respiratory failure with hypoxia, GERD, anxiety. Patient of Dr. Lamonte Sakai. Maintained on Trelegy, Albuterol hfa and Duonebs. Oxygen 2.5-3L/min.   Previous [place] Encounters: 11/03/2018 Patient presents today for hospital follow-up. Accompanied by her husband today. Called on 10/22/18 with complaints of productive cough, 102 fever and HA. Dr. Lamonte Sakai called in Doxycycline for superimposed bronchitis and instructed to be checked for the flu. She was admitted to [facility] for COPD exacerbation, right lobe pneumonia and influenza A. Completed tamiflu. Continued doxycyline course and was started on Cefdinir and prednisone taper.  Continues to have sob, chest tightness and dry cough since discharge on 10/28/18. Reports chest congestion, not coughing up the much.  She has one day left of antibiotics and prednisone taper. Need refill duoneb and albuterol hfa. Has flutter valve at home but has not used it. States that she had a bad experience during her hospitalization. Her issue was that she was given isosorbide nitrate for 3 days and reports allergy and facial swelling reaction. Afebrile today.   ROV 11/08/2018 --[name] is 2, an active smoker (a few a day), active vaporized CBD oil user, with severe COPD and associated hypoxemic respiratory failure.  She was hospitalized with acute respiratory distress and apparent COPD exacerbation at [facility] earlier this month, was dx with the flu. Was treated for an AE-COPD. Entire course was complicated by an allergic rxn to isosorbide in the hospital.  She is currently managed on Trelegy. Her  prednisone taper was extended, she started it on 2/20. Her exercise tolerance is low - has been using wheelchair some. She is unhappy with her POC, seems that she doesn't trigger reliably, has low battery - from [facility]. She is on Trelegy. She is using albuterol at least 1x a day.  She has DuoNeb, using prn, makes her have tremor / anxiety.   03/14/2019 Patient presents today for 1 month follow-up visit. Breathing is baseline. Symptoms worse in the morning. Continue Trelegy 1 puff daily as prescribed. Using nebulizer every morning. Feels embarrassed that she is still smoking 1/2 pack daily. She is using wellbutrin and nicotine patches. Lives with two smokers, her mom has mentioned wanting to quit too. States that she has issues with that battery on her POC not working.   07/29/2019 Patient presents today for follow-up visit with PFTs. Her breathing has been stable, no acute URI symptoms. She has an overall decline of 15% in her FEV1 since 2015. She is having chronic back pain. She was seen by a new PCP who prescribed fentanyl patches. She would like referral to pain management.   10/19/19- Acute televiist, Dr. Lamonte Sakai  Treated for COPD exacerbation with z-pack and prednisone taper   10/26/2019 Patient presents today for acute office visit. Called office yesterday stating that she "over did it" for the super bowl and requested another prednisone taper. She had gathered with four people without masks for party. She also got her hair colored and cut on Saturday Feb 6th. She developed increased shortness of breath 4 days ago. Associated wheezing and productive cough with clear/yellow mucus. She also reports hot flashes  with sweats, sore throat from coughing and chronic headache. Feels rattling in her chest. She is taking mucinex twice daily and tussin cough syrup. Compliant with Trelegy but feels it makes her cough worse. She used her duoneb three times daily over the last several days with improvement in breathing.  Finished prednisone yesterday, Dr. Lamonte Sakai called in another course of prednisone but pharmacy states that she can not pick up prescription until 2/12. She remains on 3L oxygen which is her baseline. States that she needed to increase her oxygen a couple of days ago for a short period of time. She is still smoking 1/2 pack day, reports having hard time quitting smoking. States that she has not put forth the effort that she needs to quit. She has been on xanax for >10 years, feels having p[rn medication will help with anxiety when trying to quit. She would like to get a 3 month supply if possible. She is still taking wellbutrin. Denies fever, chills, sinus symptoms, N/V/D. She has not received covid vaccine and is unsure if she would like to get it.     PFTs: 07/29/2019 - FVC (80%), FEV1 0.8 (30%), ratio 30, +BD response, DLCO 43% which corrects for lung volumes  01/06/19 - FVC 2.62 (75%), FEV1 1.25 (45%), ratio 48, no BD response, DLCO 48% which correct for lung volumes   Allergies  Allergen Reactions  . Isosorbide Nausea And Vomiting and Swelling    Severe headache  . Levaquin [Levofloxacin] Hives  . Morphine And Related Itching and Nausea And Vomiting    Immunization History  Administered Date(s) Administered  . Influenza Split 08/15/2013, 05/26/2019  . Influenza,inj,Quad PF,6+ Mos 05/25/2014, 07/22/2017, 07/08/2018  . Pneumococcal Polysaccharide-23 07/30/2015, 07/22/2017    Past Medical History:  Diagnosis Date  . Anxiety   . Arthritis   . Asthma   . Chronic back pain   . Constipation 04/02/2017  . COPD (chronic obstructive pulmonary disease) ([place])   . DDD (degenerative disc disease), lumbar   . Depression   . Dyspnea   . Fibromyalgia   . GERD (gastroesophageal reflux disease)   . H/O seasonal allergies   . Headache   . Hypothyroidism   . Osteoporosis   . Pneumonia 2008  . Ringing in ear, right   . Sciatica   . Spinal stenosis     Tobacco History: Social History    Tobacco Use  Smoking Status Current Every Day Smoker  . Packs/day: 1.25  . Years: 30.00  . Pack years: 37.50  . Types: Cigarettes, E-cigarettes  Smokeless Tobacco Never Used  Tobacco Comment   trying to quit   Ready to quit: Not Answered Counseling given: Not Answered Comment: trying to quit   Outpatient Medications Prior to Visit  Medication Sig Dispense Refill  . acetaminophen (TYLENOL) 650 MG CR tablet Take 1,300 mg by mouth 2 (two) times daily.     Marland Kitchen albuterol (VENTOLIN HFA) 108 (90 Base) MCG/ACT inhaler 2 PUFFS EVERY 6 HOURS AS NEEDED FOR WHEEZING OR SHORTNESS OF BREATH (Patient taking differently: Inhale 2 puffs into the lungs every 6 (six) hours as needed for wheezing or shortness of breath. ) 18 g 1  . ALPRAZolam (XANAX) 1 MG tablet Take 1 tablet (1 mg total) by mouth 4 (four) times daily as needed for anxiety. (Patient taking differently: Take 1 mg by mouth 3 (three) times daily as needed for anxiety. ) 120 tablet 0  . bisacodyl (DULCOLAX) 5 MG EC tablet Take 5 mg by mouth  daily as needed for moderate constipation.     Marland Kitchen buPROPion (WELLBUTRIN SR) 150 MG 12 hr tablet Take 1 tablet (150 mg total) by mouth 2 (two) times daily. 60 tablet 0  . Carboxymethylcellulose Sod PF (REFRESH PLUS) 0.5 % SOLN Place 1 drop into both eyes 3 (three) times daily.     . Cholecalciferol (VITAMIN D) 125 MCG (5000 UT) CAPS Take 1,500 Units by mouth daily.    . cyclobenzaprine (FLEXERIL) 10 MG tablet Take 30 mg by mouth 3 (three) times daily.     Marland Kitchen esomeprazole (NEXIUM) 40 MG capsule Take 40 mg by mouth daily at 12 noon.    . fluticasone (FLONASE) 50 MCG/ACT nasal spray Place 2 sprays into both nostrils daily.    Marland Kitchen ipratropium-albuterol (DUONEB) 0.5-2.5 (3) MG/3ML SOLN NEBULIZE 1 VIAL EVERY 6 HOURS AS NEEDED FOR SHORTNESS OF BREATH 360 mL 1  . levothyroxine (SYNTHROID, LEVOTHROID) 25 MCG tablet Take 25 mcg by mouth daily before breakfast.   11  . Multiple Vitamins-Minerals (HAIR SKIN NAILS PO) Take  1 tablet by mouth daily.    . nicotine (NICODERM CQ - DOSED IN MG/24 HOURS) 14 mg/24hr patch Place 1 patch (14 mg total) onto the skin daily. 28 patch 0  . oxyCODONE (ROXICODONE) 15 MG immediate release tablet Take 15 mg by mouth every 6 (six) hours as needed for pain.     . OXYGEN Inhale 2.5-3 L into the lungs continuous.     . pregabalin (LYRICA) 150 MG capsule Take 150 mg by mouth 2 (two) times daily.    . psyllium (METAMUCIL) 58.6 % powder Take 1 packet by mouth daily.    . predniSONE (DELTASONE) 10 MG tablet Take 4 tablets for 3 days, 3 tablets for 3 days, 2 tablets for 3 days, 1 tablet for 3 days (Patient not taking: Reported on 10/26/2019) 30 tablet 0  . predniSONE (DELTASONE) 10 MG tablet 4 x 3 days, 3 x 3 days, 2 x 3 days, 1 x 3 days, then stop (Patient not taking: Reported on 10/26/2019) 20 tablet 0  . TRELEGY ELLIPTA 100-62.5-25 MCG/INH AEPB INHALE 1 PUFF ONCE DAILY (Patient not taking: Reported on 10/26/2019) 60 each 5   No facility-administered medications prior to visit.    Review of Systems  Review of Systems  Constitutional: Positive for chills and diaphoresis. Negative for fever.  HENT: Positive for congestion and sore throat. Negative for postnasal drip and sinus pressure.   Respiratory: Positive for cough, shortness of breath and wheezing.   Cardiovascular: Negative.   Gastrointestinal: Negative.   Neurological: Positive for headaches.   Physical Exam  BP 96/64 (BP Location: Left Arm, Cuff Size: Normal)   Pulse 96   Temp 97.6 F (36.4 C) (Temporal)   Ht 5\' 4"  (1.626 m)   Wt 120 lb (54.4 kg)   SpO2 97%   BMI 20.60 kg/m  Physical Exam Constitutional:      General: She is not in acute distress.    Appearance: Normal appearance. She is ill-appearing. She is not diaphoretic.  HENT:     Head: Normocephalic and atraumatic.     Mouth/Throat:     Comments: Deferred d/t masking Cardiovascular:     Rate and Rhythm: Normal rate and regular rhythm.  Pulmonary:      Effort: Pulmonary effort is normal.     Breath sounds: Wheezing and rhonchi present.  Musculoskeletal:        General: Normal range of motion.  Skin:  General: Skin is warm and dry.  Neurological:     General: No focal deficit present.     Mental Status: She is alert and oriented to person, place, and time. Mental status is at baseline.  Psychiatric:        Mood and Affect: Mood normal.        Behavior: Behavior normal.        Thought Content: Thought content normal.        Judgment: Judgment normal.      Lab Results:  CBC    Component Value Date/Time   WBC 7.0 07/12/2018 1146   RBC 4.80 07/12/2018 1146   HGB 15.4 (H) 07/12/2018 1146   HCT 48.3 (H) 07/12/2018 1146   PLT 293 07/12/2018 1146   MCV 100.6 (H) 07/12/2018 1146   MCH 32.1 07/12/2018 1146   MCHC 31.9 07/12/2018 1146   RDW 13.2 07/12/2018 1146   LYMPHSABS 3.7 05/24/2018 1121   MONOABS 1.0 05/24/2018 1121   EOSABS 0.1 05/24/2018 1121   BASOSABS 0.1 05/24/2018 1121    BMET    Component Value Date/Time   NA 142 07/12/2018 1146   K 4.0 07/12/2018 1146   CL 107 07/12/2018 1146   CO2 29 07/12/2018 1146   GLUCOSE 91 07/12/2018 1146   BUN 15 07/12/2018 1146   CREATININE 0.70 07/12/2018 1146   CALCIUM 8.9 07/12/2018 1146   GFRNONAA >60 07/12/2018 1146   GFRAA >60 07/12/2018 1146    BNP No results found for: BNP  ProBNP No results found for: PROBNP  Imaging: No results found.   Assessment & Plan:   COPD (chronic obstructive pulmonary disease) (HCC) - Frequent exacerbations, most recently treated last week with zpack/prednisone  - FEV1 0.8 (30%), ratio 30 +BD response  - Changing TRELEGY to BREZTRI two puffs twice daily (if not covered, may qualify for Trelegy 200) - Depo-medrol 80mg  IM x1 in office - Take second prednisone taper as prescribed per Dr. Lamonte Sakai  - Needs CXR and COVID testing  - FU in 4 weeks (consider adding daliresp if not already tried)   Chronic respiratory failure with hypoxia  ([place]) - Stable; O2 97% 3L today  - Continues 2-3L oxygen at baseline  - No increased demand    Martyn Ehrich, NP 10/26/2019

## 2019-10-26 NOTE — Assessment & Plan Note (Addendum)
-   Stable; O2 97% 3L today  - Continues 2-3L oxygen at baseline  - No increased demand

## 2019-10-26 NOTE — Addendum Note (Signed)
Addended by: Mathis Bud on: 10/26/2019 11:36 AM   Modules accepted: Orders

## 2019-10-26 NOTE — Progress Notes (Signed)
Please let patient know CXR looked fine. No acute findings or evidence of pneumonia. Lungs hyperinflated with emphysema.

## 2019-10-28 ENCOUNTER — Ambulatory Visit: Payer: Medicare HMO

## 2019-10-28 NOTE — Progress Notes (Addendum)
Subjective Patient presents to [facility] and seen by the pharmacist for smoking cessation counseling.   Patient was referred and last seen by Gloria Pitter, NP, on 10/26/19.  PMH includes history of tobacco abuse, COPD, GERD, hypothyroidism, anxiety. She is currently taking bupropion for smoking cessation. Chantix costs $47 for 30-day supply. NRT are not covered via patient's insurance.   Patient presents for initial appt with pharmacy team with her friend, Gloria Calderon (permission given). Patient reports she has had multiple life stressors since 07/2019 and experiences significant anxiety on a routine basis. She wants to quit, but finds it challenging due to the amount of anxiety she experiences. She lives with her parents who smokes in the house and are not interested in quitting.  Social History   Tobacco Use  Smoking Status Current Every Day Smoker  . Packs/day: 1.25  . Years: 30.00  . Pack years: 37.50  . Types: Cigarettes, E-cigarettes  Smokeless Tobacco Never Used  Tobacco Comment   trying to quit     Tobacco Use History  Age when started using tobacco on a daily basis at 17 years. Her parents smoked in the house.   Type: cigarettes and vape.  Number of cigarettes per day 1 or more packs per day, brand [place] 100.   Smokes first cigarette 20 minutes after waking.  Does not wake at night to smoke  Triggers include eating chocolate, stress, coffee.  Quit Attempt History   Most recent quit attempt end of September.  Longest time ever been tobacco free 4-5 days.  Methods tried in the past include   Chantix (mood swings)  Nictotine patch 21 mg (anxiety); important to note 14 mg patch worked well for her. Patient was smoking >1 PPD while on nicotine patch 21 mg daily.  Rates IMPORTANCE of quitting tobacco on 1-10 scale of 10.  Rates READINESS of quitting tobacco on 1-10 scale of 10.  Rates CONFIDENCE of quitting tobacco on 1-10 scale of 6.  Motivators to  quitting include tired of being sick, using oxygen, coughing; barriers include friends smoke    Immunization History  Administered Date(s) Administered  . Influenza Split 08/15/2013, 05/26/2019  . Influenza,inj,Quad PF,6+ Mos 05/25/2014, 07/22/2017, 07/08/2018  . Pneumococcal Polysaccharide-23 07/30/2015, 07/22/2017     Assessment and Plan  1. Smoking Cessation  a. Provided information on 1 800-QUIT NOW support program.  b. Non-Pharmacologic i. Provided patient with list of hobbies to try while quitting smoking. Patient will choose 3-4 hobbies on list. She is interested in using cinnamon toothpicks. c. Pharmacologic i. Initiated nicotine replacement tx with Chantix, nicotine patch 21 mg daily, and nicotine gum/lozenge 4 mg daily. Patient counseled on purpose, proper use, and potential adverse effects. Advised patient she likely experienced anxiety on 21 mg strength of nicotine patch previously due to smoking additional cigarettes. Patient states she will try not to do this moving forward. Patient will obtain NRT from quitline. She will attempt to receive Chantix from quitline, but if not possible then she is okay with $47 copay. Patient instructed to follow up with pharmacy team if she has any issues obtaining smoking cessation agents via [place]. 1. Nicotine Patch a. Patient counseled on purpose, proper use, and potential adverse effects, including mild itching or redness at the point of application, headache, trouble sleeping, and/or vivid dreams    b. Patch Schedule for >10 cigarettes daily i. Weeks 1-6: one 21 mg patch daily ii. Weeks 7-8: one 14 mg patch daily iii. Weeks 9-10: one 7  mg patch daily 2. Nicotine Lozenge a. Patient counseled on purpose, proper use, and potential adverse effects including nausea, hiccups, cough, and heartburn.  Instructed patient to use  2 mg unless the smoke within 30 minutes of waking up in which they should use 4 mg. b. Lozenge dosing schedule i.  Weeks 1 to 6: 1 lozenge every 1 to 2 hours (maximum: 5 lozenges every 6 hours; 20 lozenges/day); to increase chances of quitting, use at least 9 lozenges/day during the first 6 weeks ii. Weeks 7 to 9: 1 lozenge every 2 to 4 hours (maximum: 5 lozenges every 6 hours; 20 lozenges/day) iii. Weeks 10 to 12: 1 lozenge every 4 to 8 hours (maximum: 5 lozenges every 6 hours; 20 lozenges/day) 3. Nicotine Gum a. Patient counseled on purpose, proper use, and potential adverse effects including jaw soreness and upset stomach if swallowing saliva.  Instructed patient to use 4 mg if they smoke a pack a day or more and 2 mg if they smoke less than a pack a day. b. Gum dosing schedule i. Weeks 1 to 6: Chew 1 piece of gum every 1 to 2 hours (maximum: 24 pieces/day); to increase chances of quitting, chew at least 9 pieces/day during the first 6 weeks ii. Weeks 7 to 9: Chew 1 piece of gum every 2 to 4 hours (maximum: 24 pieces/day) iii. Weeks 10 to 12: Chew 1 piece of gum every 4 to 8 hours (maximum: 24 pieces/day) 4. Varenicline a. Initiated varenicline titration of 0.5 mg by mouth once daily with food x3 days, then 0.5 mg by mouth twice daily with food x4 days, then 1 mg by mouth twice daily with food thereafter.  CrCL greater than 30 mL/min. Patient counseled on purpose, proper use, and potential adverse effects, including GI upset, and potential change in mood.  2. Medication Reconciliation a. A drug regimen assessment was performed, including review of allergies, interactions, disease-state management, dosing and immunization history. Medications were reviewed with the patient, including name, instructions, indication, goals of therapy, potential side effects, importance of adherence, and safe use.  b. Initiated i. Patient takes Gloria Calderon Powder (APAP - aspirin - caffeine) on a daily basis c. Discontinued i. Patient self-D/C Gloria Calderon and re-started Trelegy Ellipta. She states she used Librarian, academic for 4 days and was not as  effective as Theme park manager. She states she has not told her provider, Gloria Pitter, NP. Will relay information to Gloria Pitter, NP.  3. Immunizations a. Patient is due for her annual influenza vaccine.  She is up-to-date with pneumonia vaccine.  She is eligible for the Shingrix vaccine. Unable to address at appt due to time constraints - will address at follow up.  This appointment required60 minutes of patient care (this includes precharting, chart review, review of results, face-to-face care, etc.).  Thank you for involving pharmacy to assist in providing this patient's care.   Drexel Iha, PharmD PGY2 Ambulatory Care Pharmacy Resident

## 2019-10-31 ENCOUNTER — Telehealth: Payer: Self-pay | Admitting: Primary Care

## 2019-10-31 ENCOUNTER — Telehealth: Payer: Self-pay | Admitting: Pharmacist

## 2019-10-31 ENCOUNTER — Other Ambulatory Visit: Payer: Self-pay

## 2019-10-31 ENCOUNTER — Ambulatory Visit (INDEPENDENT_AMBULATORY_CARE_PROVIDER_SITE_OTHER): Payer: Medicare Other | Admitting: Pharmacist

## 2019-10-31 DIAGNOSIS — Z720 Tobacco use: Secondary | ICD-10-CM

## 2019-10-31 DIAGNOSIS — F17210 Nicotine dependence, cigarettes, uncomplicated: Secondary | ICD-10-CM | POA: Diagnosis not present

## 2019-10-31 DIAGNOSIS — J441 Chronic obstructive pulmonary disease with (acute) exacerbation: Secondary | ICD-10-CM

## 2019-10-31 MED ORDER — BREZTRI AEROSPHERE 160-9-4.8 MCG/ACT IN AERO: 2.0000 | INHALATION_SPRAY | Freq: Two times a day (BID) | RESPIRATORY_TRACT | 0 refills | Status: DC

## 2019-10-31 MED ORDER — CHANTIX STARTING MONTH PAK 0.5 MG X 11 & 1 MG X 42 PO TABS: ORAL_TABLET | ORAL | 0 refills | Status: DC

## 2019-10-31 MED ORDER — VARENICLINE TARTRATE 1 MG PO TABS: 1.0000 mg | ORAL_TABLET | Freq: Two times a day (BID) | ORAL | 11 refills | Status: DC

## 2019-10-31 NOTE — Telephone Encounter (Signed)
Called patient on 10/31/2019 at 1:21 PM   Notified patient about insurance copays ($47 for Chantix, NRT not covered). Patient called [place] [facility]. [place] [facility] will cover NRT however will not cover Chantix. Will send rx for Chantix to patient's preferred pharmacy. Patient will obtain NRT via Quitline.  Thank you for involving pharmacy to assist in providing this patient's care.   Drexel Iha, PharmD PGY2 Ambulatory Care Pharmacy Resident

## 2019-10-31 NOTE — Patient Instructions (Addendum)
It was a pleasure seeing you in clinic today Ms. Rauber!  Today the plan is... 1. Wait for [facility] to call you to figure out your copays for smoking cessation medications. 2. Once [facility] has called you then please call [place] [facility] [phone] to get set up with with [place] [facility] support and to get smoking medications. 3. Please work on replacing your habits with new ones to help you quit smoking!  If you use nicotine lozenge or gum the schedule is listed below.Marland Kitchen a. Lozenge dosing schedule i. Weeks 1 to 6: 1 lozenge every 1 to 2 hours (maximum: 5 lozenges every 6 hours; 20 lozenges/day); to increase chances of quitting, use at least 9 lozenges/day during the first 6 weeks ii. Weeks 7 to 9: 1 lozenge every 2 to 4 hours (maximum: 5 lozenges every 6 hours; 20 lozenges/day) iii. Weeks 10 to 12: 1 lozenge every 4 to 8 hours (maximum: 5 lozenges every 6 hours; 20 lozenges/day) b. Gum dosing schedule i. Weeks 1 to 6: Chew 1 piece of gum every 1 to 2 hours (maximum: 24 pieces/day); to increase chances of quitting, chew at least 9 pieces/day during the first 6 weeks ii. Weeks 7 to 9: Chew 1 piece of gum every 2 to 4 hours (maximum: 24 pieces/day) iii. Weeks 10 to 12: Chew 1 piece of gum every 4 to 8 hours (maximum: 24 pieces/day)   Please call the PharmD clinic at [phone] if you have any questions that you would like to speak with a pharmacist about [facility] Kidney, Museum/gallery conservator).

## 2019-10-31 NOTE — Addendum Note (Signed)
Addended by: Ellwood Handler on: 10/31/2019 01:21 PM   Modules accepted: Orders

## 2019-10-31 NOTE — Addendum Note (Signed)
Addended by: Ellwood Handler on: 10/31/2019 01:28 PM   Modules accepted: Orders

## 2019-10-31 NOTE — Telephone Encounter (Signed)
Patient self stopped breztri and resumed Trelegy. She used Breztri for 4 days and did not feel it was as effective.

## 2019-11-08 ENCOUNTER — Other Ambulatory Visit: Payer: Self-pay | Admitting: *Deleted

## 2019-11-10 ENCOUNTER — Telehealth: Payer: Self-pay | Admitting: Emergency Medicine

## 2019-11-10 DIAGNOSIS — J441 Chronic obstructive pulmonary disease with (acute) exacerbation: Secondary | ICD-10-CM

## 2019-11-10 DIAGNOSIS — J431 Panlobular emphysema: Secondary | ICD-10-CM

## 2019-11-10 NOTE — Telephone Encounter (Signed)
Spoke with the pt  She is needing a PFT done for [facility] forms to be completed for ignition interlock  Last PFT done Nov 2020  She wants this done at [facility] since [facility] is booked until mid May 2021  She understands the test is more expensive to be done there  Please advise if okay to order,  thanks

## 2019-11-11 NOTE — Telephone Encounter (Signed)
PFT ordered for [facility] per pt request  LMTCB to let her know

## 2019-11-11 NOTE — Telephone Encounter (Signed)
Ok to order as she has requested

## 2019-11-14 NOTE — Telephone Encounter (Signed)
Spoke with pt, aware of recs. Pt states she is scheduled for a Covid test on 3/5 at [facility]. Pt also notes that she only needs a spirometry and not a full PFT.  Was told by tech at [facility] that we should be able to do this in office for her. I advised pt to keep her Covid test, and that I would check with my manager to see the status of in-office spirometries and would call her back to schedule.  Pt expressed understanding.  [name] please advise if we are still running a spiro/feno clinic, and if pt can be scheduled after covid test to have just a spirometry performed in office.  Thanks!

## 2019-11-14 NOTE — Telephone Encounter (Signed)
Patient is returning phone call.  Patient phone number is [phone].

## 2019-11-15 NOTE — Telephone Encounter (Signed)
Called and spoke with pt letting her know that we could get her scheduled for spirometry only at our office on Thursdays and pt verbalized understanding. I cancelled pt's prior appt for the covid test as well as the PFT at [facility] due to those not being needed now and scheduled pt for another covid test Mon. 3/8. I also scheduled pt for spirometry Thurs. 3/11. Nothing further needed.

## 2019-11-15 NOTE — Telephone Encounter (Signed)
This patient can be added to any Thursday spiro feno clinic. However, the testing would be on Monday of the next week. Our spiro / feno is only on thursdays at this time. Patient can cancel that covid test and get it for Monday the and schedule for the clinic in office on any Thursday after

## 2019-11-18 ENCOUNTER — Other Ambulatory Visit (HOSPITAL_COMMUNITY): Payer: Medicare Other

## 2019-11-21 ENCOUNTER — Ambulatory Visit: Payer: Medicare Other | Admitting: Emergency Medicine

## 2019-11-21 ENCOUNTER — Other Ambulatory Visit: Payer: Self-pay

## 2019-11-21 ENCOUNTER — Other Ambulatory Visit (HOSPITAL_COMMUNITY)
Admission: RE | Admit: 2019-11-21 | Discharge: 2019-11-21 | Disposition: A | Discharge status: Home / Self Care | Payer: Medicare Other | Source: Ambulatory Visit | Attending: Emergency Medicine | Admitting: Emergency Medicine

## 2019-11-21 DIAGNOSIS — Z01812 Encounter for preprocedural laboratory examination: Secondary | ICD-10-CM | POA: Insufficient documentation

## 2019-11-21 DIAGNOSIS — Z20822 Contact with and (suspected) exposure to covid-19: Secondary | ICD-10-CM | POA: Insufficient documentation

## 2019-11-22 ENCOUNTER — Encounter (HOSPITAL_COMMUNITY): Payer: Medicare Other

## 2019-11-22 LAB — SARS CORONAVIRUS 2 (TAT 6-24 HRS): SARS Coronavirus 2: NEGATIVE

## 2019-11-24 ENCOUNTER — Other Ambulatory Visit: Payer: Self-pay

## 2019-11-24 ENCOUNTER — Other Ambulatory Visit: Payer: Medicare Other

## 2019-11-28 ENCOUNTER — Ambulatory Visit: Payer: Medicare Other | Admitting: Emergency Medicine

## 2019-11-28 ENCOUNTER — Telehealth: Payer: Self-pay | Admitting: Emergency Medicine

## 2019-11-28 NOTE — Telephone Encounter (Signed)
Doesn't appear that it is doing anything. Patient needs to be actively trying to quit. Please set patient up with pharmacy consult for smoking cessation.

## 2019-11-28 NOTE — Telephone Encounter (Signed)
Beth, how long should pt take Chantix? We gave her a continuing pack with 11 refills on 10/31/2019. Please advise.

## 2019-11-30 NOTE — Telephone Encounter (Signed)
Pt called back-- please return call.  

## 2019-11-30 NOTE — Telephone Encounter (Signed)
Called and spoke with Patient. Beth,NP recommendations given.  Patient stated she had consult with pharmacy team, and they started the Chantix. Patient stated she was only wanting to make sure she could be on Chantix more then a month.  Patient stated she spoke with her pharmacy, and they advised her of refill on prescription.  Patient stated Chantix has been helping her with smoking cessation. Patient stated she has OV in April with Dr. Lamonte Sakai, and she will let him know of progress. Nothing further at this time.

## 2019-11-30 NOTE — Telephone Encounter (Signed)
ATC Patient.  Left message for Patient to call back. Patient needs pharmacy appointment for smoking cessation.

## 2019-12-21 ENCOUNTER — Telehealth: Payer: Self-pay | Admitting: Emergency Medicine

## 2019-12-21 NOTE — Telephone Encounter (Signed)
Returned call to patient and made aware message would be sent to provider for ok to send Breztri On 2/15 Beth noted that patient after 4 days d/c Breztri and went back on Trelegy. Dr. Lamonte Sakai patient has f/u with you on 4/9. Would you like Bretzi refill submitted?

## 2019-12-23 ENCOUNTER — Ambulatory Visit (INDEPENDENT_AMBULATORY_CARE_PROVIDER_SITE_OTHER): Payer: Medicare Other | Admitting: Emergency Medicine

## 2019-12-23 ENCOUNTER — Other Ambulatory Visit: Payer: Self-pay

## 2019-12-23 ENCOUNTER — Encounter: Payer: Self-pay | Admitting: Emergency Medicine

## 2019-12-23 DIAGNOSIS — J431 Panlobular emphysema: Secondary | ICD-10-CM | POA: Diagnosis not present

## 2019-12-23 NOTE — Telephone Encounter (Signed)
Discussed w her 12/23/19

## 2019-12-23 NOTE — Progress Notes (Signed)
Virtual Visit via Telephone Note  I connected with Gloria Calderon on 12/23/19 at  1:45 PM EDT by telephone and verified that I am speaking with the correct person using two identifiers.  Location: Patient: Home Provider: Office   I discussed the limitations, risks, security and privacy concerns of performing an evaluation and management service by telephone and the availability of in person appointments. I also discussed with the patient that there may be a patient responsible charge related to this service. The patient expressed understanding and agreed to proceed.   History of Present Illness: 54 year old smoker with severe COPD and bronchitic phenotype, frequent exacerbations.  She has associated hypoxemic respiratory failure, on oxygen at 2.5 to 3 L/min.. We have been managing her on Trelegy and DuoNeb as needed.  Also on fluticasone nasal spray, Nexium   Observations/Objective: Since last time we have been able to change her Trelegy to [employer]. She feels that the [name] is helping her significantly - always helps her quickly in the am, improved functional capacity, less noise and congestion. She is smoking less, hasn't quit yet - has cut down, currently only a few a day. Rare albuterol use - less than before.  No DuoNeb use.  She is on Chantix, tolerating well.  Has not had her COVID vaccine yet. She once had an allergic rxn to [place] Nuts, ? anaphylaxis  Assessment and Plan:  Plan to continue [place] as she has been taking it. Continue albuterol as needed Continue oxygen with exertion 2 to 3 L/min.  I've recommended that she get the J&J vaccine to minimize exposure since she may have had allergic rxn to Nuts before.  We discussed smoking cessation.  She is made a lot of progress.  She will continue her Chantix and use supplemental nicotine.  Her goal is to be off cigarettes completely by the next time we meet.  Follow Up Instructions: 3 months or as needed   I discussed  the assessment and treatment plan with the patient. The patient was provided an opportunity to ask questions and all were answered. The patient agreed with the plan and demonstrated an understanding of the instructions.   The patient was advised to call back or seek an in-person evaluation if the symptoms worsen or if the condition fails to improve as anticipated.  I provided 21 minutes of non-face-to-face time during this encounter.   Collene Gobble, MD

## 2019-12-26 ENCOUNTER — Telehealth: Payer: Self-pay | Admitting: Emergency Medicine

## 2019-12-26 MED ORDER — BREZTRI AEROSPHERE 160-9-4.8 MCG/ACT IN AERO: 2.0000 | INHALATION_SPRAY | Freq: Two times a day (BID) | RESPIRATORY_TRACT | 3 refills | Status: DC

## 2019-12-26 NOTE — Telephone Encounter (Signed)
Spoke with pt, she states she has to do another spirometry test to be read by [facility]. I schedule dher an appt on 01/12/2020 at 11:00 with covid testing on 01/09/2020 at 2:05pm at [facility].  I also sent in a prescription to her pharmacy for [facility].  Pt states she will bring paperwork to fill out to her appt if she has them by then. If she doesn't then she will have them mailed to [place].   She also asked about a spacer. I advised her we could give her one when she come in for the spirometry because I dont believe the pharmacy carries them. FYI Beth

## 2019-12-26 NOTE — Telephone Encounter (Signed)
ok 

## 2020-01-02 ENCOUNTER — Telehealth: Payer: Self-pay | Admitting: Emergency Medicine

## 2020-01-02 NOTE — Telephone Encounter (Signed)
Pt stated she would bring in form, its not anything extensive, she reports. Beth has filled them out before. RB can decide at her OV. Will close encounter.

## 2020-01-02 NOTE — Telephone Encounter (Signed)
Spoke with pt, she would like Dr. Lamonte Sakai to fill out the paper at her office visit when she comes to see him. She states he knows how hard of a time she has had with this. I advised her that I couldn't promise he would fill it out. RB would you be able to fill this out for pt during OV?

## 2020-01-02 NOTE — Telephone Encounter (Signed)
I'm not sure whether I can without knowing what the paperwork is-  What does she need done?

## 2020-01-09 ENCOUNTER — Other Ambulatory Visit (HOSPITAL_COMMUNITY)
Admission: RE | Admit: 2020-01-09 | Discharge: 2020-01-09 | Disposition: A | Discharge status: Home / Self Care | Payer: Medicare Other | Source: Ambulatory Visit | Attending: Otolaryngology | Admitting: Otolaryngology

## 2020-01-09 ENCOUNTER — Other Ambulatory Visit: Payer: Self-pay

## 2020-01-09 DIAGNOSIS — Z01812 Encounter for preprocedural laboratory examination: Secondary | ICD-10-CM | POA: Diagnosis present

## 2020-01-09 DIAGNOSIS — Z20822 Contact with and (suspected) exposure to covid-19: Secondary | ICD-10-CM | POA: Insufficient documentation

## 2020-01-09 LAB — SARS CORONAVIRUS 2 (TAT 6-24 HRS): SARS Coronavirus 2: NEGATIVE

## 2020-01-10 ENCOUNTER — Ambulatory Visit (INDEPENDENT_AMBULATORY_CARE_PROVIDER_SITE_OTHER): Payer: Medicare Other | Admitting: Otolaryngology

## 2020-01-11 ENCOUNTER — Other Ambulatory Visit: Payer: Self-pay | Admitting: Primary Care

## 2020-01-11 DIAGNOSIS — J431 Panlobular emphysema: Secondary | ICD-10-CM

## 2020-01-12 ENCOUNTER — Ambulatory Visit (INDEPENDENT_AMBULATORY_CARE_PROVIDER_SITE_OTHER): Payer: Medicare Other

## 2020-01-12 ENCOUNTER — Other Ambulatory Visit: Payer: Self-pay

## 2020-01-12 DIAGNOSIS — J431 Panlobular emphysema: Secondary | ICD-10-CM | POA: Diagnosis not present

## 2020-01-12 DIAGNOSIS — J9611 Chronic respiratory failure with hypoxia: Secondary | ICD-10-CM

## 2020-01-12 NOTE — Progress Notes (Signed)
Patient took Abluterol and Breztri this morning at 0845 Patient also down to half a pack or less a day

## 2020-01-13 ENCOUNTER — Telehealth: Payer: Self-pay

## 2020-01-13 NOTE — Telephone Encounter (Signed)
Pt returning call and aware forms are ready.  Requested forms to be mailed along with a copy of the spirometry tests from 4/4(?) and yesterday.  Verified address correct in her chart.  Please advise.

## 2020-01-13 NOTE — Telephone Encounter (Signed)
Made Gloria Calderon aware of the info stated by pt about the [facility] forms that pt wants them to be mailed to her. Gloria Calderon stated that she is going to place forms in the mail for pt. Nothing further needed.

## 2020-01-13 NOTE — Telephone Encounter (Signed)
Patient dropped off forms earlier this week for [facility] (see previous telephone message).  Forms have been completed to best of our ability, but it does require a signature from the pt before the form is valid.  I have left form in an envelope up front to be picked up by pt for signature before this is sent to [facility].  LMTCB X1 to let pt know that she needs to come in and sign this form.

## 2020-01-20 ENCOUNTER — Telehealth: Payer: Self-pay | Admitting: Emergency Medicine

## 2020-01-20 NOTE — Telephone Encounter (Signed)
LMTCB X1 

## 2020-01-23 ENCOUNTER — Telehealth: Payer: Self-pay | Admitting: Emergency Medicine

## 2020-01-23 NOTE — Telephone Encounter (Signed)
Spoke with pt,, she states she is missing the second round of paperwork from [employer]. There was paperwork mailed to her on 01/13/2020. She did receive most of it. I looked in Beth's folder and didn't see any paperwork. I will follow up with this in the am.

## 2020-01-24 NOTE — Telephone Encounter (Signed)
Yes, I filled it out and from what I remember I gave it to [facility] (?). I did fill it out

## 2020-01-24 NOTE — Telephone Encounter (Signed)
Patrice, do you have the form in question by chance? Per [place], this was given to you, thanks

## 2020-01-24 NOTE — Telephone Encounter (Signed)
[name] do you remember this paper. The patient stated your name in the conversation. Please advise.

## 2020-01-24 NOTE — Telephone Encounter (Signed)
ATC pt, call went straight to VM. LMTCB x2 for pt.  

## 2020-01-24 NOTE — Telephone Encounter (Signed)
Beth please advise on this paperwork. Patient came for a spiro and paperwork was handed to you that day. It was on a spirometry for the device you have to blow into to start your car.   One was given to you and one was given to Dr. Lamonte Sakai to finish filling out so we could send back to patient.  The form and the spirometry were together with an envelope.

## 2020-01-25 NOTE — Telephone Encounter (Signed)
Pt returning a phone call. Pt can be reached at [phone] 

## 2020-01-25 NOTE — Telephone Encounter (Signed)
ATC patient X3 LMTCB per protocol will close encounter.

## 2020-01-26 NOTE — Telephone Encounter (Signed)
Patient called back and she was provided with phone number for [employer] [phone].

## 2020-01-26 NOTE — Telephone Encounter (Signed)
I don't have any forms on this patient - Any forms I receive I send to [employer].-pr

## 2020-01-26 NOTE — Telephone Encounter (Signed)
ATC patient letting her know that any paperwork we receive goes to [employer] and that she would have to call them to see if they received it or for any updates. The number to [employer] is [phone]

## 2020-01-26 NOTE — Telephone Encounter (Signed)
Pt returning a phone call. Pt can be reached at [phone] 

## 2020-01-27 ENCOUNTER — Telehealth: Payer: Self-pay | Admitting: Emergency Medicine

## 2020-01-27 NOTE — Telephone Encounter (Signed)
Called and spoke with pt stating that we did not have the paperwork. She stated that she was going to rerequest the paperwork and then probably just schedule a visit with Beth to have the paperwork filled out during that visit so she can hopefully be able to get her license back soon. Nothing further needed.

## 2020-03-02 ENCOUNTER — Telehealth: Payer: Self-pay | Admitting: Emergency Medicine

## 2020-03-02 MED ORDER — PREDNISONE 10 MG PO TABS: ORAL_TABLET | ORAL | 0 refills | Status: DC

## 2020-03-02 NOTE — Telephone Encounter (Signed)
Patient calling to see if RB will prescribe her medication- patient has a lot of tightness in her chest, not much phlegm coming up. Patient has used inhalers, not working for her. Patient thinks steroids may help. PCP started pt on 5mg  prednisone and a Z pack. The prednisone was 5, 5mg  pills, not taper dose.Please advise. [phone]

## 2020-03-02 NOTE — Telephone Encounter (Signed)
Called and spoke with pt letting her know the info stated by RB and she verbalized understanding. Nothing further needed. 

## 2020-03-02 NOTE — Telephone Encounter (Signed)
Would replace the prednisone 5mg  with the following:  Pred Take 40mg  daily for 3 days, then 30mg  daily for 3 days, then 20mg  daily for 3 days, then 10mg  daily for 3 days, then stop

## 2020-03-02 NOTE — Telephone Encounter (Signed)
Called and spoke with pt letting her know that [place] said for [place] to send pred taper to pharmacy for her and she verbalized understanding.  While speaking with pt, she was wanting to know if a cough syrup could be sent in for her. She stated she has tried OTC cough syrup and that has not really helped.  Dr. Lamonte Sakai, please advise.

## 2020-03-02 NOTE — Telephone Encounter (Signed)
I think the prednisone will help the cough symptoms.

## 2020-03-02 NOTE — Telephone Encounter (Signed)
Spoke with patient. She stated that she has had an increase in her chest tightness for the past 4 weeks. She saw her PCP on Tuesday and he only prescribed a zpak and prednisone 5mg  (no taper, just prednisone 5 mg for 5 days). She feels like the zpak and prednisone are not helping.   She stated that she received her 2nd covid vaccine on May 19th. She had a productive cough then and was not feeling well but wanted to get her 2nd vaccine. On May 20th, the phlegm turned from clear to green. This lasted for about 3-4 days. Since then, her phlegm has been yellow.   She is currently using 3L of O2. She normally uses 2.5L but she increased her O2 yesterday due to the chest tightness.   She denied any fevers or chills. Also denied being around anything who has been sick recently.   She wants to know what RB recommends for her. She is also requesting cough syrup.   Pharmacy is [facility].   RB, please advise. Thanks!

## 2020-03-07 ENCOUNTER — Telehealth: Payer: Self-pay | Admitting: Emergency Medicine

## 2020-03-07 NOTE — Telephone Encounter (Signed)
ATC pt, no answer. Left message for pt to call back.  

## 2020-03-08 NOTE — Telephone Encounter (Signed)
[employer] informed her based on forms that we sent she still has to have the interlock ignition on her car at a reduced lung capacity setting. She wants to know what Dr Lamonte Sakai thinks about this since he was "dead set against her having one at all".  Please advise, thanks!

## 2020-03-08 NOTE — Telephone Encounter (Signed)
Pt returning a phone call. Pt can be reached at [phone]

## 2020-03-08 NOTE — Telephone Encounter (Signed)
LMTCB x1 for pt.  

## 2020-03-08 NOTE — Telephone Encounter (Signed)
Pt returning a phone call. Pt can be reached at [phone].

## 2020-03-08 NOTE — Telephone Encounter (Signed)
LMTCB x2 for pt 

## 2020-03-09 NOTE — Telephone Encounter (Signed)
I think we filled out the forms accurately. If they require her to use an interlock, there may not be anything we can do to prevent it.

## 2020-03-09 NOTE — Telephone Encounter (Signed)
Patient contacted and Dr. Agustina Caroli message relayed. Patient may appeal the decision. Nothing further needed.

## 2020-04-17 ENCOUNTER — Telehealth: Payer: Self-pay | Admitting: Emergency Medicine

## 2020-04-17 NOTE — Telephone Encounter (Signed)
Pharmacy please advise on benefits investigation for inhalers. Thank you

## 2020-04-18 MED ORDER — BREZTRI AEROSPHERE 160-9-4.8 MCG/ACT IN AERO: 2.0000 | INHALATION_SPRAY | Freq: Two times a day (BID) | RESPIRATORY_TRACT | 11 refills | Status: DC

## 2020-04-18 MED ORDER — BREZTRI AEROSPHERE 160-9-4.8 MCG/ACT IN AERO: 2.0000 | INHALATION_SPRAY | Freq: Two times a day (BID) | RESPIRATORY_TRACT | 0 refills | Status: DC

## 2020-04-18 NOTE — Telephone Encounter (Signed)
Spoke with patient and advised of the outcome of pharmacy investigation regarding prices of inhalers. I let her know that I printed out an application for the patient assistance program for the manufacturer of Breztri and a prescription and will have Dr. Lamonte Sakai sign them when he returns to the office.  She can fill out her portion and bring her financial documents for [place] to fax when she comes to her 8/9 appointment.  I will put samples out front for her to pick up while we work on the application.

## 2020-04-18 NOTE — Telephone Encounter (Signed)
Ran test claims for 1 month supply.  Breztri- $157.88  Trelegy- $160.94  Patient has Medicare D plan and is currently in the coverage gap. Patient should apply to patient assistance.

## 2020-04-18 NOTE — Addendum Note (Signed)
Addended by: Vanessa Barbara on: 04/18/2020 01:08 PM   Modules accepted: Orders

## 2020-04-23 ENCOUNTER — Ambulatory Visit: Payer: Medicare Other | Admitting: Emergency Medicine

## 2020-04-23 ENCOUNTER — Other Ambulatory Visit: Payer: Self-pay

## 2020-04-23 ENCOUNTER — Encounter: Payer: Self-pay | Admitting: Emergency Medicine

## 2020-04-23 DIAGNOSIS — Z720 Tobacco use: Secondary | ICD-10-CM | POA: Diagnosis not present

## 2020-04-23 DIAGNOSIS — J431 Panlobular emphysema: Secondary | ICD-10-CM | POA: Diagnosis not present

## 2020-04-23 MED ORDER — BREZTRI AEROSPHERE 160-9-4.8 MCG/ACT IN AERO: 2.0000 | INHALATION_SPRAY | Freq: Two times a day (BID) | RESPIRATORY_TRACT | 0 refills | Status: DC

## 2020-04-23 NOTE — Progress Notes (Signed)
Subjective:    Patient ID: Gloria Calderon, female    DOB: Dec 11, 1965, 54 y.o.   MRN: [id number]  COPD She complains of cough, shortness of breath and wheezing. Associated symptoms include postnasal drip. Pertinent negatives include no appetite change, ear pain, fever, headaches, rhinorrhea, sneezing, sore throat or trouble swallowing. Her past medical history is significant for COPD.   ROV 07/04/2019 --this is a follow-up visit for 54 year old active smoker with severe COPD and associated hypoxemic respiratory failure.  She has frequent exacerbations, last was February 2020. She is currently managed on Trelegy.  Uses DuoNeb about 1-2x a day, or albuterol approximately 3-5 x a day. She is currently smoking approximately about 5-10 cig a day. Still wants to stop - not really ready to set a quit date. She has been on wellbutrin.  Flu shot today.   ROV 04/23/20 --55 year old active smoker with severe COPD and hypoxemic respiratory failure.  I last spoke with her 12/23/2019 by phone visit.  She is on Breztri and DuoNeb as needed, using approximately once a week. She is working on Dietitian assistance from the company for the [employer]. If unable she would be willing to go back to Trelegy She has been on Chantix but there is a shortage right now so she has not taken in 2 months, currently smoking approximately 5 cig a day. She was treated with pred taper in June and benefited, back to baseline.  She has had the COVID vaccine.     Review of Systems  Constitutional: Negative for appetite change, fever and unexpected weight change.  HENT: Positive for congestion and postnasal drip. Negative for dental problem, ear pain, nosebleeds, rhinorrhea, sinus pressure, sneezing, sore throat and trouble swallowing.   Eyes: Negative for redness and itching.  Respiratory: Positive for cough, chest tightness, shortness of breath and wheezing.   Cardiovascular: Negative for palpitations and leg swelling.   Gastrointestinal: Negative for abdominal pain, nausea and vomiting.  Genitourinary: Negative for dysuria.  Musculoskeletal: Negative for joint swelling.  Skin: Negative for rash.  Neurological: Negative for headaches.  Hematological: Does not bruise/bleed easily.  Psychiatric/Behavioral: Negative for dysphoric mood. The patient is not nervous/anxious.        Objective:   Physical Exam Vitals:   04/23/20 1525  BP: 134/76  Pulse: (!) 101  Temp: 97.7 F (36.5 C)  TempSrc: Temporal  SpO2: 97%  Weight: 124 lb 12.8 oz (56.6 kg)  Height: 5\' 4"  (1.626 m)   Gen: Pleasant, well-nourished, in no distress, ill appearing  ENT: No lesions,  mouth clear,  oropharynx clear, nasal congestion, strong voice  Neck: No JVD, no stridor  Lungs: No use of accessory muscles, soft B exp wheezes  Cardiovascular: RRR, heart sounds normal, no murmur or gallops, no peripheral edema  Musculoskeletal: No deformities, no cyanosis or clubbing  Neuro: alert, non focal  Skin: Warm, no lesions or rashes      Assessment & Plan:  COPD (chronic obstructive pulmonary disease) ([place]) As per our previous discussions and the paperwork completed at previous visit, I do not believe you are going to be able to activate the interlock system.  Your FEV1 is 0.70 L on her most recent spirometry.  I will provide you with copies of your spirometry to take for documentation. Continue Breztri 2 puffs twice a day.  Rinse and gargle after using.  If we are unable to get financial assistance for this then we may decide to change back to Trelegy. Keep DuoNeb available to  use if needed for shortness of breath, chest tightness, wheezing. Covid vaccine is up-to-date Follow with Dr Lamonte Sakai in 4 months or sooner if you have any problems.  Tobacco abuse Congratulations on decreasing your smoking.  We will continue to try to cut down and ultimately work on setting a quit date. Agree with restarting Chantix when it is  available   Baltazar Apo, MD, PhD 04/23/2020, 3:54 PM [facility] [phone] or if no answer [phone]

## 2020-04-23 NOTE — Assessment & Plan Note (Signed)
Congratulations on decreasing your smoking.  We will continue to try to cut down and ultimately work on setting a quit date. Agree with restarting Chantix when it is available

## 2020-04-23 NOTE — Assessment & Plan Note (Signed)
As per our previous discussions and the paperwork completed at previous visit, I do not believe you are going to be able to activate the interlock system.  Your FEV1 is 0.70 L on her most recent spirometry.  I will provide you with copies of your spirometry to take for documentation. Continue Breztri 2 puffs twice a day.  Rinse and gargle after using.  If we are unable to get financial assistance for this then we may decide to change back to Trelegy. Keep DuoNeb available to use if needed for shortness of breath, chest tightness, wheezing. Covid vaccine is up-to-date Follow with Dr Lamonte Sakai in 4 months or sooner if you have any problems.

## 2020-04-23 NOTE — Patient Instructions (Addendum)
As per our previous discussions and the paperwork completed at previous visit, I do not believe you are going to be able to activate the interlock system.  Your FEV1 is 0.70 L on her most recent spirometry.  I will provide you with copies of your spirometry to take for documentation. Continue Breztri 2 puffs twice a day.  Rinse and gargle after using.  If we are unable to get financial assistance for this then we may decide to change back to Trelegy. Keep DuoNeb available to use if needed for shortness of breath, chest tightness, wheezing. Congratulations on decreasing your smoking.  We will continue to try to cut down and ultimately work on setting a quit date. Agree with restarting Chantix when it is available Covid vaccine is up-to-date Follow with Dr Lamonte Sakai in 4 months or sooner if you have any problems.

## 2020-05-02 ENCOUNTER — Telehealth: Payer: Self-pay | Admitting: Emergency Medicine

## 2020-05-02 NOTE — Telephone Encounter (Signed)
Patient calling about Chantix recall. Patient instructed to stop Chantix she is currently taking. She verbalized understanding of recommendations. She is going to call her pharmacy for further questions about the recall.

## 2020-05-07 ENCOUNTER — Telehealth: Payer: Self-pay | Admitting: Emergency Medicine

## 2020-05-07 NOTE — Telephone Encounter (Signed)
Left message for patient

## 2020-05-07 NOTE — Telephone Encounter (Signed)
Called and spoke with patient she states that since yesterday she has had a headache, productive cough with clear sputum and just feels pressure in her sinuses and head/chest, and states that she hurts all over. No vaccines, does not have anything to check temp. Would like to see if something can be sent into her pharmacy.  Dr. Lamonte Sakai please advise

## 2020-05-07 NOTE — Telephone Encounter (Signed)
This sounds like a viral syndrome. There is not much to do at this time besides symptomatic relief - recommend Tylenol Cold and Flu. If sx persist then she needs to call us back because we may need to refer her for COVID testing, considser treating her for COPD flare.

## 2020-05-07 NOTE — Telephone Encounter (Signed)
Called spoke with patient. Let her know Dr. Sudie Bailey recommendations. She said she would call back if she is not better.   Nothing further needed at this time.

## 2020-07-05 ENCOUNTER — Telehealth: Payer: Self-pay | Admitting: Emergency Medicine

## 2020-07-05 MED ORDER — PREDNISONE 10 MG PO TABS: ORAL_TABLET | ORAL | 0 refills | Status: DC

## 2020-07-05 NOTE — Telephone Encounter (Signed)
Spoke with pt, aware of recs.  rx sent to preferred pharmacy.  Nothing further needed at this time- will close encounter.   

## 2020-07-05 NOTE — Telephone Encounter (Signed)
Pt believes she is having a copd exacerbation- c/o increased sob, intermittent prod cough with white/clear mucus since Monday.  Denies fever, sharp chest pains, loss of taste/smell, headache, discolored mucus, sinus congestion.    Pt notes that she exerted a lot on Monday and believes she "overdid it".  Requesting pred taper, additional recs if available.    Pharmacy: [facility] please advise on recs- thanks!

## 2020-07-05 NOTE — Telephone Encounter (Signed)
I agree with pred taper:  Take 40mg  daily for 3 days, then 30mg  daily for 3 days, then 20mg  daily for 3 days, then 10mg  daily for 3 days, then stop

## 2020-07-08 IMAGING — CR DG CHEST 2V
2 series · 2 of 2 positions shown · non-contrast
Comparison: 07/20/2017

CLINICAL DATA: COPD.  Preop.  Back surgery.

EXAM:
CHEST - 2 VIEW

[w chest pa]
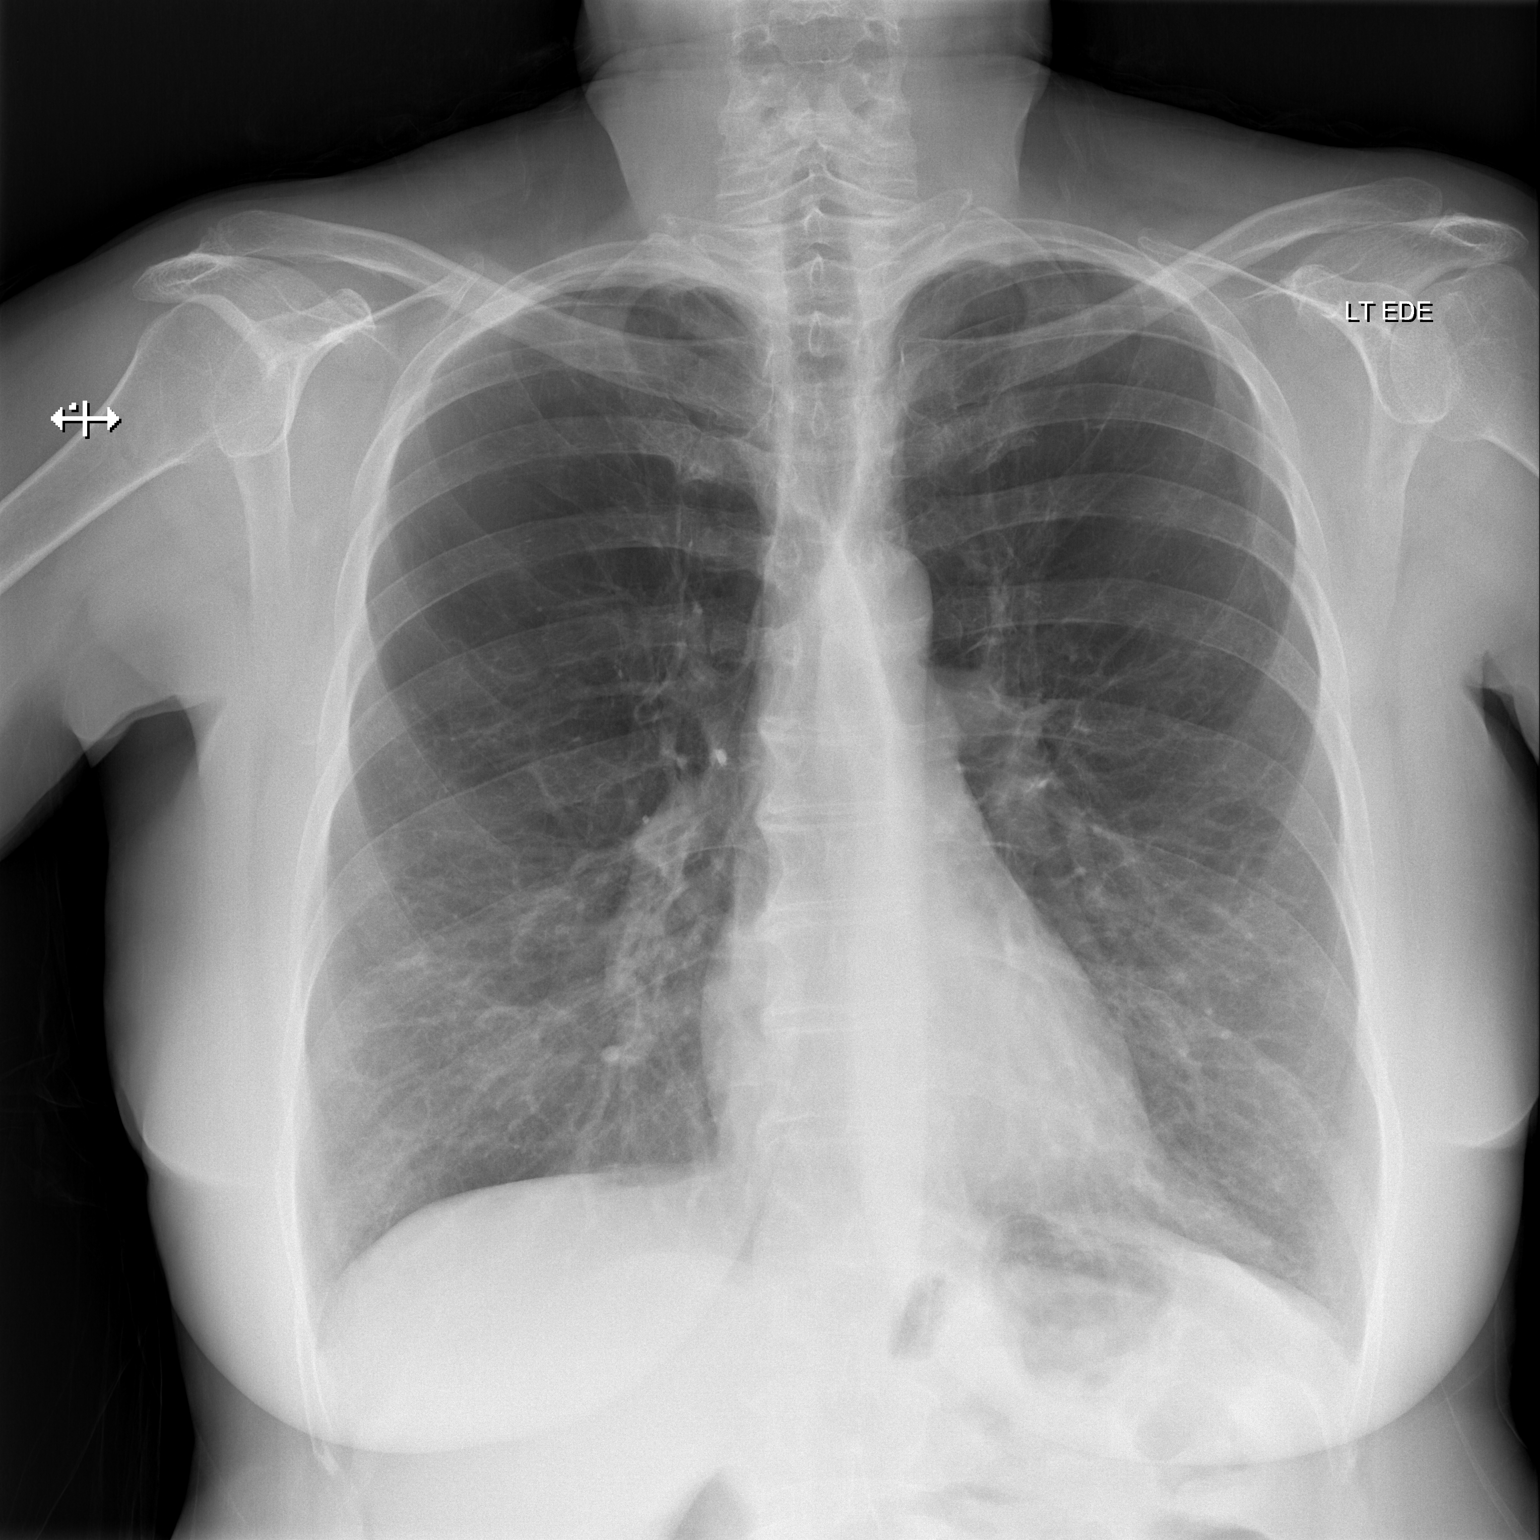

[w chest lat]
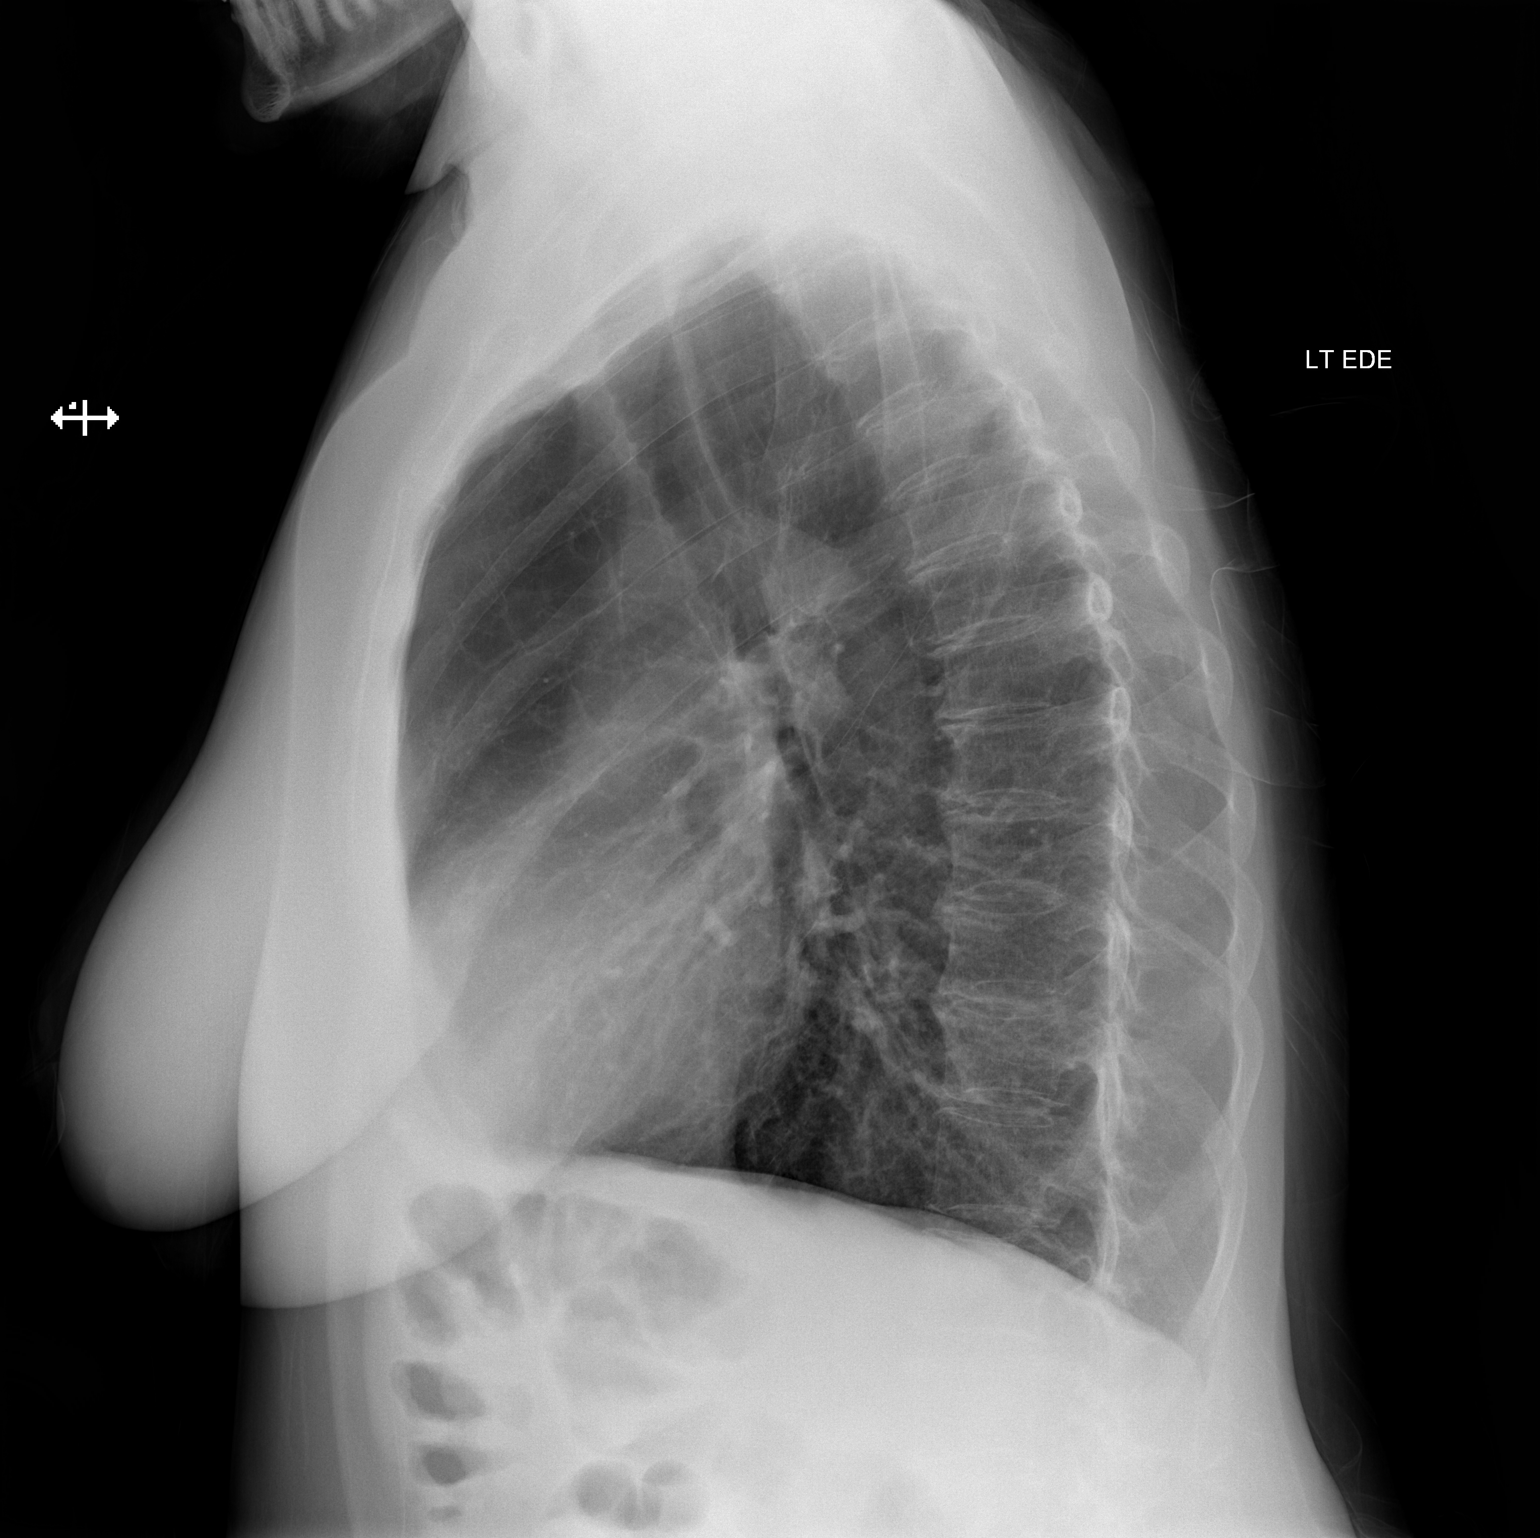

[2 of 2 positions shown; findings below may reference images not displayed]

FINDINGS: Lungs remain hyperaerated and clear. No pneumothorax or pleural
effusion. Normal heart size.
IMPRESSION: No active cardiopulmonary disease.

## 2020-10-22 ENCOUNTER — Other Ambulatory Visit: Payer: Self-pay

## 2020-10-22 ENCOUNTER — Emergency Department (HOSPITAL_COMMUNITY)
Admission: EM | Admit: 2020-10-22 | Discharge: 2020-10-24 | Disposition: A | Discharge status: Home / Self Care | Payer: Medicare HMO | Attending: Emergency Medicine | Admitting: Emergency Medicine

## 2020-10-22 ENCOUNTER — Encounter (HOSPITAL_COMMUNITY): Payer: Self-pay | Admitting: Emergency Medicine

## 2020-10-22 DIAGNOSIS — T510X2A Toxic effect of ethanol, intentional self-harm, initial encounter: Secondary | ICD-10-CM | POA: Diagnosis not present

## 2020-10-22 DIAGNOSIS — T424X1A Poisoning by benzodiazepines, accidental (unintentional), initial encounter: Secondary | ICD-10-CM | POA: Insufficient documentation

## 2020-10-22 DIAGNOSIS — J449 Chronic obstructive pulmonary disease, unspecified: Secondary | ICD-10-CM | POA: Insufficient documentation

## 2020-10-22 DIAGNOSIS — Z7951 Long term (current) use of inhaled steroids: Secondary | ICD-10-CM | POA: Insufficient documentation

## 2020-10-22 DIAGNOSIS — Z79899 Other long term (current) drug therapy: Secondary | ICD-10-CM | POA: Diagnosis not present

## 2020-10-22 DIAGNOSIS — E039 Hypothyroidism, unspecified: Secondary | ICD-10-CM | POA: Insufficient documentation

## 2020-10-22 DIAGNOSIS — F17290 Nicotine dependence, other tobacco product, uncomplicated: Secondary | ICD-10-CM | POA: Insufficient documentation

## 2020-10-22 DIAGNOSIS — T1491XA Suicide attempt, initial encounter: Secondary | ICD-10-CM | POA: Diagnosis present

## 2020-10-22 DIAGNOSIS — J45909 Unspecified asthma, uncomplicated: Secondary | ICD-10-CM | POA: Diagnosis not present

## 2020-10-22 DIAGNOSIS — Z20822 Contact with and (suspected) exposure to covid-19: Secondary | ICD-10-CM | POA: Insufficient documentation

## 2020-10-22 DIAGNOSIS — X58XXXA Exposure to other specified factors, initial encounter: Secondary | ICD-10-CM | POA: Insufficient documentation

## 2020-10-22 DIAGNOSIS — F332 Major depressive disorder, recurrent severe without psychotic features: Secondary | ICD-10-CM | POA: Diagnosis not present

## 2020-10-22 DIAGNOSIS — F17210 Nicotine dependence, cigarettes, uncomplicated: Secondary | ICD-10-CM | POA: Diagnosis not present

## 2020-10-22 DIAGNOSIS — T50901A Poisoning by unspecified drugs, medicaments and biological substances, accidental (unintentional), initial encounter: Secondary | ICD-10-CM

## 2020-10-22 LAB — COMPREHENSIVE METABOLIC PANEL
ALT: 16 U/L (ref 0–44)
AST: 15 U/L (ref 15–41)
Albumin: 4.2 g/dL (ref 3.5–5.0)
Alkaline Phosphatase: 64 U/L (ref 38–126)
Anion gap: 7 (ref 5–15)
BUN: 14 mg/dL (ref 6–20)
CO2: 28 mmol/L (ref 22–32)
Calcium: 9.1 mg/dL (ref 8.9–10.3)
Chloride: 106 mmol/L (ref 98–111)
Creatinine, Ser: 0.7 mg/dL (ref 0.44–1.00)
GFR, Estimated: >60 mL/min (ref >60)
Glucose, Bld: 94 mg/dL (ref 70–99)
Potassium: 4.2 mmol/L (ref 3.5–5.1)
Sodium: 141 mmol/L (ref 135–145)
Total Bilirubin: 0.3 mg/dL (ref 0.3–1.2)
Total Protein: 7.4 g/dL (ref 6.5–8.1)

## 2020-10-22 LAB — CBC WITH DIFFERENTIAL/PLATELET
Abs Immature Granulocytes: 0.02 10*3/uL (ref 0.00–0.07)
Basophils Absolute: 0.1 10*3/uL (ref 0.0–0.1)
Basophils Relative: 1 %
Eosinophils Absolute: 0.2 10*3/uL (ref 0.0–0.5)
Eosinophils Relative: 3 %
HCT: 51.1 % — ABNORMAL HIGH (ref 36.0–46.0)
Hemoglobin: 16.8 g/dL — ABNORMAL HIGH (ref 12.0–15.0)
Immature Granulocytes: 0 %
Lymphocytes Relative: 25 %
Lymphs Abs: 1.9 10*3/uL (ref 0.7–4.0)
MCH: 33.1 pg (ref 26.0–34.0)
MCHC: 32.9 g/dL (ref 30.0–36.0)
MCV: 100.8 fL — ABNORMAL HIGH (ref 80.0–100.0)
Monocytes Absolute: 0.4 10*3/uL (ref 0.1–1.0)
Monocytes Relative: 5 %
Neutro Abs: 5.3 10*3/uL (ref 1.7–7.7)
Neutrophils Relative %: 66 %
Platelets: 304 10*3/uL (ref 150–400)
RBC: 5.07 MIL/uL (ref 3.87–5.11)
RDW: 12.8 % (ref 11.5–15.5)
WBC: 7.9 10*3/uL (ref 4.0–10.5)
nRBC: 0 % (ref 0.0–0.2)

## 2020-10-22 LAB — RESP PANEL BY RT-PCR (FLU A&B, COVID) ARPGX2
Influenza A by PCR: NEGATIVE
Influenza B by PCR: NEGATIVE
SARS Coronavirus 2 by RT PCR: NEGATIVE

## 2020-10-22 LAB — ETHANOL: Alcohol, Ethyl (B): 170 mg/dL — ABNORMAL HIGH (ref <10)

## 2020-10-22 MED ORDER — LACTATED RINGERS IV BOLUS
2000.0000 mL | Freq: Once | INTRAVENOUS | Status: AC
  Administered 2020-10-23: 2000 mL via INTRAVENOUS

## 2020-10-22 NOTE — ED Triage Notes (Addendum)
Pt arrives via [facility] after intentional overdose. Per pts family she has been drinking ETOH today and took an unknown amount of oxycodone and xanax. Pt unable to states how much of anything that she took, but does indorse that she done it on purpose.  Pt given narcan by EMS for decreased respirations.

## 2020-10-22 NOTE — ED Notes (Signed)
Pt placed on EtCO2.

## 2020-10-22 NOTE — ED Provider Notes (Signed)
Care assumed from Dr. Kathrynn Humble, patient presented with an overdose which is apparently both opiate and benzodiazepine plus ethanol.  Had brief period of hypotension and is getting IV fluids.  Blood pressure has stabilized.  Labs are unremarkable.  She is felt to be medically cleared for psychiatric evaluation and treatment.  TTS consultation has been requested.   Delora Fuel, MD 42/39/53 [phone]

## 2020-10-23 ENCOUNTER — Inpatient Hospital Stay (HOSPITAL_COMMUNITY): Admission: AD | Admit: 2020-10-23 | Payer: Medicare HMO | Source: Intra-hospital | Admitting: Psychiatry

## 2020-10-23 ENCOUNTER — Other Ambulatory Visit: Payer: Self-pay | Admitting: Psychiatric/Mental Health

## 2020-10-23 LAB — RAPID URINE DRUG SCREEN, HOSP PERFORMED
Amphetamines: NOT DETECTED
Barbiturates: NOT DETECTED
Benzodiazepines: POSITIVE — AB
Cocaine: NOT DETECTED
Opiates: POSITIVE — AB
Tetrahydrocannabinol: NOT DETECTED

## 2020-10-23 LAB — POC URINE PREG, ED: Preg Test, Ur: NEGATIVE

## 2020-10-23 LAB — SARS CORONAVIRUS 2 (TAT 6-24 HRS): SARS Coronavirus 2: NEGATIVE

## 2020-10-23 LAB — ACETAMINOPHEN LEVEL: Acetaminophen (Tylenol), Serum: <10 ug/mL — ABNORMAL LOW (ref 10–30)

## 2020-10-23 LAB — SALICYLATE LEVEL: Salicylate Lvl: <7 mg/dL — ABNORMAL LOW (ref 7.0–30.0)

## 2020-10-23 MED ORDER — LORAZEPAM 1 MG PO TABS
1.0000 mg | ORAL_TABLET | ORAL | Status: AC | PRN
  Administered 2020-10-23: 1 mg via ORAL
  Filled 2020-10-23: qty 1

## 2020-10-23 MED ORDER — ALBUTEROL SULFATE HFA 108 (90 BASE) MCG/ACT IN AERS
2.0000 | INHALATION_SPRAY | Freq: Four times a day (QID) | RESPIRATORY_TRACT | Status: DC | PRN
  Administered 2020-10-23 – 2020-10-24 (×4): 2 via RESPIRATORY_TRACT
  Filled 2020-10-23: qty 6.7

## 2020-10-23 MED ORDER — UMECLIDINIUM BROMIDE 62.5 MCG/INH IN AEPB
1.0000 | INHALATION_SPRAY | Freq: Every day | RESPIRATORY_TRACT | Status: DC
  Administered 2020-10-23 – 2020-10-24 (×2): 1 via RESPIRATORY_TRACT
  Filled 2020-10-23: qty 7

## 2020-10-23 MED ORDER — BUPROPION HCL ER (SR) 150 MG PO TB12
150.0000 mg | ORAL_TABLET | Freq: Two times a day (BID) | ORAL | Status: DC
  Administered 2020-10-23: 150 mg via ORAL
  Filled 2020-10-23 (×2): qty 1

## 2020-10-23 MED ORDER — IPRATROPIUM-ALBUTEROL 0.5-2.5 (3) MG/3ML IN SOLN: 3.0000 mL | RESPIRATORY_TRACT | Status: DC | PRN

## 2020-10-23 MED ORDER — ONDANSETRON HCL 4 MG PO TABS
4.0000 mg | ORAL_TABLET | Freq: Three times a day (TID) | ORAL | Status: DC | PRN
  Administered 2020-10-24: 4 mg via ORAL

## 2020-10-23 MED ORDER — NICOTINE 21 MG/24HR TD PT24
21.0000 mg | MEDICATED_PATCH | Freq: Every day | TRANSDERMAL | Status: DC
  Administered 2020-10-23 – 2020-10-24 (×2): 21 mg via TRANSDERMAL
  Filled 2020-10-23 (×2): qty 1

## 2020-10-23 MED ORDER — MOMETASONE FURO-FORMOTEROL FUM 100-5 MCG/ACT IN AERO
2.0000 | INHALATION_SPRAY | Freq: Two times a day (BID) | RESPIRATORY_TRACT | Status: DC
  Administered 2020-10-23 – 2020-10-24 (×3): 2 via RESPIRATORY_TRACT
  Filled 2020-10-23: qty 8.8

## 2020-10-23 MED ORDER — ALUM & MAG HYDROXIDE-SIMETH 200-200-20 MG/5ML PO SUSP: 30.0000 mL | Freq: Four times a day (QID) | ORAL | Status: DC | PRN

## 2020-10-23 MED ORDER — ZIPRASIDONE MESYLATE 20 MG IM SOLR: 20.0000 mg | INTRAMUSCULAR | Status: DC | PRN

## 2020-10-23 MED ORDER — ACETAMINOPHEN 325 MG PO TABS
650.0000 mg | ORAL_TABLET | ORAL | Status: DC | PRN
  Administered 2020-10-23 – 2020-10-24 (×5): 650 mg via ORAL
  Filled 2020-10-23 (×5): qty 2

## 2020-10-23 MED ORDER — BUDESON-GLYCOPYRROL-FORMOTEROL 160-9-4.8 MCG/ACT IN AERO: 2.0000 | INHALATION_SPRAY | Freq: Two times a day (BID) | RESPIRATORY_TRACT | Status: DC

## 2020-10-23 MED ORDER — PREGABALIN 75 MG PO CAPS
300.0000 mg | ORAL_CAPSULE | Freq: Every day | ORAL | Status: DC
  Administered 2020-10-23: 300 mg via ORAL
  Filled 2020-10-23: qty 4

## 2020-10-23 MED ORDER — RISPERIDONE 1 MG PO TBDP: 2.0000 mg | ORAL_TABLET | Freq: Three times a day (TID) | ORAL | Status: DC | PRN

## 2020-10-23 MED ORDER — FLUTICASONE PROPIONATE 50 MCG/ACT NA SUSP
2.0000 | Freq: Every day | NASAL | Status: DC
  Administered 2020-10-23 – 2020-10-24 (×2): 2 via NASAL
  Filled 2020-10-23: qty 16

## 2020-10-23 MED ORDER — ONDANSETRON HCL 4 MG PO TABS
4.0000 mg | ORAL_TABLET | Freq: Three times a day (TID) | ORAL | Status: DC | PRN
  Filled 2020-10-23: qty 1

## 2020-10-23 MED ORDER — PANTOPRAZOLE SODIUM 40 MG PO TBEC
40.0000 mg | DELAYED_RELEASE_TABLET | Freq: Every day | ORAL | Status: DC
  Administered 2020-10-23 – 2020-10-24 (×2): 40 mg via ORAL
  Filled 2020-10-23 (×2): qty 1

## 2020-10-23 MED ORDER — LEVOTHYROXINE SODIUM 50 MCG PO TABS
25.0000 ug | ORAL_TABLET | Freq: Every day | ORAL | Status: DC
  Administered 2020-10-23 – 2020-10-24 (×2): 25 ug via ORAL
  Filled 2020-10-23 (×2): qty 1

## 2020-10-23 MED ORDER — ACETAMINOPHEN 325 MG PO TABS: 650.0000 mg | ORAL_TABLET | ORAL | Status: DC | PRN

## 2020-10-23 NOTE — ED Notes (Signed)
Spoke to TTS regarding re-eval, per their policy pts only receive 1 assessment per day. Will re-eval in the morning. Pt updated regarding plan of care.

## 2020-10-23 NOTE — BH Assessment (Signed)
Clinician called the Tele-Assessment machine in an attempt to complete pt's CCA. Pt was unable to answer questions, keep her eyes open, or sit up due to the side effects of the o/d she took. TTS will attempt to complete CCA at a later time.

## 2020-10-23 NOTE — Progress Notes (Signed)
Pt accepted to [facility], bed 305-2  Lindon Romp, FNP is the accepting provider  Dr. Mallie Darting is the attending provider.    Call report to [phone]    Daphyne @ [facility] notified.     Pt is voluntary and will be transported by [place].  Pt is scheduled to arrive at [place] at [place], MSW, LCSW, [place] Worker II Disposition CSW [phone]

## 2020-10-23 NOTE — ED Notes (Signed)
Pt spoke to her mother by phone and told her mother to come pick her up.  I called pts mother and informed her that pt is not discharged and gave her the visiting hours.

## 2020-10-23 NOTE — ED Notes (Addendum)
Pt unable to be placed at [facility] as planned due to continuous use of home oxygen at 2.5-3L Gloria Calderon. TTS reordered for re-evaluation

## 2020-10-23 NOTE — ED Notes (Signed)
Pt cleared from [place]

## 2020-10-23 NOTE — ED Provider Notes (Signed)
[facility] EMERGENCY DEPARTMENT Provider Note   CSN: [id number] Arrival date & time: 10/22/20  2150     History Chief Complaint  Patient presents with  . Drug Overdose  . Suicidal    Gloria Calderon is a 55 y.o. female.  HPI   55 year old female comes in a chief complaint of overdose.  Patient has history of COPD, depression. Level 5 caveat for altered mental status.  Patient is unsure of who called EMS.  She reports that over the last couple of days she has been drinking because she has been sad.  Her ex husband had passed away recently.  I was unable to get further history from EMS.  It is unclear who called 911.  Patient is not giving me clear history what the intent was and if she did actually overdose.  Past Medical History:  Diagnosis Date  . Anxiety   . Arthritis   . Asthma   . Chronic back pain   . Constipation 04/02/2017  . COPD (chronic obstructive pulmonary disease) ([place])   . DDD (degenerative disc disease), lumbar   . Depression   . Dyspnea   . Fibromyalgia   . GERD (gastroesophageal reflux disease)   . H/O seasonal allergies   . Headache   . Hypothyroidism   . Osteoporosis   . Pneumonia 2008  . Ringing in ear, right   . Sciatica   . Spinal stenosis     Patient Active Problem List   Diagnosis Date Noted  . Positive colorectal cancer screening using Cologuard test 11/17/2018  . Right lower lobe pneumonia 11/03/2018  . Pre-operative cardiovascular examination 06/15/2018  . Angina, class III ([place]) 06/15/2018  . DDD (degenerative disc disease), lumbar 06/14/2018  . Vitamin D deficiency 06/14/2018  . Hypothyroidism 06/14/2018  . GERD (gastroesophageal reflux disease) 06/14/2018  . Back pain 06/14/2018  . Chronic respiratory failure with hypoxia ([place]) 01/12/2018  . Anxiety disorder 01/12/2018  . Polycythemia 07/20/2017  . Constipation 04/02/2017  . Tobacco abuse 07/29/2015  . Influenza A 10/11/2013  . COPD (chronic obstructive pulmonary  disease) ([place]) 10/11/2013  . Thrush 10/11/2013    Past Surgical History:  Procedure Laterality Date  . APPENDECTOMY    . CHOLECYSTECTOMY    . COLONOSCOPY WITH PROPOFOL N/A 03/25/2019   Procedure: COLONOSCOPY WITH PROPOFOL;  Surgeon: Rogene Houston, MD;  Location[facility];  Service: Endoscopy;  Laterality: N/A;  11:05  . LEFT HEART CATH AND CORONARY ANGIOGRAPHY N/A 06/17/2018   Procedure: LEFT HEART CATH AND CORONARY ANGIOGRAPHY;  Surgeon: Leonie Man, MD;  Location: [place] [facility] LAB;  Service: Cardiovascular;  Laterality: N/A;  . POLYPECTOMY  03/25/2019   Procedure: POLYPECTOMY;  Surgeon: Rogene Houston, MD;  Location: [facility];  Service: Endoscopy;;  Hepatic flexure, sigmoid colon     OB History    Gravida  1   Para  1   Term  1   Preterm      AB      Living  1     SAB      IAB      Ectopic      Multiple      Live Births              Family History  Problem Relation Age of Onset  . COPD Father   . Diabetes Mellitus II Mother     Social History   Tobacco Use  . Smoking status: Current Every Day Smoker  Packs/day: 2.50    Years: 44.00    Pack years: 110.00    Types: Cigarettes, E-cigarettes    Start date: 78  . Smokeless tobacco: Never Used  . Tobacco comment: Half a pack every 2 days ARJ 04/23/20  Vaping Use  . Vaping Use: Former  . Substances: Nicotine, CBD  Substance Use Topics  . Alcohol use: Yes    Alcohol/week: 4.0 standard drinks    Types: 4 Cans of beer per week    Comment: occasionally  . Drug use: No    Home Medications Prior to Admission medications   Medication Sig Start Date End Date Taking? Authorizing Provider  acetaminophen (TYLENOL) 650 MG CR tablet Take 1,300 mg by mouth 2 (two) times daily.     [provider]  albuterol (VENTOLIN HFA) 108 (90 Base) MCG/ACT inhaler 2 PUFFS EVERY 6 HOURS AS NEEDED FOR WHEEZING OR SHORTNESS OF BREATH Patient taking differently: Inhale 2 puffs into the lungs  every 6 (six) hours as needed for wheezing or shortness of breath.  02/17/19   Collene Gobble, MD  ALPRAZolam Duanne Moron) 1 MG tablet Take 1 tablet (1 mg total) by mouth 4 (four) times daily as needed for anxiety. Patient taking differently: Take 1 mg by mouth 3 (three) times daily as needed for anxiety.  01/12/18   Collene Gobble, MD  Aspirin-Acetaminophen-Caffeine 520-260-32.5 MG PACK Take by mouth.    [provider]  bisacodyl (DULCOLAX) 5 MG EC tablet Take 5 mg by mouth daily as needed for moderate constipation.     [provider]  Budeson-Glycopyrrol-Formoterol (BREZTRI AEROSPHERE) 160-9-4.8 MCG/ACT AERO Inhale 2 puffs into the lungs in the morning and at bedtime. 04/23/20   Collene Gobble, MD  buPROPion (WELLBUTRIN SR) 150 MG 12 hr tablet Take 1 tablet (150 mg total) by mouth 2 (two) times daily. 09/22/19   Martyn Ehrich, NP  Carboxymethylcellulose Sod PF (REFRESH PLUS) 0.5 % SOLN Place 1 drop into both eyes 3 (three) times daily.     [provider]  Cholecalciferol (VITAMIN D) 125 MCG (5000 UT) CAPS Take 1,500 Units by mouth daily.    [provider]  cyclobenzaprine (FLEXERIL) 10 MG tablet Take 30 mg by mouth 3 (three) times daily.     [provider]  esomeprazole (NEXIUM) 40 MG capsule Take 40 mg by mouth daily at 12 noon.    [provider]  fluticasone (FLONASE) 50 MCG/ACT nasal spray Place 2 sprays into both nostrils daily.    [provider]  ipratropium-albuterol (DUONEB) 0.5-2.5 (3) MG/3ML SOLN NEBULIZE 1 VIAL EVERY 6 HOURS AS NEEDED FOR SHORTNESS OF BREATH 06/14/19   Collene Gobble, MD  levothyroxine (SYNTHROID, LEVOTHROID) 25 MCG tablet Take 25 mcg by mouth daily before breakfast.  03/07/17   [provider]  Multiple Vitamins-Minerals (HAIR SKIN NAILS PO) Take 1 tablet by mouth daily.    [provider]  oxyCODONE (ROXICODONE) 15 MG immediate release tablet Take 15 mg by mouth in the morning, at noon,  and at bedtime.     [provider]  OXYGEN Inhale 2.5-3 L into the lungs continuous.     [provider]  predniSONE (DELTASONE) 10 MG tablet 40mg X3 days, 30mg  X3 days, 20mg  X3 days, 10mg X3 days, then stop. 07/05/20   Collene Gobble, MD  pregabalin (LYRICA) 150 MG capsule Take 300 mg by mouth daily.     [provider]  psyllium (METAMUCIL) 58.6 % powder Take 1  packet by mouth daily.    [provider]  varenicline (CHANTIX CONTINUING MONTH PAK) 1 MG tablet Take 1 tablet (1 mg total) by mouth 2 (two) times daily. 10/31/19   Martyn Ehrich, NP  varenicline (CHANTIX STARTING MONTH PAK) 0.5 MG X 11 & 1 MG X 42 tablet Take one 0.5 mg tablet by mouth once daily for 3 days, then increase to one 0.5 mg tablet twice daily for 4 days, then increase to one 1 mg tablet twice daily. 10/31/19   Martyn Ehrich, NP    Allergies    Isosorbide, Levaquin [levofloxacin], and Morphine and related  Review of Systems   Review of Systems  Unable to perform ROS: Mental status change  Constitutional: Positive for activity change.  Respiratory: Negative for shortness of breath.   Cardiovascular: Negative for chest pain.  Gastrointestinal: Negative for nausea and vomiting.  Psychiatric/Behavioral: Positive for self-injury.    Physical Exam Updated Vital Signs BP 90/62   Pulse 87   Temp 98.9 F (37.2 C) (Oral)   Resp 13   Ht 5\' 4"  (1.626 m)   Wt 56.6 kg   SpO2 98%   BMI 21.42 kg/m   Physical Exam Vitals and nursing note reviewed.  Constitutional:      Appearance: She is well-developed.     Comments: Somnolent  HENT:     Head: Normocephalic and atraumatic.  Eyes:     Extraocular Movements: EOM normal.  Cardiovascular:     Rate and Rhythm: Normal rate.  Pulmonary:     Effort: Pulmonary effort is normal.  Abdominal:     General: Bowel sounds are normal.  Musculoskeletal:     Cervical back: Normal range of motion and neck supple.  Skin:    General:  Skin is warm and dry.  Neurological:     Mental Status: She is oriented to person, place, and time.     ED Results / Procedures / Treatments   Labs (all labs ordered are listed, but only abnormal results are displayed) Labs Reviewed  ETHANOL - Abnormal; Notable for the following components:      Result Value   Alcohol, Ethyl (B) 170 (*)    All other components within normal limits  CBC WITH DIFFERENTIAL/PLATELET - Abnormal; Notable for the following components:   Hemoglobin 16.8 (*)    HCT 51.1 (*)    MCV 100.8 (*)    All other components within normal limits  RESP PANEL BY RT-PCR (FLU A&B, COVID) ARPGX2  SARS CORONAVIRUS 2 (TAT 6-24 HRS)  COMPREHENSIVE METABOLIC PANEL  RAPID URINE DRUG SCREEN, HOSP PERFORMED  POC URINE PREG, ED    EKG EKG Interpretation  Date/Time:  Monday October 22 2020 22:15:19 EST Ventricular Rate:  112 PR Interval:    QRS Duration: 79 QT Interval:  326 QTC Calculation: 441 R Axis:   -165 Text Interpretation: Sinus tachycardia Paired ventricular premature complexes Aberrant conduction of SV complex(es) Anteroseptal infarct, age indeterminate No acute changes No significant change since last tracing Confirmed by Varney Biles [phone]) on 10/22/2020 11:29:30 PM   Radiology No results found.  Procedures .Critical Care Performed by: Varney Biles, MD Authorized by: Varney Biles, MD   Critical care provider statement:    Critical care time (minutes):  48   Critical care was necessary to treat or prevent imminent or life-threatening deterioration of the following conditions:  Circulatory failure and CNS failure or compromise   Critical care was time spent personally by me on the  following activities:  Discussions with consultants, evaluation of patient's response to treatment, examination of patient, ordering and performing treatments and interventions, ordering and review of laboratory studies, ordering and review of radiographic studies, pulse  oximetry, re-evaluation of patient's condition, obtaining history from patient or surrogate and review of old charts     Medications Ordered in ED Medications  lactated ringers bolus 2,000 mL (2,000 mLs Intravenous New Bag/Given 10/23/20 0005)    ED Course  I have reviewed the triage vital signs and the nursing notes.  Pertinent labs & imaging results that were available during my care of the patient were reviewed by me and considered in my medical decision making (see chart for details).    MDM Rules/Calculators/A&P                         55 year old female comes in a chief complaint of overdose.  Apparently patient had agonal respirations for EMS.  She is arousable, somnolent and protecting airway.  She had hypoxia, with 2 L of oxygen her O2 sats are fine.  It is unclear when patient overdosed and if she overdosed.  She does admit to alcohol ingestion and also taking some pain meds.  She is not able to clarify how much and what pain medication she took.  We do not have any clear collateral history either.  Patient's BP is also soft initially.  She has been given 2 L of IV fluid and she is on 2 L of oxygen at the moment.  Her BP did get better with the fluid.  End-tidal CO2 has been placed.  She has been reassessed on 2 separate occasions, last one being at 12:15 AM.  She is medically cleared for psych evaluation.  However I have informed Dr. Roxanne Mins about the soft blood pressure and possible need for further intervention.   Final Clinical Impression(s) / ED Diagnoses Final diagnoses:  Acute drug overdose, accidental or unintentional, initial encounter    Rx / DC Orders ED Discharge Orders    None       Varney Biles, MD 10/23/20 [phone]

## 2020-10-23 NOTE — Progress Notes (Signed)
Due to patients use of a portable oxygen tank, [facility], has rescinded their bed offer for placement.  CSW to follow up with referrals that were sent to other facilities this morning (10/23/2020).  [facility] Disposition [phone] (cell)

## 2020-10-23 NOTE — BH Assessment (Signed)
Comprehensive Clinical Assessment (CCA) Note  10/23/2020 Gloria EDMONSTON [id number]  Chief Complaint:  Chief Complaint  Patient presents with  . Drug Overdose  . Suicidal   Visit Diagnosis: F33.2, Major depressive disorder, Recurrent episode, Severe   CCA Screening, Triage and Referral (STR) Gloria Calderon is a 55 year old patient who was brought to [facility] via EMS due to pt intentionally o/d on EtOH, pain medication, and Xanax. Pt states, "I just wanted to be in a deep sleep for a new minutes. I didn't think they'd hurt me." Moments later she states, "A few years ago also [attempted to kill himself]; [at that time] I wanted help. This time I didn't care." Clinician inquired as to how she would have felt/if she would have been fine if she had died from this intentional o/d; pt replied, "Selfishly and honestly, yes. I just wanted some peace and some rest. Right now I'm just sorry that it didn't happen (I didn't die)."  Pt confirms she attempted to kill herself in 2004 by "taking lots of my sleeping medication." She denies HI, AVH, NSSIB, and access to guns/weapons. Pt states she's unsure as to whether she is engaged with the legal system. Pt states she engages in the use of EtOH 3 - 4x/month and that each time she drinks "more and more."  Pt's protective factors include no HI and no AVH.  Pt declined to provide verbal consent for clinician to make contact with friends/family for collateral information.  Pt is oriented x3; she is not aware of where she is or the date. Pt's memory is intact. Pt was cooperative, though tearful, throughout the assessment process. Pt's insight, judgement, and impulse control is poor at this time.    Recommendations for Services/Supports/Treatments: Lindon Romp, NP, reviewed pt's chart and information and determined pt meets inpatient criteria. Pt will be reviewed by [facility] to determine if there is currently an appropriate bed; if there is no appropriate bed,  pt's referral information will be faxed to multiple hospitals for potential placement. This information was provided to pt's nurse, Lanette Hampshire, at [phone].    Patient Reported Information How did you hear about [place]? Other (Comment) (EDP)  Referral name: EDP  Referral phone number: 0 (N/A)   Whom do you see for routine medical problems? Other (Comment) (Dr. Baltazar Apo - Pulmonology)  Practice/Facility Name: No data recorded Practice/Facility Phone Number: No data recorded Name of Contact: No data recorded Contact Number: No data recorded Contact Fax Number: No data recorded Prescriber Name: No data recorded Prescriber Address (if known): No data recorded  What Is the Reason for Your Visit/Call Today? Pt states she attempted to overdose on her medication for "some peace and some rest." Pt states, "Right now I'm just sorry that it didn't happen (I wasn't successfull in killing myself)."  How Long Has This Been Causing You Problems? 1-6 months  What Do You Feel Would Help You the Most Today? Other (Comment) (Pt does not know)   Have You Recently Been in Any Inpatient Treatment (Hospital/Detox/Crisis Center/28-Day Program)? No  Name/Location of Program/Hospital:No data recorded How Long Were You There? No data recorded When Were You Discharged? No data recorded  Have You Ever Received Services From [facility] Before? Yes  Who Do You See at [facility]? Dr. Baltazar Apo - Pulmonology   Have You Recently Had Any Thoughts About Hurting Yourself? Yes  Are You Planning to Commit Suicide/Harm Yourself At This time? Yes   Have you Recently Had Thoughts About  Hurting Someone Gloria Calderon? No  Explanation: No data recorded  Have You Used Any Alcohol or Drugs in the Past 24 Hours? Yes  How Long Ago Did You Use Drugs or Alcohol? No data recorded What Did You Use and How Much? EtOH, pain pills, Xanax   Do You Currently Have a Therapist/Psychiatrist? No  Name of Therapist/Psychiatrist:  No data recorded  Have You Been Recently Discharged From Any Office Practice or Programs? No  Explanation of Discharge From Practice/Program: No data recorded    CCA Screening Triage Referral Assessment Type of Contact: Tele-Assessment  Is this Initial or Reassessment? Initial Assessment  Date Telepsych consult ordered in [facility]:  10/23/2020  Time Telepsych consult ordered in [facility]:  0028   Patient Reported Information Reviewed? Yes  Patient Left Without Being Seen? No data recorded Reason for Not Completing Assessment: No data recorded  Collateral Involvement: Pt declined to provide verbal consent for clinician to make contact with friends/family members to obtain collateral information.   Does Patient Have a Stage manager Guardian? No data recorded Name and Contact of Legal Guardian: No data recorded If Minor and Not Living with Parent(s), Who has Custody? N/A  Is CPS involved or ever been involved? Never  Is APS involved or ever been involved? Never   Patient Determined To Be At Risk for Harm To Self or Others Based on Review of Patient Reported Information or Presenting Complaint? Yes, for Self-Harm  Method: No data recorded Availability of Means: No data recorded Intent: No data recorded Notification Required: No data recorded Additional Information for Danger to Others Potential: No data recorded Additional Comments for Danger to Others Potential: No data recorded Are There Guns or Other Weapons in Your Home? No data recorded Types of Guns/Weapons: No data recorded Are These Weapons Safely Secured?                            No data recorded Who Could Verify You Are Able To Have These Secured: No data recorded Do You Have any Outstanding Charges, Pending Court Dates, Parole/Probation? No data recorded Contacted To Inform of Risk of Harm To Self or Others: Family/Significant Other: (Pt's family is aware)   Location of Assessment: [facility]   Does Patient Present  under Involuntary Commitment? No  IVC Papers Initial File Date: No data recorded  [place] of Residence: [place]   Patient Currently Receiving the Following Services: Not Receiving Services   Determination of Need: Emergent (2 hours)   Options For Referral: Inpatient Hospitalization     CCA Biopsychosocial Intake/Chief Complaint:  Pt states she attempted to overdose on her medication for "some peace and some rest." Pt states, "Right now I'm just sorry that it didn't happen (I wasn't successfull in killing myself)."  Current Symptoms/Problems: Pt is tearful and declines to privde specific information re: her stressors   Patient Reported Schizophrenia/Schizoaffective Diagnosis in Past: No data recorded  Strengths: Pt has good communication skills and is able to share her wants/needs/  Preferences: N/A  Abilities: N/A   Type of Services Patient Feels are Needed: Pt is unsure at this time.   Initial Clinical Notes/Concerns: N/A   Mental Health Symptoms Depression:  Hopelessness; Tearfulness; Worthlessness   Duration of Depressive symptoms: Greater than two weeks   Mania:  None   Anxiety:   Tension   Psychosis:  None   Duration of Psychotic symptoms: No data recorded  Trauma:  Guilt/shame   Obsessions:  None   Compulsions:  None   Inattention:  None   Hyperactivity/Impulsivity:  N/A   Oppositional/Defiant Behaviors:  None   Emotional Irregularity:  Chronic feelings of emptiness; Mood lability; Potentially harmful impulsivity; Recurrent suicidal behaviors/gestures/threats   Other Mood/Personality Symptoms:  None noted    Mental Status Exam Appearance and self-care  Stature:  Average   Weight:  Average weight   Clothing:  -- (Pt is dressed in scrubs)   Grooming:  Normal   Cosmetic use:  None   Posture/gait:  -- (Pt is sitting propped up in her hospital bed)   Motor activity:  Not Remarkable   Sensorium  Attention:  Normal   Concentration:   Normal   Orientation:  Situation; Person; Object   Recall/memory:  Normal   Affect and Mood  Affect:  Anxious; Depressed   Mood:  Depressed   Relating  Eye contact:  Avoided   Facial expression:  Depressed; Sad   Attitude toward examiner:  Cooperative   Thought and Language  Speech flow: Slurred; Soft   Thought content:  Appropriate to Mood and Circumstances   Preoccupation:  Suicide   Hallucinations:  None   Organization:  No data recorded  [employer] of Knowledge:  Average   Intelligence:  Average   Abstraction:  Normal   Judgement:  Poor   Reality Testing:  Realistic   Insight:  Poor   Decision Making:  Impulsive   Social Functioning  Social Maturity:  Impulsive   Social Judgement:  Heedless   Stress  Stressors:  Family conflict; Grief/losses   Coping Ability:  Deficient supports   Skill Deficits:  Environmental health practitioner; Self-control   Supports:  Support needed     Religion: Religion/Spirituality Are You A Religious Person?:  (N/A) How Might This Affect Treatment?: N/A  Leisure/Recreation: Leisure / Recreation Do You Have Hobbies?:  (N/A)  Exercise/Diet: Exercise/Diet Do You Exercise?:  (N/A) Have You Gained or Lost A Significant Amount of Weight in the Past Six Months?:  (N/A) Do You Follow a Special Diet?:  (N/A) Do You Have Any Trouble Sleeping?:  (N/A)   CCA Employment/Education Employment/Work Situation: Employment / Work Copywriter, 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 by current illness:  (N/A) What is the longest time patient has a held a job?: N/A Where was the patient employed at that time?: N/A Has patient ever been in the [employer]?:  (N/A)  Education: Education Is Patient Currently Attending School?: No Last Grade Completed:  (N/A) Name of High School: N/A Did Teacher, adult education From [employer]?:  (N/A) Did You Attend College?:  (N/A) Did You Attend Graduate School?:  (N/A) Did You  Have Any Special Interests In School?: N/A Did You Have An Individualized Education Program (IIEP):  (N/A) Did You Have Any Difficulty At School?:  (N/A) Patient's Education Has Been Impacted by Current Illness:  (N/A)   CCA Family/Childhood History Family and Relationship History: Family history Marital status: Widowed Widowed, when?: Several years ago Additional relationship information: Pt's ex-husband and spouse are both deceased. Are you sexually active?:  (N/A) What is your sexual orientation?: N/A Has your sexual activity been affected by drugs, alcohol, medication, or emotional stress?: N/A Does patient have children?: Yes How many children?:  (Unknown) How is patient's relationship with their children?: Pt shares her son will not talk to her due to an incident 30 years ago.  Childhood History:  Childhood History By whom was/is the patient raised?:  (N/A) Additional childhood  history information: N/A Description of patient's relationship with caregiver when they were a child: N/A Patient's description of current relationship with people who raised him/her: Pt shares she and her mother have a difficult relationship but, at this time, she would like to see her mother. How were you disciplined when you got in trouble as a child/adolescent?: N/A Does patient have siblings?:  (N/A) Did patient suffer any verbal/emotional/physical/sexual abuse as a child?:  (N/A) Did patient suffer from severe childhood neglect?:  (N/A) Has patient ever been sexually abused/assaulted/raped as an adolescent or adult?:  (N/A) Was the patient ever a victim of a crime or a disaster?:  (N/A) Witnessed domestic violence?:  (N/A) Has patient been affected by domestic violence as an adult?:  (N/A)  Child/Adolescent Assessment:     CCA Substance Use Alcohol/Drug Use: Alcohol / Drug Use Pain Medications: Please see MAR Prescriptions: Please see MAR Over the Counter: Please see MAR History of  alcohol / drug use?: Yes Longest period of sobriety (when/how long): Unknown Negative Consequences of Use: Personal relationships Substance #1 Name of Substance 1: EtOH 1 - Age of First Use: Unknown 1 - Amount (size/oz): "More and more." 1 - Frequency: 3 - 4x/month 1 - Duration: Unknown 1 - Last Use / Amount: 10/22/2020 1 - Method of Aquiring: Purchase 1- Route of Use: Oral                       ASAM's:  Six Dimensions of Multidimensional Assessment  Dimension 1:  Acute Intoxication and/or Withdrawal Potential:      Dimension 2:  Biomedical Conditions and Complications:      Dimension 3:  Emotional, Behavioral, or Cognitive Conditions and Complications:     Dimension 4:  Readiness to Change:     Dimension 5:  Relapse, Continued use, or Continued Problem Potential:     Dimension 6:  Recovery/Living Environment:     ASAM Severity Score:    ASAM Recommended Level of Treatment:     Substance use Disorder (SUD)    Recommendations for Services/Supports/Treatments: Lindon Romp, NP, reviewed pt's chart and information and determined pt meets inpatient criteria. Pt will be reviewed by [facility] to determine if there is currently an appropriate bed; if there is no appropriate bed, pt's referral information will be faxed to multiple hospitals for potential placement. This information was provided to pt's nurse, Lanette Hampshire, at [phone].    DSM5 Diagnoses: Patient Active Problem List   Diagnosis Date Noted  . Positive colorectal cancer screening using Cologuard test 11/17/2018  . Right lower lobe pneumonia 11/03/2018  . Pre-operative cardiovascular examination 06/15/2018  . Angina, class III ([place]) 06/15/2018  . DDD (degenerative disc disease), lumbar 06/14/2018  . Vitamin D deficiency 06/14/2018  . Hypothyroidism 06/14/2018  . GERD (gastroesophageal reflux disease) 06/14/2018  . Back pain 06/14/2018  . Chronic respiratory failure with hypoxia ([place]) 01/12/2018  . Anxiety disorder  01/12/2018  . Polycythemia 07/20/2017  . Constipation 04/02/2017  . Tobacco abuse 07/29/2015  . Influenza A 10/11/2013  . COPD (chronic obstructive pulmonary disease) ([place]) 10/11/2013  . Thrush 10/11/2013    Patient Centered Plan: Patient is on the following Treatment Plan(s):  Depression and Impulse Control   Referrals to Alternative Service(s): Referred to Alternative Service(s):   Place:   Date:   Time:    Referred to Alternative Service(s):   Place:   Date:   Time:    Referred to Alternative Service(s):   Place:  Date:   Time:    Referred to Alternative Service(s):   Place:   Date:   Time:     Dannielle Burn, LMFT

## 2020-10-23 NOTE — Progress Notes (Signed)
Pt meets inpatient criteria. Referral information has been sent to the following hospitals for review:  [facility]  [facility]      Disposition will continue to follow for inpatient placement needs,.   Audree Camel, MSW, LCSW, [place] Clinical Social Worker II Disposition CSW [phone]

## 2020-10-24 LAB — URINALYSIS, ROUTINE W REFLEX MICROSCOPIC
Bilirubin Urine: NEGATIVE
Glucose, UA: NEGATIVE mg/dL
Hgb urine dipstick: NEGATIVE
Ketones, ur: NEGATIVE mg/dL
Leukocytes,Ua: NEGATIVE
Nitrite: NEGATIVE
Protein, ur: NEGATIVE mg/dL
Specific Gravity, Urine: 1.001 — ABNORMAL LOW (ref 1.005–1.030)
pH: 8 (ref 5.0–8.0)

## 2020-10-24 LAB — RESP PANEL BY RT-PCR (FLU A&B, COVID) ARPGX2
Influenza A by PCR: NEGATIVE
Influenza B by PCR: NEGATIVE
SARS Coronavirus 2 by RT PCR: NEGATIVE

## 2020-10-24 LAB — ETHANOL: Alcohol, Ethyl (B): <10 mg/dL (ref <10)

## 2020-10-24 MED ORDER — LORAZEPAM 1 MG PO TABS
1.0000 mg | ORAL_TABLET | Freq: Once | ORAL | Status: AC
  Administered 2020-10-24: 1 mg via ORAL
  Filled 2020-10-24: qty 1

## 2020-10-24 NOTE — Progress Notes (Signed)
Pt accepted to [facility] @ [facility], Bed 152-1.   Dr. Edrick Oh is the attending provider.    Call report to [phone]    Norm Salt, RN @ [facility] notified via secure chate.     Pt is IVC .    Pt may be transported by [employer]   Pt scheduled  to arrive at [facility] @ [facility], time to be discussed during report.   Signed:  Durenda Hurt, MSW, [place], LCASA 10/24/2020 2:16 PM

## 2020-10-24 NOTE — ED Provider Notes (Signed)
Patient accepted by [facility].  EMTALA completed.   Varney Biles, MD 10/24/20 [phone]

## 2020-10-24 NOTE — Progress Notes (Signed)
[place] admissions staff is requesting a repeat blood alcohol test and a urine culture. [facility] RN notified.   IVC paperwork should be faxed to [place] [facility] at [place], MSW, [place], [place] Worker II Disposition CSW [phone]

## 2020-10-24 NOTE — ED Provider Notes (Signed)
Emergency Medicine Observation Re-evaluation Note  Gloria Calderon is a 55 y.o. female, seen on rounds today.  Pt initially presented to the ED for complaints of Drug Overdose and Suicidal Currently, the patient is resting comfortably.  Physical Exam  BP (!) 142/76 (BP Location: Right Arm)   Pulse 98   Temp 99 F (37.2 C) (Oral)   Resp 18   Ht 5\' 4"  (1.626 m)   Wt 56.6 kg   SpO2 100%   BMI 21.42 kg/m  Physical Exam General: NAD Cardiac: Regular rate Lungs: On home O2 requirement, no respiratory distress Psych: No significant changes  ED Course / MDM  EKG:EKG Interpretation  Date/Time:  Monday October 22 2020 22:15:19 EST Ventricular Rate:  112 PR Interval:    QRS Duration: 79 QT Interval:  326 QTC Calculation: 441 R Axis:   -165 Text Interpretation: Sinus tachycardia Paired ventricular premature complexes Aberrant conduction of SV complex(es) Anteroseptal infarct, age indeterminate No acute changes No significant change since last tracing Confirmed by Varney Biles [phone]) on 10/22/2020 11:29:30 PM    I have reviewed the labs performed to date as well as medications administered while in observation.    Plan  Current plan is for TTS/ Psych re-evaluation.  Patient reportedly not a candidate for [facility] transfer due to need for continuous O2. Patient is not under full IVC at this time.   Wyvonnia Dusky, MD 10/24/20 [phone]

## 2020-10-24 NOTE — BH Assessment (Signed)
Client remains in the ED following an overdose on alcohol, xanax, and other medication.  Pt was reassessed this AM. She reported that she ''feels ok, just want to go home.''  Pt stated that she has been under significant pressure lately -- her brother (with whom she lives) uses crack, she is on disability, and she cares for her mother.  Pt admitted to overdosing on substances, but denied that the overdose was a suicide attempt.  Pt stated that she wants to go home so she can take care of her mother.  ''I have a lot to live for.''  Pt does not receive any outpatient care.  Recommend continued inpatient.

## 2020-12-14 ENCOUNTER — Encounter: Payer: Self-pay | Admitting: *Deleted

## 2020-12-31 ENCOUNTER — Telehealth: Payer: Self-pay | Admitting: Emergency Medicine

## 2020-12-31 MED ORDER — SPACER/AERO-HOLDING CHAMBERS DEVI: 0 refills | Status: DC

## 2020-12-31 MED ORDER — BREZTRI AEROSPHERE 160-9-4.8 MCG/ACT IN AERO: 2.0000 | INHALATION_SPRAY | Freq: Two times a day (BID) | RESPIRATORY_TRACT | 0 refills | Status: DC

## 2020-12-31 NOTE — Telephone Encounter (Signed)
rx have been sent to [facility] for refills.

## 2021-01-09 ENCOUNTER — Other Ambulatory Visit: Payer: Self-pay

## 2021-01-09 ENCOUNTER — Encounter: Payer: Self-pay | Admitting: Emergency Medicine

## 2021-01-09 ENCOUNTER — Ambulatory Visit: Payer: Medicare HMO | Admitting: Emergency Medicine

## 2021-01-09 DIAGNOSIS — J431 Panlobular emphysema: Secondary | ICD-10-CM

## 2021-01-09 DIAGNOSIS — Z720 Tobacco use: Secondary | ICD-10-CM

## 2021-01-09 DIAGNOSIS — R918 Other nonspecific abnormal finding of lung field: Secondary | ICD-10-CM | POA: Insufficient documentation

## 2021-01-09 DIAGNOSIS — J9611 Chronic respiratory failure with hypoxia: Secondary | ICD-10-CM

## 2021-01-09 DIAGNOSIS — R911 Solitary pulmonary nodule: Secondary | ICD-10-CM

## 2021-01-09 NOTE — Assessment & Plan Note (Signed)
Her POC battery has been changed 3 times but the device still will not hold charge for an hour.  I think she needs a new POC and we will send an order to her DME for this.

## 2021-01-09 NOTE — Patient Instructions (Addendum)
Please continue Breztri as you have been taking it twice a day.  Rinse and gargle after using Keep albuterol available use 2 puffs if needed for shortness of breath, chest tightness, wheezing. Work hard to decrease your smoking. We need to get a copy of the CT scan of the chest that you had at [facility] so that we can review.  Depending on this scan we will determine the timing of repeat CT or any other testing that may be necessary to evaluate a pulmonary nodule. We will send an order to [employer] to get you a new portable oxygen concentrator since your current device is not functioning well. Follow with Dr Lamonte Sakai in 1 month

## 2021-01-09 NOTE — Addendum Note (Signed)
Addended by: Valerie Salts on: 01/09/2021 04:15 PM   Modules accepted: Orders

## 2021-01-09 NOTE — Progress Notes (Signed)
Subjective:    Patient ID: Gloria Calderon, female    DOB: 1966/08/26, 56 y.o.   MRN: [id number]  COPD She complains of cough, shortness of breath and wheezing. Associated symptoms include postnasal drip. Pertinent negatives include no appetite change, ear pain, fever, headaches, rhinorrhea, sneezing, sore throat or trouble swallowing. Her past medical history is significant for COPD.   ROV 07/04/2019 --this is a follow-up visit for 55 year old active smoker with severe COPD and associated hypoxemic respiratory failure.  She has frequent exacerbations, last was February 2020. She is currently managed on Trelegy.  Uses DuoNeb about 1-2x a day, or albuterol approximately 3-5 x a day. She is currently smoking approximately about 5-10 cig a day. Still wants to stop - not really ready to set a quit date. She has been on wellbutrin.  Flu shot today.   ROV 04/23/20 --55 year old active smoker with severe COPD and hypoxemic respiratory failure.  I last spoke with her 12/23/2019 by phone visit.  She is on Breztri and DuoNeb as needed, using approximately once a week. She is working on Dietitian assistance from the company for the [employer]. If unable she would be willing to go back to Trelegy She has been on Chantix but there is a shortage right now so she has not taken in 2 months, currently smoking approximately 5 cig a day. She was treated with pred taper in June and benefited, back to baseline.  She has had the COVID vaccine.   ROV 01/09/21 --follow-up visit 55 year old active smoker with severe COPD, associated hypoxemic respiratory failure.  She had intermittent flaring symptoms, was treated for an acute exacerbation most recently in October 2021.  Currently managed on Breztri via a spacer, has albuterol which she uses approximately She tells me that she had a CT chest at [facility] 12/07/20 that showed right sided pulmonary nodule, ? Size.   She is smoking about 0.5 - 1 pk/day. Off Chantix since it  was recalled. Has been too expensive to restart.     Review of Systems  Constitutional: Negative for appetite change, fever and unexpected weight change.  HENT: Positive for congestion and postnasal drip. Negative for dental problem, ear pain, nosebleeds, rhinorrhea, sinus pressure, sneezing, sore throat and trouble swallowing.   Eyes: Negative for redness and itching.  Respiratory: Positive for cough, chest tightness, shortness of breath and wheezing.   Cardiovascular: Negative for palpitations and leg swelling.  Gastrointestinal: Negative for abdominal pain, nausea and vomiting.  Genitourinary: Negative for dysuria.  Musculoskeletal: Negative for joint swelling.  Skin: Negative for rash.  Neurological: Negative for headaches.  Hematological: Does not bruise/bleed easily.  Psychiatric/Behavioral: Negative for dysphoric mood. The patient is not nervous/anxious.        Objective:   Physical Exam Vitals:   01/09/21 1510  BP: 126/82  Pulse: 92  Temp: 98.9 F (37.2 C)  TempSrc: Temporal  SpO2: 95%  Weight: 114 lb (51.7 kg)  Height: 5\' 4"  (1.626 m)   Gen: Pleasant, well-nourished, in no distress, ill appearing  ENT: No lesions,  mouth clear,  oropharynx clear, nasal congestion, strong voice  Neck: No JVD, no stridor  Lungs: No use of accessory muscles, B exp wheezes  Cardiovascular: RRR, heart sounds normal, no murmur or gallops, no peripheral edema  Musculoskeletal: No deformities, no cyanosis or clubbing  Neuro: alert, non focal  Skin: Warm, no lesions or rashes      Assessment & Plan:  Pulmonary nodule She reports that she had a right-sided pulmonary  nodule on outside CT scan, size unknown.  I need to get copies of the scan so that we can plan any repeat scanning, procedures that may be necessary to fully evaluate.  We will have her fill out a release form today.  Tobacco abuse Discussed cutting down and cessation with her today.  She cannot afford to restart  Chantix.  She is on Cymbalta and unclear that adding Wellbutrin will be beneficial  COPD (chronic obstructive pulmonary disease) (HCC) Continue Breztri, albuterol as needed.  Needs smoking cessation  Chronic respiratory failure with hypoxia (HCC) Her POC battery has been changed 3 times but the device still will not hold charge for an hour.  I think she needs a new POC and we will send an order to her DME for this.  Baltazar Apo, MD, PhD 01/09/2021, 3:33 PM  Pulmonary and Critical Care [phone] or if no answer [phone]

## 2021-01-09 NOTE — Assessment & Plan Note (Signed)
Continue Breztri, albuterol as needed.  Needs smoking cessation

## 2021-01-09 NOTE — Assessment & Plan Note (Signed)
She reports that she had a right-sided pulmonary nodule on outside CT scan, size unknown.  I need to get copies of the scan so that we can plan any repeat scanning, procedures that may be necessary to fully evaluate.  We will have her fill out a release form today.

## 2021-01-09 NOTE — Assessment & Plan Note (Signed)
Discussed cutting down and cessation with her today.  She cannot afford to restart Chantix.  She is on Cymbalta and unclear that adding Wellbutrin will be beneficial

## 2021-01-16 ENCOUNTER — Telehealth: Payer: Self-pay | Admitting: Emergency Medicine

## 2021-01-16 MED ORDER — BREZTRI AEROSPHERE 160-9-4.8 MCG/ACT IN AERO: 2.0000 | INHALATION_SPRAY | Freq: Two times a day (BID) | RESPIRATORY_TRACT | 3 refills | Status: DC

## 2021-01-16 NOTE — Telephone Encounter (Signed)
Refill has been sent in for the breztri to her mail order pharmacy.  Pt is aware and nothing further is needed.

## 2021-02-13 ENCOUNTER — Encounter: Payer: Self-pay | Admitting: Emergency Medicine

## 2021-02-13 ENCOUNTER — Ambulatory Visit (INDEPENDENT_AMBULATORY_CARE_PROVIDER_SITE_OTHER): Payer: Medicare HMO | Admitting: Emergency Medicine

## 2021-02-13 ENCOUNTER — Other Ambulatory Visit: Payer: Self-pay

## 2021-02-13 DIAGNOSIS — R911 Solitary pulmonary nodule: Secondary | ICD-10-CM

## 2021-02-13 DIAGNOSIS — Z720 Tobacco use: Secondary | ICD-10-CM

## 2021-02-13 DIAGNOSIS — J431 Panlobular emphysema: Secondary | ICD-10-CM

## 2021-02-13 DIAGNOSIS — J9611 Chronic respiratory failure with hypoxia: Secondary | ICD-10-CM

## 2021-02-13 MED ORDER — PREDNISONE 10 MG PO TABS: 30.0000 mg | ORAL_TABLET | Freq: Every day | ORAL | 0 refills | Status: AC

## 2021-02-13 NOTE — Assessment & Plan Note (Signed)
Smoking cessation discussed 

## 2021-02-13 NOTE — Progress Notes (Signed)
Subjective:    Patient ID: Gloria Calderon, female    DOB: 01-10-1966, 55 y.o.   MRN: [id number]  COPD She complains of cough, shortness of breath and wheezing. Associated symptoms include postnasal drip. Pertinent negatives include no appetite change, ear pain, fever, headaches, rhinorrhea, sneezing, sore throat or trouble swallowing. Her past medical history is significant for COPD.    ROV 04/23/20 --55 year old active smoker with severe COPD and hypoxemic respiratory failure.  I last spoke with her 12/23/2019 by phone visit.  She is on Breztri and DuoNeb as needed, using approximately once a week. She is working on Dietitian assistance from the company for the [employer]. If unable she would be willing to go back to Treleg[place] She has been on Chantix but there is a shortage right now so she has not taken in 2 months, currently smoking approximately 5 cig a day. She was treated with pred taper in June and benefited, back to baseline.  She has had the COVID vaccine.   ROV 01/09/21 --follow-up visit 55 year old active smoker with severe COPD, associated hypoxemic respiratory failure.  She had intermittent flaring symptoms, was treated for an acute exacerbation most recently in October 2021.  Currently managed on Breztri via a spacer, has albuterol which she uses approximately She tells me that she had a CT chest at [facility] 12/07/20 that showed right sided pulmonary nodule, ? Size.   She is smoking about 0.5 - 1 pk/day. Off Chantix since it was recalled. Has been too expensive to restart.   ROV 02/13/21 --55 year old woman with history of tobacco use and associated severe COPD, hypoxemic respiratory failure, frequent flaring symptoms consistent with chronic bronchitis. Currently managed on Breztri with a spacer. She was noted to have pulmonary nodules on a CT chest done in [facility] 12/13/20.  The report is available.  Note was made of moderately severe centrilobular emphysema, 3.5 mm anterior right lower  lobe pulmonary nodule. She has cut her cigarettes down to less than 5 a day.  She is on breztri, seems to be benefiting. She has been more SOB lately due to the severe heat. She uses albuterol nebs as needed. Having a lot of cough with clear  / yellow mucous.      Review of Systems  Constitutional: Negative for appetite change, fever and unexpected weight change.  HENT: Positive for congestion and postnasal drip. Negative for dental problem, ear pain, nosebleeds, rhinorrhea, sinus pressure, sneezing, sore throat and trouble swallowing.   Eyes: Negative for redness and itching.  Respiratory: Positive for cough, chest tightness, shortness of breath and wheezing.   Cardiovascular: Negative for palpitations and leg swelling.  Gastrointestinal: Negative for abdominal pain, nausea and vomiting.  Genitourinary: Negative for dysuria.  Musculoskeletal: Negative for joint swelling.  Skin: Negative for rash.  Neurological: Negative for headaches.  Hematological: Does not bruise/bleed easily.  Psychiatric/Behavioral: Negative for dysphoric mood. The patient is not nervous/anxious.        Objective:   Physical Exam Vitals:   02/13/21 1540  BP: 124/82  Pulse: (!) 103  Temp: 98.3 F (36.8 C)  TempSrc: Temporal  SpO2: 98%  Weight: 118 lb 9.6 oz (53.8 kg)  Height: 5\' 4"  (1.626 m)   Gen: Pleasant, well-nourished, in no distress, ill appearing  ENT: No lesions,  mouth clear,  oropharynx clear, nasal congestion, strong voice  Neck: No JVD, no stridor  Lungs: No use of accessory muscles, B exp wheezes  Cardiovascular: RRR, heart sounds normal, no murmur or gallops, no  peripheral edema  Musculoskeletal: No deformities, no cyanosis or clubbing  Neuro: alert, non focal  Skin: Warm, no lesions or rashes      Assessment & Plan:  COPD (chronic obstructive pulmonary disease) (HCC) Mild exacerbation with bilateral soft expiratory wheezes.  We will treat her with a short course of prednisone  to avoid side effects of anxiety which always bother her.  Continue current bronchodilator regimen  Continue Breztri 2 puffs twice a day.  Rinse and gargle after using. Continue your albuterol either 1 nebulizer treatment or 2 puffs up to every 4 hours if needed for shortness of breath, chest tightness, wheezing. Take prednisone 30 mg daily for 5 days and then stop. Follow with Dr Lamonte Sakai in 6 months or sooner if you have any problems  Chronic respiratory failure with hypoxia (HCC) Continue oxygen at all times  Pulmonary nodule 3.5 mm pulmonary nodule in the anterior right lower lobe documented by CT scan of the chest done on 12/13/2020 at [facility].  Plan to repeat in late March 2023 at [facility].  Tobacco abuse Smoking cessation discussed  Baltazar Apo, MD, PhD 02/13/2021, 4:10 PM [facility] [phone] or if no answer [phone]

## 2021-02-13 NOTE — Patient Instructions (Addendum)
Continue Breztri 2 puffs twice a day.  Rinse and gargle after using. Continue your albuterol either 1 nebulizer treatment or 2 puffs up to every 4 hours if needed for shortness of breath, chest tightness, wheezing. Continue your oxygen at all times as you have been using it. Take prednisone 30 mg daily for 5 days and then stop. We will plan to repeat your CT scan of the chest at [facility] in March 2023 to follow your small right lower lobe pulmonary nodule Congratulations on decreasing your cigarettes.  Continue to try to work to stop altogether. Follow with Dr Lamonte Sakai in 6 months or sooner if you have any problems

## 2021-02-13 NOTE — Addendum Note (Signed)
Addended by: Gavin Potters R on: 02/13/2021 04:19 PM   Modules accepted: Orders

## 2021-02-13 NOTE — Assessment & Plan Note (Signed)
3.5 mm pulmonary nodule in the anterior right lower lobe documented by CT scan of the chest done on 12/13/2020 at [facility].  Plan to repeat in late March 2023 at [facility].

## 2021-02-13 NOTE — Assessment & Plan Note (Signed)
Mild exacerbation with bilateral soft expiratory wheezes.  We will treat her with a short course of prednisone to avoid side effects of anxiety which always bother her.  Continue current bronchodilator regimen  Continue Breztri 2 puffs twice a day.  Rinse and gargle after using. Continue your albuterol either 1 nebulizer treatment or 2 puffs up to every 4 hours if needed for shortness of breath, chest tightness, wheezing. Take prednisone 30 mg daily for 5 days and then stop. Follow with Dr Lamonte Sakai in 6 months or sooner if you have any problems

## 2021-02-13 NOTE — Assessment & Plan Note (Signed)
Continue oxygen at all times 

## 2021-06-01 ENCOUNTER — Other Ambulatory Visit: Payer: Self-pay

## 2021-06-01 ENCOUNTER — Emergency Department (HOSPITAL_COMMUNITY): Payer: Medicare HMO

## 2021-06-01 ENCOUNTER — Observation Stay (HOSPITAL_COMMUNITY)
Admission: EM | Admit: 2021-06-01 | Discharge: 2021-06-02 | Disposition: A | Discharge status: Home / Self Care | Payer: Medicare HMO | Attending: Internal Medicine | Admitting: Internal Medicine

## 2021-06-01 DIAGNOSIS — F419 Anxiety disorder, unspecified: Secondary | ICD-10-CM | POA: Diagnosis not present

## 2021-06-01 DIAGNOSIS — R0602 Shortness of breath: Secondary | ICD-10-CM

## 2021-06-01 DIAGNOSIS — F17210 Nicotine dependence, cigarettes, uncomplicated: Secondary | ICD-10-CM | POA: Diagnosis not present

## 2021-06-01 DIAGNOSIS — K219 Gastro-esophageal reflux disease without esophagitis: Secondary | ICD-10-CM | POA: Diagnosis present

## 2021-06-01 DIAGNOSIS — J45909 Unspecified asthma, uncomplicated: Secondary | ICD-10-CM | POA: Diagnosis not present

## 2021-06-01 DIAGNOSIS — Z7982 Long term (current) use of aspirin: Secondary | ICD-10-CM | POA: Insufficient documentation

## 2021-06-01 DIAGNOSIS — K5900 Constipation, unspecified: Secondary | ICD-10-CM | POA: Diagnosis present

## 2021-06-01 DIAGNOSIS — J441 Chronic obstructive pulmonary disease with (acute) exacerbation: Secondary | ICD-10-CM | POA: Diagnosis not present

## 2021-06-01 DIAGNOSIS — E039 Hypothyroidism, unspecified: Secondary | ICD-10-CM | POA: Diagnosis not present

## 2021-06-01 DIAGNOSIS — J9621 Acute and chronic respiratory failure with hypoxia: Secondary | ICD-10-CM | POA: Diagnosis present

## 2021-06-01 DIAGNOSIS — Z79899 Other long term (current) drug therapy: Secondary | ICD-10-CM | POA: Diagnosis not present

## 2021-06-01 DIAGNOSIS — K5903 Drug induced constipation: Secondary | ICD-10-CM | POA: Diagnosis not present

## 2021-06-01 DIAGNOSIS — J9622 Acute and chronic respiratory failure with hypercapnia: Secondary | ICD-10-CM | POA: Insufficient documentation

## 2021-06-01 DIAGNOSIS — Z720 Tobacco use: Secondary | ICD-10-CM | POA: Diagnosis present

## 2021-06-01 DIAGNOSIS — Z20822 Contact with and (suspected) exposure to covid-19: Secondary | ICD-10-CM | POA: Diagnosis not present

## 2021-06-01 LAB — CBC
HCT: 43.9 % (ref 36.0–46.0)
Hemoglobin: 13.4 g/dL (ref 12.0–15.0)
MCH: 33 pg (ref 26.0–34.0)
MCHC: 30.5 g/dL (ref 30.0–36.0)
MCV: 108.1 fL — ABNORMAL HIGH (ref 80.0–100.0)
Platelets: 222 10*3/uL (ref 150–400)
RBC: 4.06 MIL/uL (ref 3.87–5.11)
RDW: 12.5 % (ref 11.5–15.5)
WBC: 5.6 10*3/uL (ref 4.0–10.5)
nRBC: 0 % (ref 0.0–0.2)

## 2021-06-01 LAB — BLOOD GAS, VENOUS
Acid-Base Excess: 16.1 mmol/L — ABNORMAL HIGH (ref 0.0–2.0)
Bicarbonate: 34 mmol/L — ABNORMAL HIGH (ref 20.0–28.0)
FIO2: 36
O2 Saturation: 33.6 %
Patient temperature: 37.1
pCO2, Ven: 103 mmHg (ref 44.0–60.0)
pH, Ven: 7.252 (ref 7.250–7.430)
pO2, Ven: <31 mmHg — CL (ref 32.0–45.0)

## 2021-06-01 LAB — COMPREHENSIVE METABOLIC PANEL
ALT: 29 U/L (ref 0–44)
AST: 20 U/L (ref 15–41)
Albumin: 3.7 g/dL (ref 3.5–5.0)
Alkaline Phosphatase: 65 U/L (ref 38–126)
Anion gap: 9 (ref 5–15)
BUN: 11 mg/dL (ref 6–20)
CO2: 41 mmol/L — ABNORMAL HIGH (ref 22–32)
Calcium: 8.5 mg/dL — ABNORMAL LOW (ref 8.9–10.3)
Chloride: 91 mmol/L — ABNORMAL LOW (ref 98–111)
Creatinine, Ser: 0.54 mg/dL (ref 0.44–1.00)
GFR, Estimated: >60 mL/min (ref >60)
Glucose, Bld: 106 mg/dL — ABNORMAL HIGH (ref 70–99)
Potassium: 3.9 mmol/L (ref 3.5–5.1)
Sodium: 141 mmol/L (ref 135–145)
Total Bilirubin: 0.7 mg/dL (ref 0.3–1.2)
Total Protein: 6.5 g/dL (ref 6.5–8.1)

## 2021-06-01 LAB — RESP PANEL BY RT-PCR (FLU A&B, COVID) ARPGX2
Influenza A by PCR: NEGATIVE
Influenza B by PCR: NEGATIVE
SARS Coronavirus 2 by RT PCR: NEGATIVE

## 2021-06-01 LAB — MAGNESIUM: Magnesium: 1.8 mg/dL (ref 1.7–2.4)

## 2021-06-01 LAB — TROPONIN I (HIGH SENSITIVITY)
Troponin I (High Sensitivity): 5 ng/L (ref <18)
Troponin I (High Sensitivity): 4 ng/L (ref <18)

## 2021-06-01 MED ORDER — LEVOTHYROXINE SODIUM 25 MCG PO TABS
25.0000 ug | ORAL_TABLET | Freq: Every day | ORAL | Status: DC
  Administered 2021-06-02: 25 ug via ORAL
  Filled 2021-06-01: qty 1

## 2021-06-01 MED ORDER — ONDANSETRON HCL 4 MG/2ML IJ SOLN: 4.0000 mg | Freq: Four times a day (QID) | INTRAMUSCULAR | Status: DC | PRN

## 2021-06-01 MED ORDER — GUAIFENESIN 100 MG/5ML PO SOLN: 200.0000 mg | Freq: Every evening | ORAL | Status: DC | PRN

## 2021-06-01 MED ORDER — HEPARIN SODIUM (PORCINE) 5000 UNIT/ML IJ SOLN
5000.0000 [IU] | Freq: Three times a day (TID) | INTRAMUSCULAR | Status: DC
  Administered 2021-06-02: 5000 [IU] via SUBCUTANEOUS
  Filled 2021-06-01: qty 1

## 2021-06-01 MED ORDER — METHYLNALTREXONE BROMIDE 12 MG/0.6ML ~~LOC~~ SOLN
12.0000 mg | Freq: Once | SUBCUTANEOUS | Status: AC
  Administered 2021-06-02: 12 mg via SUBCUTANEOUS
  Filled 2021-06-01: qty 0.6

## 2021-06-01 MED ORDER — IPRATROPIUM-ALBUTEROL 0.5-2.5 (3) MG/3ML IN SOLN
3.0000 mL | Freq: Once | RESPIRATORY_TRACT | Status: AC
  Administered 2021-06-01: 3 mL via RESPIRATORY_TRACT
  Filled 2021-06-01: qty 3

## 2021-06-01 MED ORDER — METHYLPREDNISOLONE SODIUM SUCC 125 MG IJ SOLR: 125.0000 mg | Freq: Every day | INTRAMUSCULAR | Status: DC

## 2021-06-01 MED ORDER — PANTOPRAZOLE SODIUM 40 MG PO TBEC: 40.0000 mg | DELAYED_RELEASE_TABLET | Freq: Every day | ORAL | Status: DC

## 2021-06-01 MED ORDER — OXYCODONE HCL 5 MG PO TABS: 15.0000 mg | ORAL_TABLET | Freq: Four times a day (QID) | ORAL | Status: DC | PRN

## 2021-06-01 MED ORDER — PREDNISONE 20 MG PO TABS
40.0000 mg | ORAL_TABLET | Freq: Every day | ORAL | Status: DC
Start: 2021-06-03 — End: 2021-06-02

## 2021-06-01 MED ORDER — ALBUTEROL (5 MG/ML) CONTINUOUS INHALATION SOLN
INHALATION_SOLUTION | RESPIRATORY_TRACT | Status: AC
  Administered 2021-06-01: 10 mg
  Filled 2021-06-01: qty 20

## 2021-06-01 MED ORDER — POLYETHYLENE GLYCOL 3350 17 G PO PACK
17.0000 g | PACK | Freq: Every day | ORAL | Status: DC | PRN
  Administered 2021-06-01: 17 g via ORAL
  Filled 2021-06-01: qty 1

## 2021-06-01 MED ORDER — ALBUTEROL SULFATE HFA 108 (90 BASE) MCG/ACT IN AERS: 2.0000 | INHALATION_SPRAY | RESPIRATORY_TRACT | Status: DC | PRN

## 2021-06-01 MED ORDER — PREGABALIN 75 MG PO CAPS: 300.0000 mg | ORAL_CAPSULE | Freq: Every day | ORAL | Status: DC

## 2021-06-01 MED ORDER — ACETAMINOPHEN 650 MG RE SUPP: 650.0000 mg | Freq: Four times a day (QID) | RECTAL | Status: DC | PRN

## 2021-06-01 MED ORDER — BUDESON-GLYCOPYRROL-FORMOTEROL 160-9-4.8 MCG/ACT IN AERO: 2.0000 | INHALATION_SPRAY | Freq: Every day | RESPIRATORY_TRACT | Status: DC

## 2021-06-01 MED ORDER — ALPRAZOLAM 0.5 MG PO TABS
1.0000 mg | ORAL_TABLET | Freq: Three times a day (TID) | ORAL | Status: DC | PRN
  Administered 2021-06-01: 1 mg via ORAL
  Filled 2021-06-01: qty 1

## 2021-06-01 MED ORDER — IPRATROPIUM-ALBUTEROL 0.5-2.5 (3) MG/3ML IN SOLN
3.0000 mL | Freq: Four times a day (QID) | RESPIRATORY_TRACT | Status: DC
  Administered 2021-06-02 (×2): 3 mL via RESPIRATORY_TRACT
  Filled 2021-06-01 (×2): qty 3

## 2021-06-01 MED ORDER — ACETAMINOPHEN 325 MG PO TABS: 650.0000 mg | ORAL_TABLET | Freq: Four times a day (QID) | ORAL | Status: DC | PRN

## 2021-06-01 MED ORDER — NICOTINE 21 MG/24HR TD PT24: 21.0000 mg | MEDICATED_PATCH | Freq: Every day | TRANSDERMAL | Status: DC

## 2021-06-01 MED ORDER — ALBUTEROL SULFATE (2.5 MG/3ML) 0.083% IN NEBU: 2.5000 mg | INHALATION_SOLUTION | RESPIRATORY_TRACT | Status: DC | PRN

## 2021-06-01 MED ORDER — ALBUTEROL SULFATE (2.5 MG/3ML) 0.083% IN NEBU
INHALATION_SOLUTION | RESPIRATORY_TRACT | Status: AC
  Administered 2021-06-01: 2.5 mg
  Filled 2021-06-01: qty 3

## 2021-06-01 MED ORDER — OXYCODONE HCL 5 MG PO TABS
5.0000 mg | ORAL_TABLET | ORAL | Status: DC | PRN
  Administered 2021-06-01 – 2021-06-02 (×2): 5 mg via ORAL
  Filled 2021-06-01 (×2): qty 1

## 2021-06-01 MED ORDER — ONDANSETRON HCL 4 MG PO TABS: 4.0000 mg | ORAL_TABLET | Freq: Four times a day (QID) | ORAL | Status: DC | PRN

## 2021-06-01 NOTE — ED Provider Notes (Signed)
[facility] EMERGENCY DEPARTMENT Provider Note   CSN: [id number] Arrival date & time: 06/01/21  1911     History Chief Complaint  Patient presents with   Shortness of Breath    Gloria Calderon is a 55 y.o. female with PMH COPD on intermittent home oxygen 3.5 L, fibromyalgia, hypothyroidism presents to the emergency department for evaluation of shortness of breath.  Patient states her shortness of breath began 3 days ago and has been progressively worsening.  She states that she lives in a "smokers den" surrounded by other smokers exacerbates her COPD.  She has tried her home albuterol nebulizers with no improvement.  She was given a single DuoNeb by EMS as well as 125 of Solu-Medrol and arrives with extended expiratory phase and persistent wheezing.  Denies chest pain, abdominal pain, nausea, vomiting or other systemic symptoms.   Shortness of Breath Associated symptoms: cough   Associated symptoms: no abdominal pain, no chest pain, no ear pain, no fever, no rash, no sore throat and no vomiting       Past Medical History:  Diagnosis Date   Anxiety    Arthritis    Asthma    Chronic back pain    Constipation 04/02/2017   COPD (chronic obstructive pulmonary disease) ([place])    DDD (degenerative disc disease), lumbar    Depression    Dyspnea    Fibromyalgia    GERD (gastroesophageal reflux disease)    H/O seasonal allergies    Headache    Hypothyroidism    Osteoporosis    Pneumonia 2008   Ringing in ear, right    Sciatica    Spinal stenosis     Patient Active Problem List   Diagnosis Date Noted   Pulmonary nodule 01/09/2021   Positive colorectal cancer screening using Cologuard test 11/17/2018   Pre-operative cardiovascular examination 06/15/2018   Angina, class III ([place]) 06/15/2018   DDD (degenerative disc disease), lumbar 06/14/2018   Vitamin D deficiency 06/14/2018   Hypothyroidism 06/14/2018   GERD (gastroesophageal reflux disease) 06/14/2018   Back pain  06/14/2018   Chronic respiratory failure with hypoxia ([place]) 01/12/2018   Anxiety disorder 01/12/2018   Polycythemia 07/20/2017   Constipation 04/02/2017   Tobacco abuse 07/29/2015   COPD (chronic obstructive pulmonary disease) ([place]) 10/11/2013   Thrush 10/11/2013    Past Surgical History:  Procedure Laterality Date   APPENDECTOMY     CHOLECYSTECTOMY     COLONOSCOPY WITH PROPOFOL N/A 03/25/2019   Procedure: COLONOSCOPY WITH PROPOFOL;  Surgeon: Rogene Houston, MD;  Location: [facility];  Service: Endoscopy;  Laterality: N/A;  11:05   LEFT HEART CATH AND CORONARY ANGIOGRAPHY N/A 06/17/2018   Procedure: LEFT HEART CATH AND CORONARY ANGIOGRAPHY;  Surgeon: Leonie Man, MD;  Location: [place] [facility];  Service: Cardiovascular;  Laterality: N/A;   POLYPECTOMY  03/25/2019   Procedure: POLYPECTOMY;  Surgeon: Rogene Houston, MD;  Location: [facility];  Service: Endoscopy;;  Hepatic flexure, sigmoid colon     OB History     Gravida  1   Para  1   Term  1   Preterm      AB      Living  1      SAB      IAB      Ectopic      Multiple      Live Births              Family History  Problem Relation Age of Onset   COPD Father    Diabetes Mellitus II Mother     Social History   Tobacco Use   Smoking status: Every Day    Packs/day: 2.50    Years: 44.00    Pack years: 110.00    Types: Cigarettes, E-cigarettes    Start date: 1977   Smokeless tobacco: Never   Tobacco comments:    3-4 cigarettes smoked daily [facility] 02/13/21  Vaping Use   Vaping Use: Former   Substances: Nicotine, CBD  Substance Use Topics   Alcohol use: Yes    Alcohol/week: 4.0 standard drinks    Types: 4 Cans of beer per week    Comment: occasionally   Drug use: No    Home Medications Prior to Admission medications   Medication Sig Start Date End Date Taking? Authorizing Provider  acetaminophen (TYLENOL) 650 MG CR tablet Take 1,300 mg by mouth 2 (two) times daily.    [provider]  albuterol (VENTOLIN HFA) 108 (90 Base) MCG/ACT inhaler 2 PUFFS EVERY 6 HOURS AS NEEDED FOR WHEEZING OR SHORTNESS OF BREATH Patient taking differently: Inhale 2 puffs into the lungs every 6 (six) hours as needed for wheezing or shortness of breath. 02/17/19   Collene Gobble, MD  ALPRAZolam Duanne Moron) 1 MG tablet Take 1 tablet (1 mg total) by mouth 4 (four) times daily as needed for anxiety. Patient taking differently: Take 1 mg by mouth 3 (three) times daily as needed for anxiety. 01/12/18   Collene Gobble, MD  Aspirin-Acetaminophen-Caffeine 520-260-32.5 MG PACK Take by mouth.    [provider]  Aspirin-Salicylamide-Caffeine (BC HEADACHE POWDER PO) Take 1 Package by mouth daily.    [provider]  bisacodyl (DULCOLAX) 5 MG EC tablet Take 5 mg by mouth daily as needed for moderate constipation.     [provider]  Budeson-Glycopyrrol-Formoterol (BREZTRI AEROSPHERE) 160-9-4.8 MCG/ACT AERO Inhale 2 puffs into the lungs in the Calderon and at bedtime. 01/16/21   Collene Gobble, MD  Carboxymethylcellulose Sod PF 0.5 % SOLN Place 1 drop into both eyes 3 (three) times daily.     [provider]  celecoxib (CELEBREX) 200 MG capsule Take 200 mg by mouth 2 (two) times daily. 09/18/20   [provider]  Cholecalciferol (VITAMIN D) 125 MCG (5000 UT) CAPS Take 1,500 Units by mouth daily.    [provider]  COLLAGEN PO Take 1 tablet by mouth daily at 6 (six) AM.    [provider]  cyclobenzaprine (FLEXERIL) 10 MG tablet Take 30 mg by mouth 3 (three) times daily.     [provider]  fluticasone (FLONASE) 50 MCG/ACT nasal spray Place 2 sprays into both nostrils daily.    [provider]  guaifenesin (ROBITUSSIN) 100 MG/5ML syrup Take 200 mg by mouth at bedtime as needed for cough.    [provider]  ipratropium-albuterol (DUONEB) 0.5-2.5 (3) MG/3ML SOLN NEBULIZE 1 VIAL EVERY 6 HOURS AS NEEDED FOR SHORTNESS OF  BREATH Patient taking differently: Take 3 mLs by nebulization every 4 (four) hours as needed. 06/14/19   Collene Gobble, MD  levothyroxine (SYNTHROID, LEVOTHROID) 25 MCG tablet Take 25 mcg by mouth daily before breakfast.  03/07/17   [provider]  Multiple Vitamins-Minerals (HAIR SKIN NAILS PO) Take 1 tablet by mouth daily.    [provider]  oxyCODONE (ROXICODONE) 15 MG immediate release tablet Take 15 mg by mouth in the Calderon, at noon, and at  bedtime.    [provider]  OXYGEN Inhale 2.5-3 L into the lungs continuous.     [provider]  pantoprazole (PROTONIX) 40 MG tablet Take 40 mg by mouth daily. 06/21/20   [provider]  pregabalin (LYRICA) 150 MG capsule Take 300 mg by mouth daily.     [provider]  Probiotic Product ([employer] ADV PROBIOTIC GUMMIES PO) Take 1 tablet by mouth daily.    [provider]  psyllium (METAMUCIL) 58.6 % powder Take 1 packet by mouth daily.    [provider]  Spacer/Aero-Holding Dorise Bullion Use daily with inhaler 12/31/20   Collene Gobble, MD    Allergies    Isosorbide, Levaquin [levofloxacin], and Morphine and related  Review of Systems   Review of Systems  Constitutional:  Negative for chills and fever.  HENT:  Negative for ear pain and sore throat.   Eyes:  Negative for pain and visual disturbance.  Respiratory:  Positive for cough and shortness of breath.   Cardiovascular:  Negative for chest pain and palpitations.  Gastrointestinal:  Negative for abdominal pain and vomiting.  Genitourinary:  Negative for dysuria and hematuria.  Musculoskeletal:  Negative for arthralgias and back pain.  Skin:  Negative for color change and rash.  Neurological:  Negative for seizures and syncope.  All other systems reviewed and are negative.  Physical Exam Updated Vital Signs BP 118/74   Pulse 98   Temp 98.5 F (36.9 C)   Resp 13   Ht '5\' 4"'$  (1.626 m)   Wt 53.8 kg   SpO2 92%    BMI 20.36 kg/m   Physical Exam Vitals and nursing note reviewed.  Constitutional:      General: She is not in acute distress.    Appearance: She is well-developed.  HENT:     Head: Normocephalic and atraumatic.  Eyes:     Conjunctiva/sclera: Conjunctivae normal.  Cardiovascular:     Rate and Rhythm: Normal rate and regular rhythm.     Heart sounds: No murmur heard. Pulmonary:     Effort: Pulmonary effort is normal. No respiratory distress.     Breath sounds: Wheezing present.  Abdominal:     Palpations: Abdomen is soft.     Tenderness: There is no abdominal tenderness.  Musculoskeletal:     Cervical back: Neck supple.  Skin:    General: Skin is warm and dry.  Neurological:     Mental Status: She is alert.    ED Results / Procedures / Treatments   Labs (all labs ordered are listed, but only abnormal results are displayed) Labs Reviewed  CBC - Abnormal; Notable for the following components:      Result Value   MCV 108.1 (*)    All other components within normal limits  COMPREHENSIVE METABOLIC PANEL - Abnormal; Notable for the following components:   Chloride 91 (*)    CO2 41 (*)    Glucose, Bld 106 (*)    Calcium 8.5 (*)    All other components within normal limits  RESP PANEL BY RT-PCR (FLU A&B, COVID) ARPGX2  TROPONIN I (HIGH SENSITIVITY)    EKG None  Radiology DG Chest Port 1 View  Result Date: 06/01/2021 CLINICAL DATA:  Shortness of breath. Hx of asthma, COPD, pneumonia, and current smoker. Being tested for covid-19. EXAM: PORTABLE CHEST 1 VIEW COMPARISON:  Chest x-ray 10/26/2019 FINDINGS: The heart and mediastinal contours are within normal limits. No focal consolidation. Cystic changes more prominent in  the upper lung zone. Coarsened interstitial markings with no overt pulmonary edema. Blunting of bilateral costophrenic angles with trace pleural effusions not excluded. No pneumothorax. No acute osseous abnormality. IMPRESSION: 1. Bilateral trace pleural  effusions not excluded. 2. Bronchitic changes. 3. Aortic Atherosclerosis (ICD10-I70.0) and Emphysema (ICD10-J43.9). Electronically Signed   By: Iven Finn M.D.   On: 06/01/2021 20:00    Procedures Procedures   Medications Ordered in ED Medications  ipratropium-albuterol (DUONEB) 0.5-2.5 (3) MG/3ML nebulizer solution 3 mL (3 mLs Nebulization Given 06/01/21 2024)  albuterol (PROVENTIL) (2.5 MG/3ML) 0.083% nebulizer solution (2.5 mg  Given 06/01/21 2024)  albuterol (VENTOLIN) (5 MG/ML) 0.5% continuous inhalation solution (10 mg  Given 06/01/21 2048)    ED Course  I have reviewed the triage vital signs and the nursing notes.  Pertinent labs & imaging results that were available during my care of the patient were reviewed by me and considered in my medical decision making (see chart for details).    MDM Rules/Calculators/A&P                           Patient seen the emergency department for evaluation of shortness of breath.  Physical exam reveals a significantly prolonged expiratory phase and an expiratory wheezing.  Physical exam otherwise unremarkable.  Laboratory evaluation reveals a CO2 of 41 but is otherwise unremarkable.  COVID-negative, flu negative, initial troponin unremarkable.  ECG nonischemic.  Patient received 2 additional DuoNeb's here in the emergency department and has persistent wheezing.  Patient given a 1 hour albuterol bursts and will require admission for persistent COPD exacerbation.  Patient admitted. Final Clinical Impression(s) / ED Diagnoses Final diagnoses:  SOB (shortness of breath)    Rx / DC Orders ED Discharge Orders     None        Samon Dishner, Debe Coder, MD 06/01/21 2112

## 2021-06-01 NOTE — H&P (Signed)
TRH H&P    Patient Demographics:    Gloria Calderon, is a 55 y.o. female  MRN: [id number]  DOB - 07/21/1966  Admit Date - 06/01/2021  Referring MD/NP/PA: Kommor  Outpatient Primary MD for the patient is Jaynee Eagles, MD  Patient coming from: home  Chief complaint- dyspnea   HPI:    Gloria Calderon  is a 55 y.o. female, with history of asthma, constipation, COPD, depression, anxiety, fibromyalgia, GERD, hypothyroidism, and more presents the ED with a chief complaint of dyspnea.  Started 3 days ago and has been worsening since it started.  Patient does wear 3 L nasal cannula at home.  She reports that she has not felt like she is able to breathe with her baseline oxygen recently.  She has had associated palpitations and chest pain.  She also reports that she has anxiety attacks for which she takes Xanax 3 times a day, and associates her chest pain and palpitations with these anxiety attacks.  Patient is taking breathing treatments without any relief.  She used a DuoNeb 1 time prior to coming into the ED.  Patient reports that she does not remember the trip over to the hospital.  She does not think she passed out but she is confused about what happened on the way here.  This is likely because her PCO2 is 103.  Patient has had a productive cough with dark brown sputum.  This is a change in color from her normal sputum which is clear.  Patient reports that she has rib pain from coughing, and reports that she has had a history of a cracked rib from coughing.  She denies any fever.  Patient is very upset that her family is continuing to smoke around her.  She lives with her mother and her mother's boyfriend, and they both are smokers.  Patient thinks that this means that they do not care about her.  The flip side of that is patient is also a smoker.  She is trying to quit.  Her only other possible trigger of COPD exacerbation  aside from smoking is a new exposure to a new pet cat.  She has not had a history of allergies to cats in the past.  Patient has no other complaints at this time.  Patient is a current smoker, does not want a nicotine patch.  She does not drink alcohol, she does not use illicit drugs, she is vaccinated for COVID.  Patient is full code.  In the ED Temp 98.5, heart rate 82-1 06, respiratory rate 13-25, blood pressure 118/74, satting mid 90s on 4 L nasal cannula EKG shows a heart rate of 97, sinus rhythm, QTC 431, no acute ST changes Respiratory panel negative No leukocytosis, chemistry panel mostly unremarkable Chest x-ray shows bilateral pleural effusion and bronchitic changes Patient was given 125 Solu-Medrol and a DuoNeb in route to the ED Patient got 2 more DuoNeb's in the ED and then an hour-long burst of albuterol Admission for COPD exacerbation  Of note patient reports that the person that  she would like contacted is her sister Bebe Liter -she attempted to dial this person while was in the room, but there was no answer.  The number is [phone]    Review of systems:    In addition to the HPI above,  No Fever-chills, No Headache, No changes with Vision or hearing, No problems swallowing food or Liquids, No Abdominal pain, No Nausea or Vomiting, bowel movements are regular, No Blood in stool or Urine, No dysuria, No new skin rashes or bruises, No new joints pains-aches,  No new weakness, tingling, numbness in any extremity, No recent weight gain or loss, No polyuria, polydypsia or polyphagia,   All other systems reviewed and are negative.    Past History of the following :    Past Medical History:  Diagnosis Date   Anxiety    Arthritis    Asthma    Chronic back pain    Constipation 04/02/2017   COPD (chronic obstructive pulmonary disease) (HCC)    DDD (degenerative disc disease), lumbar    Depression    Dyspnea    Fibromyalgia    GERD (gastroesophageal reflux  disease)    H/O seasonal allergies    Headache    Hypothyroidism    Osteoporosis    Pneumonia 2008   Ringing in ear, right    Sciatica    Spinal stenosis       Past Surgical History:  Procedure Laterality Date   APPENDECTOMY     CHOLECYSTECTOMY     COLONOSCOPY WITH PROPOFOL N/A 03/25/2019   Procedure: COLONOSCOPY WITH PROPOFOL;  Surgeon: Rogene Houston, MD;  [facility];  Service: Endoscopy;  Laterality: N/A;  11:05   LEFT HEART CATH AND CORONARY ANGIOGRAPHY N/A 06/17/2018   Procedure: LEFT HEART CATH AND CORONARY ANGIOGRAPHY;  Surgeon: Leonie Man, MD;  Location: [facility];  Service: Cardiovascular;  Laterality: N/A;   POLYPECTOMY  03/25/2019   Procedure: POLYPECTOMY;  Surgeon: Rogene Houston, MD;  Location: [facility];  Service: Endoscopy;;  Hepatic flexure, sigmoid colon      Social History:      Social History   Tobacco Use   Smoking status: Every Day    Packs/day: 2.50    Years: 44.00    Pack years: 110.00    Types: Cigarettes, E-cigarettes    Start date: 1977   Smokeless tobacco: Never   Tobacco comments:    3-4 cigarettes smoked daily [facility] 02/13/21  Substance Use Topics   Alcohol use: Yes    Alcohol/week: 4.0 standard drinks    Types: 4 Cans of beer per week    Comment: occasionally       Family History :     Family History  Problem Relation Age of Onset   COPD Father    Diabetes Mellitus II Mother       Home Medications:   Prior to Admission medications   Medication Sig Start Date End Date Taking? Authorizing Provider  acetaminophen (TYLENOL) 650 MG CR tablet Take 1,300 mg by mouth 2 (two) times daily.    [provider]  albuterol (VENTOLIN HFA) 108 (90 Base) MCG/ACT inhaler 2 PUFFS EVERY 6 HOURS AS NEEDED FOR WHEEZING OR SHORTNESS OF BREATH Patient taking differently: Inhale 2 puffs into the lungs every 6 (six) hours as needed for wheezing or shortness of breath. 02/17/19   Collene Gobble, MD  ALPRAZolam  Duanne Moron) 1 MG tablet Take 1 tablet (1 mg total) by mouth 4 (  four) times daily as needed for anxiety. Patient taking differently: Take 1 mg by mouth 3 (three) times daily as needed for anxiety. 01/12/18   Collene Gobble, MD  Aspirin-Acetaminophen-Caffeine 520-260-32.5 MG PACK Take by mouth.    [provider]  Aspirin-Salicylamide-Caffeine (BC HEADACHE POWDER PO) Take 1 Package by mouth daily.    [provider]  bisacodyl (DULCOLAX) 5 MG EC tablet Take 5 mg by mouth daily as needed for moderate constipation.     [provider]  Budeson-Glycopyrrol-Formoterol (BREZTRI AEROSPHERE) 160-9-4.8 MCG/ACT AERO Inhale 2 puffs into the lungs in the morning and at bedtime. 01/16/21   Collene Gobble, MD  Carboxymethylcellulose Sod PF 0.5 % SOLN Place 1 drop into both eyes 3 (three) times daily.     [provider]  celecoxib (CELEBREX) 200 MG capsule Take 200 mg by mouth 2 (two) times daily. 09/18/20   [provider]  Cholecalciferol (VITAMIN D) 125 MCG (5000 UT) CAPS Take 1,500 Units by mouth daily.    [provider]  COLLAGEN PO Take 1 tablet by mouth daily at 6 (six) AM.    [provider]  cyclobenzaprine (FLEXERIL) 10 MG tablet Take 30 mg by mouth 3 (three) times daily.     [provider]  fluticasone (FLONASE) 50 MCG/ACT nasal spray Place 2 sprays into both nostrils daily.    [provider]  guaifenesin (ROBITUSSIN) 100 MG/5ML syrup Take 200 mg by mouth at bedtime as needed for cough.    [provider]  ipratropium-albuterol (DUONEB) 0.5-2.5 (3) MG/3ML SOLN NEBULIZE 1 VIAL EVERY 6 HOURS AS NEEDED FOR SHORTNESS OF BREATH Patient taking differently: Take 3 mLs by nebulization every 4 (four) hours as needed. 06/14/19   Collene Gobble, MD  levothyroxine (SYNTHROID, LEVOTHROID) 25 MCG tablet Take 25 mcg by mouth daily before breakfast.  03/07/17   [provider]  Multiple Vitamins-Minerals (HAIR SKIN NAILS PO)  Take 1 tablet by mouth daily.    [provider]  oxyCODONE (ROXICODONE) 15 MG immediate release tablet Take 15 mg by mouth in the morning, at noon, and at bedtime.    [provider]  OXYGEN Inhale 2.5-3 L into the lungs continuous.     [provider]  pantoprazole (PROTONIX) 40 MG tablet Take 40 mg by mouth daily. 06/21/20   [provider]  pregabalin (LYRICA) 150 MG capsule Take 300 mg by mouth daily.     [provider]  Probiotic Product ([employer] ADV PROBIOTIC GUMMIES PO) Take 1 tablet by mouth daily.    [provider]  psyllium (METAMUCIL) 58.6 % powder Take 1 packet by mouth daily.    [provider]  Spacer/Aero-Holding Dorise Bullion Use daily with inhaler 12/31/20   Collene Gobble, MD     Allergies:     Allergies  Allergen Reactions   Isosorbide Nausea And Vomiting and Swelling    Severe headache   Levaquin [Levofloxacin] Hives   Morphine And Related Itching and Nausea And Vomiting     Physical Exam:   Vitals  Blood pressure 118/74, pulse 98, temperature 98.5 F (36.9 C), resp. rate 13, height '5\' 4"'$  (1.626 m), weight 53.8 kg, SpO2 92 %.  1.  General: Patient lying supine in bed,  no acute distress   2. Psychiatric: Alert and oriented x 3, tearful, anxious, and randomly talking about losing her husband 2 years ago, and that her family does not care about her, pleasant and cooperative with  exam   3. Neurologic: Speech and language are normal, face is symmetric, moves all 4 extremities voluntarily, at baseline without acute deficits on limited exam   4. HEENMT:  Head is atraumatic, normocephalic, pupils reactive to light, neck is supple, trachea is midline, mucous membranes are moist   5. Respiratory : Lungs are with wheezing bilaterally, no rhonchi, rales, no cyanosis, no increase in work of breathing or accessory muscle use   6. Cardiovascular : Heart rate slightly  tachycardic at admission, rhythm is  regular, no murmurs, rubs or gallops, no peripheral edema, peripheral pulses palpated   7. Gastrointestinal:  Abdomen is soft, nondistended, nontender to palpation bowel sounds active, no masses or organomegaly palpated   8. Skin:  Skin is warm, dry and intact without rashes, acute lesions, or ulcers on limited exam   9.Musculoskeletal:  No acute deformities or trauma, no asymmetry in tone, no peripheral edema, peripheral pulses palpated, no tenderness to palpation in the extremities     Data Review:    CBC Recent Labs  Lab 06/01/21 2000  WBC 5.6  HGB 13.4  HCT 43.9  PLT 222  MCV 108.1*  MCH 33.0  MCHC 30.5  RDW 12.5   ------------------------------------------------------------------------------------------------------------------  Results for orders placed or performed during the hospital encounter of 06/01/21 (from the past 48 hour(s))  Resp Panel by RT-PCR (Flu A&B, Covid) Nasopharyngeal Swab     Status: None   Collection Time: 06/01/21  7:22 PM   Specimen: Nasopharyngeal Swab; Nasopharyngeal(NP) swabs in vial transport medium  Result Value Ref Range   SARS Coronavirus 2 by RT PCR NEGATIVE NEGATIVE    Comment: (NOTE) SARS-CoV-2 target nucleic acids are NOT DETECTED.  The SARS-CoV-2 RNA is generally detectable in upper respiratory specimens during the acute phase of infection. The lowest concentration of SARS-CoV-2 viral copies this assay can detect is 138 copies/mL. A negative result does not preclude SARS-Cov-2 infection and should not be used as the sole basis for treatment or other patient management decisions. A negative result may occur with  improper specimen collection/handling, submission of specimen other than nasopharyngeal swab, presence of viral mutation(s) within the areas targeted by this assay, and inadequate number of viral copies(<138 copies/mL). A negative result must be combined with clinical observations, patient history, and  epidemiological information. The expected result is Negative.  Fact Sheet for Patients:  EntrepreneurPulse.com.au  Fact Sheet for Healthcare Providers:  IncredibleEmployment.be  This test is no t yet approved or cleared by the [place] FDA and  has been authorized for detection and/or diagnosis of SARS-CoV-2 by FDA under an Emergency Use Authorization (EUA). This EUA will remain  in effect (meaning this test can be used) for the duration of the COVID-19 declaration under Section 564(b)(1) of the Act, 21 U.S.C.section 360bbb-3(b)(1), unless the authorization is terminated  or revoked sooner.       Influenza A by PCR NEGATIVE NEGATIVE   Influenza B by PCR NEGATIVE NEGATIVE    Comment: (NOTE) The Xpert Xpress SARS-CoV-2/FLU/RSV plus assay is intended as an aid in the diagnosis of influenza from Nasopharyngeal swab specimens and should not be used as a sole basis for treatment. Nasal washings and aspirates are unacceptable for Xpert Xpress SARS-CoV-2/FLU/RSV testing.  Fact Sheet for Patients: EntrepreneurPulse.com.au  Fact Sheet for Healthcare Providers: IncredibleEmployment.be  This test is not yet approved or cleared by the [place] FDA and has been authorized for detection and/or diagnosis of SARS-CoV-2 by FDA under an Emergency Use Authorization (EUA). This  EUA will remain in effect (meaning this test can be used) for the duration of the COVID-19 declaration under Section 564(b)(1) of the Act, 21 U.S.C. section 360bbb-3(b)(1), unless the authorization is terminated or revoked.  Performed at [facility], [address], [place], [place] [address]   CBC     Status: Abnormal   Collection Time: 06/01/21  8:00 PM  Result Value Ref Range   WBC 5.6 4.0 - 10.5 K/uL   RBC 4.06 3.87 - 5.11 MIL/uL   Hemoglobin 13.4 12.0 - 15.0 g/dL   HCT 43.9 36.0 - 46.0 %   MCV 108.1 (H) 80.0 - 100.0 fL   MCH  33.0 26.0 - 34.0 pg   MCHC 30.5 30.0 - 36.0 g/dL   RDW 12.5 11.5 - 15.5 %   Platelets 222 150 - 400 K/uL   nRBC 0.0 0.0 - 0.2 %    Comment: Performed at [facility], [address] [place], [place] [address]  Comprehensive metabolic panel     Status: Abnormal   Collection Time: 06/01/21  8:01 PM  Result Value Ref Range   Sodium 141 135 - 145 mmol/L   Potassium 3.9 3.5 - 5.1 mmol/L   Chloride 91 (L) 98 - 111 mmol/L   CO2 41 (H) 22 - 32 mmol/L   Glucose, Bld 106 (H) 70 - 99 mg/dL    Comment: Glucose reference range applies only to samples taken after fasting for at least 8 hours.   BUN 11 6 - 20 mg/dL   Creatinine, Ser 0.54 0.44 - 1.00 mg/dL   Calcium 8.5 (L) 8.9 - 10.3 mg/dL   Total Protein 6.5 6.5 - 8.1 g/dL   Albumin 3.7 3.5 - 5.0 g/dL   AST 20 15 - 41 U/L   ALT 29 0 - 44 U/L   Alkaline Phosphatase 65 38 - 126 U/L   Total Bilirubin 0.7 0.3 - 1.2 mg/dL   GFR, Estimated >60 >60 mL/min    Comment: (NOTE) Calculated using the CKD-EPI Creatinine Equation (2021)    Anion gap 9 5 - 15    Comment: Performed at [facility], [address], [place], [place] [address]  Troponin I (High Sensitivity)     Status: None   Collection Time: 06/01/21  8:01 PM  Result Value Ref Range   Troponin I (High Sensitivity) 5 <18 ng/L    Comment: (NOTE) Elevated high sensitivity troponin I (hsTnI) values and significant  changes across serial measurements may suggest ACS but many other  chronic and acute conditions are known to elevate hsTnI results.  Refer to the "Links" section for chest pain algorithms and additional  guidance. Performed at [facility], [address], [place], [place] [address]     Chemistries  Recent Labs  Lab 06/01/21 2001  NA 141  K 3.9  CL 91*  CO2 41*  GLUCOSE 106*  BUN 11  CREATININE 0.54  CALCIUM 8.5*  AST 20  ALT 29  ALKPHOS 65  BILITOT 0.7    ------------------------------------------------------------------------------------------------------------------  ------------------------------------------------------------------------------------------------------------------ GFR: Estimated Creatinine Clearance: 67.5 mL/min (by C-G formula based on SCr of 0.54 mg/dL). Liver Function Tests: Recent Labs  Lab 06/01/21 2001  AST 20  ALT 29  ALKPHOS 65  BILITOT 0.7  PROT 6.5  ALBUMIN 3.7   No results for input(s): LIPASE, AMYLASE in the last 168 hours. No results for input(s): AMMONIA in the last 168 hours. Coagulation Profile: No results for input(s): INR, PROTIME in the last 168 hours. Cardiac Enzymes:  No results for input(s): CKTOTAL, CKMB, CKMBINDEX, TROPONINI in the last 168 hours. BNP (last 3 results) No results for input(s): PROBNP in the last 8760 hours. HbA1C: No results for input(s): HGBA1C in the last 72 hours. CBG: No results for input(s): GLUCAP in the last 168 hours. Lipid Profile: No results for input(s): CHOL, HDL, LDLCALC, TRIG, CHOLHDL, LDLDIRECT in the last 72 hours. Thyroid Function Tests: No results for input(s): TSH, T4TOTAL, FREET4, T3FREE, THYROIDAB in the last 72 hours. Anemia Panel: No results for input(s): VITAMINB12, FOLATE, FERRITIN, TIBC, IRON, RETICCTPCT in the last 72 hours.  --------------------------------------------------------------------------------------------------------------- Urine analysis:    Component Value Date/Time   COLORURINE COLORLESS (A) 10/24/2020 1159   APPEARANCEUR CLEAR 10/24/2020 1159   LABSPEC 1.001 (L) 10/24/2020 1159   PHURINE 8.0 10/24/2020 1159   GLUCOSEU NEGATIVE 10/24/2020 1159   HGBUR NEGATIVE 10/24/2020 1159   BILIRUBINUR NEGATIVE 10/24/2020 1159   KETONESUR NEGATIVE 10/24/2020 1159   PROTEINUR NEGATIVE 10/24/2020 1159   NITRITE NEGATIVE 10/24/2020 1159   LEUKOCYTESUR NEGATIVE 10/24/2020 1159      Imaging Results:    DG Chest Port 1  View  Result Date: 06/01/2021 CLINICAL DATA:  Shortness of breath. Hx of asthma, COPD, pneumonia, and current smoker. Being tested for covid-19. EXAM: PORTABLE CHEST 1 VIEW COMPARISON:  Chest x-ray 10/26/2019 FINDINGS: The heart and mediastinal contours are within normal limits. No focal consolidation. Cystic changes more prominent in the upper lung zone. Coarsened interstitial markings with no overt pulmonary edema. Blunting of bilateral costophrenic angles with trace pleural effusions not excluded. No pneumothorax. No acute osseous abnormality. IMPRESSION: 1. Bilateral trace pleural effusions not excluded. 2. Bronchitic changes. 3. Aortic Atherosclerosis (ICD10-I70.0) and Emphysema (ICD10-J43.9). Electronically Signed   By: Iven Finn M.D.   On: 06/01/2021 20:00       Assessment & Plan:    Active Problems:   Tobacco abuse   Constipation   Anxiety disorder   Hypothyroidism   GERD (gastroesophageal reflux disease)   COPD with acute exacerbation (HCC)   Acute on chronic respiratory failure with hypoxia and hypercapnia (HCC)   COPD exacerbation VBG done at admission showed a pH of 7.2, PCO2 33 Started on BiPAP Continue Solu-Medrol, scheduled DuoNebs, as needed albuterol Continue to monitor Respiratory failure with hypoxia and hypercapnia Treat as above Hypothyroidism Continue Synthroid Chronic pain Continue home opiate medication-15 mg of oxycodone every 6 hours as needed Continue Lyrica Constipation Likely opiate induced Several laxatives on home med list Patient reports not having a bowel movement in more than a week MiraLAX as needed, Relistor ordered GERD Continue PPI Anxiety disorder Continue Xanax Tobacco use disorder Extensive counseling in the room greater than 10 minutes regarding the importance of smoking cessation Patient reports that she is trying to quit  DVT Prophylaxis-   Heparin- SCDs   AM Labs Ordered, also please review Full Orders  Family  Communication: Admission, patients condition and plan of care including tests being ordered have been discussed with the patient and attempt was made to call sister who did not answer the phone.  Patient reports understanding and agrees with the plan and Code Status.  Code Status: Full  Admission status: Observation Time spent in minutes : [place]

## 2021-06-01 NOTE — ED Triage Notes (Signed)
Rcems from home with cc of sob. Hx of copd. Wears 3.5 l Marvin at home. Ems gave albuterol and atrovent neb. And 125 mg solumedrol.  Initially hard to make complete sentences, appropriate now. Alert and oriented x4

## 2021-06-01 NOTE — ED Notes (Signed)
XR at bedside

## 2021-06-02 ENCOUNTER — Observation Stay (HOSPITAL_COMMUNITY): Payer: Medicare HMO

## 2021-06-02 DIAGNOSIS — Z20822 Contact with and (suspected) exposure to covid-19: Secondary | ICD-10-CM | POA: Diagnosis not present

## 2021-06-02 DIAGNOSIS — J9621 Acute and chronic respiratory failure with hypoxia: Secondary | ICD-10-CM | POA: Diagnosis present

## 2021-06-02 DIAGNOSIS — F17210 Nicotine dependence, cigarettes, uncomplicated: Secondary | ICD-10-CM | POA: Diagnosis not present

## 2021-06-02 DIAGNOSIS — J9622 Acute and chronic respiratory failure with hypercapnia: Secondary | ICD-10-CM | POA: Diagnosis not present

## 2021-06-02 DIAGNOSIS — E039 Hypothyroidism, unspecified: Secondary | ICD-10-CM | POA: Diagnosis not present

## 2021-06-02 DIAGNOSIS — J441 Chronic obstructive pulmonary disease with (acute) exacerbation: Secondary | ICD-10-CM | POA: Diagnosis not present

## 2021-06-02 LAB — CBC
HCT: 42.9 % (ref 36.0–46.0)
Hemoglobin: 13 g/dL (ref 12.0–15.0)
MCH: 32.3 pg (ref 26.0–34.0)
MCHC: 30.3 g/dL (ref 30.0–36.0)
MCV: 106.5 fL — ABNORMAL HIGH (ref 80.0–100.0)
Platelets: 221 10*3/uL (ref 150–400)
RBC: 4.03 MIL/uL (ref 3.87–5.11)
RDW: 12.3 % (ref 11.5–15.5)
WBC: 5.2 10*3/uL (ref 4.0–10.5)
nRBC: 0 % (ref 0.0–0.2)

## 2021-06-02 LAB — BLOOD GAS, VENOUS
Acid-Base Excess: 16.1 mmol/L — ABNORMAL HIGH (ref 0.0–2.0)
Bicarbonate: 36.3 mmol/L — ABNORMAL HIGH (ref 20.0–28.0)
FIO2: 36
O2 Saturation: 44.2 %
Patient temperature: 36.7
pCO2, Ven: 67.9 mmHg — ABNORMAL HIGH (ref 44.0–60.0)
pH, Ven: 7.405 (ref 7.250–7.430)
pO2, Ven: <31 mmHg — CL (ref 32.0–45.0)

## 2021-06-02 LAB — COMPREHENSIVE METABOLIC PANEL
ALT: 29 U/L (ref 0–44)
AST: 21 U/L (ref 15–41)
Albumin: 3.8 g/dL (ref 3.5–5.0)
Alkaline Phosphatase: 68 U/L (ref 38–126)
Anion gap: 12 (ref 5–15)
BUN: 12 mg/dL (ref 6–20)
CO2: 37 mmol/L — ABNORMAL HIGH (ref 22–32)
Calcium: 8.7 mg/dL — ABNORMAL LOW (ref 8.9–10.3)
Chloride: 90 mmol/L — ABNORMAL LOW (ref 98–111)
Creatinine, Ser: 0.55 mg/dL (ref 0.44–1.00)
GFR, Estimated: >60 mL/min (ref >60)
Glucose, Bld: 151 mg/dL — ABNORMAL HIGH (ref 70–99)
Potassium: 3.7 mmol/L (ref 3.5–5.1)
Sodium: 139 mmol/L (ref 135–145)
Total Bilirubin: 0.5 mg/dL (ref 0.3–1.2)
Total Protein: 6.6 g/dL (ref 6.5–8.1)

## 2021-06-02 LAB — MRSA NEXT GEN BY PCR, NASAL: MRSA by PCR Next Gen: NOT DETECTED

## 2021-06-02 LAB — HIV ANTIBODY (ROUTINE TESTING W REFLEX): HIV Screen 4th Generation wRfx: NONREACTIVE

## 2021-06-02 LAB — TROPONIN I (HIGH SENSITIVITY): Troponin I (High Sensitivity): 7 ng/L (ref <18)

## 2021-06-02 MED ORDER — UMECLIDINIUM BROMIDE 62.5 MCG/INH IN AEPB
1.0000 | INHALATION_SPRAY | Freq: Every day | RESPIRATORY_TRACT | Status: DC
  Administered 2021-06-02: 1 via RESPIRATORY_TRACT
  Filled 2021-06-02: qty 7

## 2021-06-02 MED ORDER — BUDESONIDE 0.5 MG/2ML IN SUSP: 0.5000 mg | Freq: Two times a day (BID) | RESPIRATORY_TRACT | Status: DC

## 2021-06-02 MED ORDER — ARFORMOTEROL TARTRATE 15 MCG/2ML IN NEBU: 15.0000 ug | INHALATION_SOLUTION | Freq: Two times a day (BID) | RESPIRATORY_TRACT | Status: DC

## 2021-06-02 MED ORDER — CHLORHEXIDINE GLUCONATE 0.12 % MT SOLN: 15.0000 mL | Freq: Two times a day (BID) | OROMUCOSAL | Status: DC

## 2021-06-02 MED ORDER — DOXYCYCLINE HYCLATE 100 MG PO TABS: 100.0000 mg | ORAL_TABLET | Freq: Two times a day (BID) | ORAL | Status: DC

## 2021-06-02 MED ORDER — FLUTICASONE FUROATE-VILANTEROL 200-25 MCG/INH IN AEPB
1.0000 | INHALATION_SPRAY | Freq: Every day | RESPIRATORY_TRACT | Status: DC
  Administered 2021-06-02: 1 via RESPIRATORY_TRACT
  Filled 2021-06-02: qty 28

## 2021-06-02 MED ORDER — PANTOPRAZOLE SODIUM 40 MG PO TBEC: 40.0000 mg | DELAYED_RELEASE_TABLET | Freq: Two times a day (BID) | ORAL | Status: DC

## 2021-06-02 MED ORDER — ORAL CARE MOUTH RINSE: 15.0000 mL | Freq: Two times a day (BID) | OROMUCOSAL | Status: DC

## 2021-06-02 MED ORDER — METHYLPREDNISOLONE SODIUM SUCC 125 MG IJ SOLR: 80.0000 mg | Freq: Two times a day (BID) | INTRAMUSCULAR | Status: DC

## 2021-06-02 NOTE — Progress Notes (Signed)
Family back with home oxygen tank. Patient taken to car via wheelchair.

## 2021-06-02 NOTE — Discharge Summary (Signed)
[place] Discharge Summary  RUSHDA RITZ [id number] DOB: [birth year] DOA: 06/01/2021  PCP: Jaynee Eagles, MD  Admit date: 06/01/2021 Discharge date: 06/02/2021  Time spent: 25 minutes  Recommendations for Outpatient Follow-up:  Patient left AGAINST MEDICAL ADVICE  Discharge Diagnoses:  Active Problems:   Tobacco abuse   Constipation   Anxiety disorder   Hypothyroidism   GERD (gastroesophageal reflux disease)   COPD with acute exacerbation (HCC)   Acute on chronic respiratory failure with hypoxia and hypercapnia [facility])   Discharge Condition: Patient left AGAINST MEDICAL ADVICE.  Filed Weights   06/01/21 1915 06/02/21 0030 06/02/21 0425  Weight: 53.8 kg 51.2 kg 52.6 kg    History of present illness:  As per H&P written by Dr. Clearence Ped on 06/01/21 Gloria Calderon  is a 55 y.o. female, with history of asthma, constipation, COPD, depression, anxiety, fibromyalgia, GERD, hypothyroidism, and more presents the ED with a chief complaint of dyspnea.  Started 3 days ago and has been worsening since it started.  Patient does wear 3 L nasal cannula at home.  She reports that she has not felt like she is able to breathe with her baseline oxygen recently.  She has had associated palpitations and chest pain.  She also reports that she has anxiety attacks for which she takes Xanax 3 times a day, and associates her chest pain and palpitations with these anxiety attacks.  Patient is taking breathing treatments without any relief.  She used a DuoNeb 1 time prior to coming into the ED.  Patient reports that she does not remember the trip over to the hospital.  She does not think she passed out but she is confused about what happened on the way here.  This is likely because her PCO2 is 103.  Patient has had a productive cough with dark brown sputum.  This is a change in color from her normal sputum which is clear.  Patient reports that she has rib pain from coughing, and reports that she has  had a history of a cracked rib from coughing.  She denies any fever.  Patient is very upset that her family is continuing to smoke around her.  She lives with her mother and her mother's boyfriend, and they both are smokers.  Patient thinks that this means that they do not care about her.  The flip side of that is patient is also a smoker.  She is trying to quit.  Her only other possible trigger of COPD exacerbation aside from smoking is a new exposure to a new pet cat.  She has not had a history of allergies to cats in the past.  Patient has no other complaints at this time.   Patient is a current smoker, does not want a nicotine patch.  She does not drink alcohol, she does not use illicit drugs, she is vaccinated for COVID.  Patient is full code.   In the ED Temp 98.5, heart rate 82-1 06, respiratory rate 13-25, blood pressure 118/74, satting mid 90s on 4 L nasal cannula EKG shows a heart rate of 97, sinus rhythm, QTC 431, no acute ST changes Respiratory panel negative No leukocytosis, chemistry panel mostly unremarkable Chest x-ray shows bilateral pleural effusion and bronchitic changes Patient was given 125 Solu-Medrol and a DuoNeb in route to the ED Patient got 2 more DuoNeb's in the ED and then an hour-long burst of albuterol Admission for COPD exacerbation  Hospital Course as per program note dictated on 06/02/2021 1-acute on  chronic resp failure with hypoxia -patient normal uses 3L [place] at baseline -patient presented requiring BIPAP and currently requiring 4-5 L to [place] adequate O2 sat -continue nebulizer management using brovana and pulmicort -continue steroids, flutter valve and the use of mucolytic/antitussive medication. -Still demonstrating difficulty speaking in full sentences and with diffuse expiratory wheezing.   2-Tobacco abuse -Cessation counseling provided -Continue nicotine patch   3-gastroesophageal reflux disease -Continue PPI -Dose adjusted to twice a day for  better protection while using steroids.   4-chest pain/chest tightness -Non cardiac in nature -Negative troponin -No EKG or telemetry ischemic changes appreciated.   5-hypothyroidism -Continue Synthroid   6-chronic pain syndrome -Continue home analgesic regimen   7-anxiety disorder -Continue the use of Xanax.  Procedures: See below for x-ray reports.  Consultations: None  Discharge Exam: Vitals:   06/02/21 0758 06/02/21 0800  BP:  (!) 193/165  Pulse:  (!) 105  Resp:  (!) 24  Temp:    SpO2: 98% 98%    General: Despite discussing with patient necessity for continued treatment of her COPD exacerbation and acute on chronic respiratory failure decided to leave [place].  Patient demonstrated competency and was oriented x3 during evaluation.  Discharge Instructions    Allergies as of 06/02/2021       Reactions   Isosorbide Nausea And Vomiting, Swelling   Severe headache   Levaquin [levofloxacin] Hives   Morphine And Related Itching, Nausea And Vomiting        Medication List     ASK your doctor about these medications    acetaminophen 650 MG CR tablet Commonly known as: TYLENOL Take 1,300 mg by mouth 2 (two) times daily.   albuterol 108 (90 Base) MCG/ACT inhaler Commonly known as: VENTOLIN HFA 2 PUFFS EVERY 6 HOURS AS NEEDED FOR WHEEZING OR SHORTNESS OF BREATH   ALPRAZolam 1 MG tablet Commonly known as: XANAX Take 1 tablet (1 mg total) by mouth 4 (four) times daily as needed for anxiety.   Aspirin-Acetaminophen-Caffeine 520-260-32.5 MG Pack Take by mouth.   BC HEADACHE POWDER PO Take 1 Package by mouth daily.   bisacodyl 5 MG EC tablet Commonly known as: DULCOLAX Take 5 mg by mouth daily as needed for moderate constipation.   Breztri Aerosphere 160-9-4.8 MCG/ACT Aero Generic drug: Budeson-Glycopyrrol-Formoterol Inhale 2 puffs into the lungs in the morning and at bedtime.   Carboxymethylcellulose Sod PF 0.5 % Soln Place 1 drop  into both eyes 3 (three) times daily.   celecoxib 200 MG capsule Commonly known as: CELEBREX Take 200 mg by mouth 2 (two) times daily.   COLLAGEN PO Take 1 tablet by mouth daily at 6 (six) AM.   [employer] ADV PROBIOTIC GUMMIES PO Take 1 tablet by mouth daily.   cyclobenzaprine 10 MG tablet Commonly known as: FLEXERIL Take 30 mg by mouth 3 (three) times daily.   fluticasone 50 MCG/ACT nasal spray Commonly known as: FLONASE Place 2 sprays into both nostrils daily.   guaifenesin 100 MG/5ML syrup Commonly known as: ROBITUSSIN Take 200 mg by mouth at bedtime as needed for cough.   HAIR SKIN NAILS PO Take 1 tablet by mouth daily.   ipratropium-albuterol 0.5-2.5 (3) MG/3ML Soln Commonly known as: DUONEB NEBULIZE 1 VIAL EVERY 6 HOURS AS NEEDED FOR SHORTNESS OF BREATH   levothyroxine 25 MCG tablet Commonly known as: SYNTHROID Take 25 mcg by mouth daily before breakfast.   oxyCODONE 15 MG immediate release tablet Commonly known as: ROXICODONE Take 15 mg by mouth in  the morning, at noon, and at bedtime.   OXYGEN Inhale 2.5-3 L into the lungs continuous.   pantoprazole 40 MG tablet Commonly known as: PROTONIX Take 40 mg by mouth daily.   pregabalin 150 MG capsule Commonly known as: LYRICA Take 300 mg by mouth daily.   psyllium 58.6 % powder Commonly known as: METAMUCIL Take 1 packet by mouth daily.   Spacer/Aero-Holding [employer] Use daily with inhaler   Vitamin D 125 MCG (5000 UT) Caps Take 1,500 Units by mouth daily.       Allergies  Allergen Reactions   Isosorbide Nausea And Vomiting and Swelling    Severe headache   Levaquin [Levofloxacin] Hives   Morphine And Related Itching and Nausea And Vomiting      The results of significant diagnostics from this hospitalization (including imaging, microbiology, ancillary and laboratory) are listed below for reference.    Significant Diagnostic Studies: DG Chest Portable 1 View  Result Date:  06/02/2021 CLINICAL DATA:  Chest pain EXAM: PORTABLE CHEST 1 VIEW COMPARISON:  Yesterday FINDINGS: Hyperinflation with apical lucency and emphysematous markings. Healing/healed lateral left rib fractures. There is no edema, consolidation, effusion, or pneumothorax. Normal heart size and mediastinal contours. IMPRESSION: Stable exam.  COPD without acute superimposed finding. Electronically Signed   By: Jorje Guild M.D.   On: 06/02/2021 05:54   DG Chest Port 1 View  Result Date: 06/01/2021 CLINICAL DATA:  Shortness of breath. Hx of asthma, COPD, pneumonia, and current smoker. Being tested for covid-19. EXAM: PORTABLE CHEST 1 VIEW COMPARISON:  Chest x-ray 10/26/2019 FINDINGS: The heart and mediastinal contours are within normal limits. No focal consolidation. Cystic changes more prominent in the upper lung zone. Coarsened interstitial markings with no overt pulmonary edema. Blunting of bilateral costophrenic angles with trace pleural effusions not excluded. No pneumothorax. No acute osseous abnormality. IMPRESSION: 1. Bilateral trace pleural effusions not excluded. 2. Bronchitic changes. 3. Aortic Atherosclerosis (ICD10-I70.0) and Emphysema (ICD10-J43.9). Electronically Signed   By: Iven Finn M.D.   On: 06/01/2021 20:00    Microbiology: Recent Results (from the past 240 hour(s))  Resp Panel by RT-PCR (Flu A&B, Covid) Nasopharyngeal Swab     Status: None   Collection Time: 06/01/21  7:22 PM   Specimen: Nasopharyngeal Swab; Nasopharyngeal(NP) swabs in vial transport medium  Result Value Ref Range Status   SARS Coronavirus 2 by RT PCR NEGATIVE NEGATIVE Final    Comment: (NOTE) SARS-CoV-2 target nucleic acids are NOT DETECTED.  The SARS-CoV-2 RNA is generally detectable in upper respiratory specimens during the acute phase of infection. The lowest concentration of SARS-CoV-2 viral copies this assay can detect is 138 copies/mL. A negative result does not preclude SARS-Cov-2 infection and should  not be used as the sole basis for treatment or other patient management decisions. A negative result may occur with  improper specimen collection/handling, submission of specimen other than nasopharyngeal swab, presence of viral mutation(s) within the areas targeted by this assay, and inadequate number of viral copies(<138 copies/mL). A negative result must be combined with clinical observations, patient history, and epidemiological information. The expected result is Negative.  Fact Sheet for Patients:  EntrepreneurPulse.com.au  Fact Sheet for Healthcare Providers:  IncredibleEmployment.be  This test is no t yet approved or cleared by the [place] FDA and  has been authorized for detection and/or diagnosis of SARS-CoV-2 by FDA under an Emergency Use Authorization (EUA). This EUA will remain  in effect (meaning this test can be used) for the duration of the  COVID-19 declaration under Section 564(b)(1) of the Act, 21 U.S.C.section 360bbb-3(b)(1), unless the authorization is terminated  or revoked sooner.       Influenza A by PCR NEGATIVE NEGATIVE Final   Influenza B by PCR NEGATIVE NEGATIVE Final    Comment: (NOTE) The Xpert Xpress SARS-CoV-2/FLU/RSV plus assay is intended as an aid in the diagnosis of influenza from Nasopharyngeal swab specimens and should not be used as a sole basis for treatment. Nasal washings and aspirates are unacceptable for Xpert Xpress SARS-CoV-2/FLU/RSV testing.  Fact Sheet for Patients: EntrepreneurPulse.com.au  Fact Sheet for Healthcare Providers: IncredibleEmployment.be  This test is not yet approved or cleared by the [place] FDA and has been authorized for detection and/or diagnosis of SARS-CoV-2 by FDA under an Emergency Use Authorization (EUA). This EUA will remain in effect (meaning this test can be used) for the duration of the COVID-19 declaration under  Section 564(b)(1) of the Act, 21 U.S.C. section 360bbb-3(b)(1), unless the authorization is terminated or revoked.  Performed at [facility], [address] [place], [place] [address]   MRSA Next Gen by PCR, Nasal     Status: None   Collection Time: 06/02/21 12:07 AM   Specimen: Nasal Mucosa; Nasal Swab  Result Value Ref Range Status   MRSA by PCR Next Gen NOT DETECTED NOT DETECTED Final    Comment: (NOTE) The GeneXpert MRSA Assay (FDA approved for NASAL specimens only), is one component of a comprehensive MRSA colonization surveillance program. It is not intended to diagnose MRSA infection nor to guide or monitor treatment for MRSA infections. Test performance is not FDA approved in patients less than 80 years old. Performed at [facility], [address] [place], [place] [address]      Labs: Basic Metabolic Panel: Recent Labs  Lab 06/01/21 2001 06/01/21 2218 06/02/21 0313  NA 141  --  139  K 3.9  --  3.7  CL 91*  --  90*  CO2 41*  --  37*  GLUCOSE 106*  --  151*  BUN 11  --  12  CREATININE 0.54  --  0.55  CALCIUM 8.5*  --  8.7*  MG  --  1.8  --    Liver Function Tests: Recent Labs  Lab 06/01/21 2001 06/02/21 0313  AST 20 21  ALT 29 29  ALKPHOS 65 68  BILITOT 0.7 0.5  PROT 6.5 6.6  ALBUMIN 3.7 3.8   CBC: Recent Labs  Lab 06/01/21 2000 06/02/21 0313  WBC 5.6 5.2  HGB 13.4 13.0  HCT 43.9 42.9  MCV 108.1* 106.5*  PLT 222 221    Signed:  Barton Dubois MD.  [facility] 06/02/2021, 5:42 PM

## 2021-06-02 NOTE — Progress Notes (Signed)
Patient stated she wanted to leave AMA.  Informed the MD and he spoke with patient.  She still deciding to leave AMA.  AMA form signed. Family at bedside explained she would need oxygen prior to leaving the hospital.  Family went back to house to get oxygen.

## 2021-06-02 NOTE — Progress Notes (Signed)
Patient requested to come off BIPAP mask. Tried to encourage patient to keep on but stated she needed a break. Then patient complained of chest pain. RN made aware, ECG performed, cardiac workup started and VBG ordered. Patient back on 4 lpm nasal cannula at this time.

## 2021-06-02 NOTE — Progress Notes (Signed)
Patient woke this morning complaining of chest pain. EKG, chest xray, troponin and repeat venous blood gas ordered for patient. Contacted [facility] coverage and was diverted to elink as [facility] was in emergency situation at the time. Elink MD on board to rule our ACS for patient.

## 2021-06-02 NOTE — Progress Notes (Signed)
[place] Progress Note Patient Name: Gloria Calderon DOB: 07/01/1966 MRN: [id number]   Date of Service  06/02/2021  HPI/Events of Note  Camera evaluation for chest pain. Off of bipap, no chest pain any more As per bed side RN discussion. Stomach hurts. Stable VS. On nasal o2. Sats good. Alert and oriented. Got oxycodone.  EKG: No acute ST T changes. Baseline artifact. CxR pending.   eICU Interventions  - looks like atypical chest pain.  - get once troponin level. If elevated to call back.      Intervention Category Intermediate Interventions: Pain - evaluation and management  Elmer Sow 06/02/2021, 5:28 AM

## 2021-06-02 NOTE — Progress Notes (Signed)
PROGRESS NOTE    Gloria Calderon  [id number] DOB: 1966-08-16 DOA: 06/01/2021 PCP: Jaynee Eagles, MD    Chief Complaint  Patient presents with   Shortness of Breath    Brief admission Narrative:  As per H&P written by Dr. Clearence Ped on 06/01/21 Gloria Calderon  is a 55 y.o. female, with history of asthma, constipation, COPD, depression, anxiety, fibromyalgia, GERD, hypothyroidism, and more presents the ED with a chief complaint of dyspnea.  Started 3 days ago and has been worsening since it started.  Patient does wear 3 L nasal cannula at home.  She reports that she has not felt like she is able to breathe with her baseline oxygen recently.  She has had associated palpitations and chest pain.  She also reports that she has anxiety attacks for which she takes Xanax 3 times a day, and associates her chest pain and palpitations with these anxiety attacks.  Patient is taking breathing treatments without any relief.  She used a DuoNeb 1 time prior to coming into the ED.  Patient reports that she does not remember the trip over to the hospital.  She does not think she passed out but she is confused about what happened on the way here.  This is likely because her PCO2 is 103.  Patient has had a productive cough with dark brown sputum.  This is a change in color from her normal sputum which is clear.  Patient reports that she has rib pain from coughing, and reports that she has had a history of a cracked rib from coughing.  She denies any fever.  Patient is very upset that her family is continuing to smoke around her.  She lives with her mother and her mother's boyfriend, and they both are smokers.  Patient thinks that this means that they do not care about her.  The flip side of that is patient is also a smoker.  She is trying to quit.  Her only other possible trigger of COPD exacerbation aside from smoking is a new exposure to a new pet cat.  She has not had a history of allergies to cats in the  past.  Patient has no other complaints at this time.   Patient is a current smoker, does not want a nicotine patch.  She does not drink alcohol, she does not use illicit drugs, she is vaccinated for COVID.  Patient is full code.   In the ED Temp 98.5, heart rate 82-1 06, respiratory rate 13-25, blood pressure 118/74, satting mid 90s on 4 L nasal cannula EKG shows a heart rate of 97, sinus rhythm, QTC 431, no acute ST changes Respiratory panel negative No leukocytosis, chemistry panel mostly unremarkable Chest x-ray shows bilateral pleural effusion and bronchitic changes Patient was given 125 Solu-Medrol and a DuoNeb in route to the ED Patient got 2 more DuoNeb's in the ED and then an hour-long burst of albuterol Admission for COPD exacerbation   Assessment & Plan: 1-acute on chronic resp failure with hypoxia -patient normal uses 3L [place] at baseline -patient presented requiring BIPAP and currently requiring 4-5 L to [place] adequate O2 sat -continue nebulizer management using brovana and pulmicort -continue steroids, flutter valve and the use of mucolytic/antitussive medication. -Still demonstrating difficulty speaking in full sentences and with diffuse expiratory wheezing.  2-Tobacco abuse -Cessation counseling provided -Continue nicotine patch  3-gastroesophageal reflux disease -Continue PPI -Dose adjusted to twice a day for better protection while using steroids.  4-chest pain/chest tightness -Non  cardiac in nature -Negative troponin -No EKG or telemetry ischemic changes appreciated.  5-hypothyroidism -Continue Synthroid  6-chronic pain syndrome -Continue home analgesic regimen  7-anxiety disorder -Continue the use of Xanax.    DVT prophylaxis: SCD's Code Status: Full Code. Family Communication: no family at bedside.  Patient has updated daughter over the phone.  Disposition:   Status is: Observation  The patient will require care spanning > 2 midnights and  should be moved to inpatient because: IV treatments appropriate due to intensity of illness or inability to take PO  Dispo: The patient is from: Home              Anticipated d/c is to: Home              Patient currently is not medically stable to d/c.   Difficult to place patient No      Consultants:  None   Procedures:  See below for x-ray reports.   Antimicrobials:  Doxycycline    Subjective: Afebrile, no nausea, no vomiting. Overnight complaining of CP; patient also continue demonstrating tachypnea and difficulty speaking in full sentence  Objective: Vitals:   06/02/21 0700 06/02/21 0731 06/02/21 0758 06/02/21 0800  BP: (!) 145/84   (!) 193/165  Pulse: 96   (!) 105  Resp: 16   (!) 24  Temp:      TempSrc:      SpO2: 100% 97% 98% 98%  Weight:      Height:       No intake or output data in the 24 hours ending 06/02/21 0911 Filed Weights   06/01/21 1915 06/02/21 0030 06/02/21 0425  Weight: 53.8 kg 51.2 kg 52.6 kg    Examination:  General exam: having difficulty speaking in full sentences, no CP, no nausea, no vomiting. Currently using 4L  supplementation. Respiratory system:positive rhonchi bilaterally, reporting short winded sensation with exertion, bilateral exp wheezing appreciated.  Cardiovascular system: S1 & S2 heard, RRR. No rubs, no gallops or clicks. No pedal edema. Gastrointestinal system: Abdomen is nondistended, soft and nontender. No organomegaly or masses felt. Normal bowel sounds heard. Central nervous system: Alert and oriented. No focal neurological deficits. Extremities: no cyanosis, no clubbing. Skin: No rashes, no petechiae. Psychiatry: Mood & affect appropriate.     Data Reviewed: I have personally reviewed following labs and imaging studies  CBC: Recent Labs  Lab 06/01/21 2000 06/02/21 0313  WBC 5.6 5.2  HGB 13.4 13.0  HCT 43.9 42.9  MCV 108.1* 106.5*  PLT 222 [id number]    Basic Metabolic Panel: Recent Labs  Lab 06/01/21 2001  06/01/21 2218 06/02/21 0313  NA 141  --  139  K 3.9  --  3.7  CL 91*  --  90*  CO2 41*  --  37*  GLUCOSE 106*  --  151*  BUN 11  --  12  CREATININE 0.54  --  0.55  CALCIUM 8.5*  --  8.7*  MG  --  1.8  --     GFR: Estimated Creatinine Clearance: 66 mL/min (by C-G formula based on SCr of 0.55 mg/dL).  Liver Function Tests: Recent Labs  Lab 06/01/21 2001 06/02/21 0313  AST 20 21  ALT 29 29  ALKPHOS 65 68  BILITOT 0.7 0.5  PROT 6.5 6.6  ALBUMIN 3.7 3.8    CBG: No results for input(s): GLUCAP in the last 168 hours.  Recent Results (from the past 240 hour(s))  Resp Panel by RT-PCR (Flu A&B, Covid) Nasopharyngeal Swab  Status: None   Collection Time: 06/01/21  7:22 PM   Specimen: Nasopharyngeal Swab; Nasopharyngeal(NP) swabs in vial transport medium  Result Value Ref Range Status   SARS Coronavirus 2 by RT PCR NEGATIVE NEGATIVE Final    Comment: (NOTE) SARS-CoV-2 target nucleic acids are NOT DETECTED.  The SARS-CoV-2 RNA is generally detectable in upper respiratory specimens during the acute phase of infection. The lowest concentration of SARS-CoV-2 viral copies this assay can detect is 138 copies/mL. A negative result does not preclude SARS-Cov-2 infection and should not be used as the sole basis for treatment or other patient management decisions. A negative result may occur with  improper specimen collection/handling, submission of specimen other than nasopharyngeal swab, presence of viral mutation(s) within the areas targeted by this assay, and inadequate number of viral copies(<138 copies/mL). A negative result must be combined with clinical observations, patient history, and epidemiological information. The expected result is Negative.  Fact Sheet for Patients:  EntrepreneurPulse.com.au  Fact Sheet for Healthcare Providers:  IncredibleEmployment.be  This test is no t yet approved or cleared by the [place] FDA and   has been authorized for detection and/or diagnosis of SARS-CoV-2 by FDA under an Emergency Use Authorization (EUA). This EUA will remain  in effect (meaning this test can be used) for the duration of the COVID-19 declaration under Section 564(b)(1) of the Act, 21 U.S.C.section 360bbb-3(b)(1), unless the authorization is terminated  or revoked sooner.       Influenza A by PCR NEGATIVE NEGATIVE Final   Influenza B by PCR NEGATIVE NEGATIVE Final    Comment: (NOTE) The Xpert Xpress SARS-CoV-2/FLU/RSV plus assay is intended as an aid in the diagnosis of influenza from Nasopharyngeal swab specimens and should not be used as a sole basis for treatment. Nasal washings and aspirates are unacceptable for Xpert Xpress SARS-CoV-2/FLU/RSV testing.  Fact Sheet for Patients: EntrepreneurPulse.com.au  Fact Sheet for Healthcare Providers: IncredibleEmployment.be  This test is not yet approved or cleared by the [place] FDA and has been authorized for detection and/or diagnosis of SARS-CoV-2 by FDA under an Emergency Use Authorization (EUA). This EUA will remain in effect (meaning this test can be used) for the duration of the COVID-19 declaration under Section 564(b)(1) of the Act, 21 U.S.C. section 360bbb-3(b)(1), unless the authorization is terminated or revoked.  Performed at [facility], [address] [place], [place] [address]   MRSA Next Gen by PCR, Nasal     Status: None   Collection Time: 06/02/21 12:07 AM   Specimen: Nasal Mucosa; Nasal Swab  Result Value Ref Range Status   MRSA by PCR Next Gen NOT DETECTED NOT DETECTED Final    Comment: (NOTE) The GeneXpert MRSA Assay (FDA approved for NASAL specimens only), is one component of a comprehensive MRSA colonization surveillance program. It is not intended to diagnose MRSA infection nor to guide or monitor treatment for MRSA infections. Test performance is not FDA approved in patients less  than 3 years old. Performed at [facility], [address] [place], [place] [address]      Radiology Studies: DG Chest Portable 1 View  Result Date: 06/02/2021 CLINICAL DATA:  Chest pain EXAM: PORTABLE CHEST 1 VIEW COMPARISON:  Yesterday FINDINGS: Hyperinflation with apical lucency and emphysematous markings. Healing/healed lateral left rib fractures. There is no edema, consolidation, effusion, or pneumothorax. Normal heart size and mediastinal contours. IMPRESSION: Stable exam.  COPD without acute superimposed finding. Electronically Signed   By: Jorje Guild M.D.   On: 06/02/2021  05:54   DG Chest Port 1 View  Result Date: 06/01/2021 CLINICAL DATA:  Shortness of breath. Hx of asthma, COPD, pneumonia, and current smoker. Being tested for covid-19. EXAM: PORTABLE CHEST 1 VIEW COMPARISON:  Chest x-ray 10/26/2019 FINDINGS: The heart and mediastinal contours are within normal limits. No focal consolidation. Cystic changes more prominent in the upper lung zone. Coarsened interstitial markings with no overt pulmonary edema. Blunting of bilateral costophrenic angles with trace pleural effusions not excluded. No pneumothorax. No acute osseous abnormality. IMPRESSION: 1. Bilateral trace pleural effusions not excluded. 2. Bronchitic changes. 3. Aortic Atherosclerosis (ICD10-I70.0) and Emphysema (ICD10-J43.9). Electronically Signed   By: Iven Finn M.D.   On: 06/01/2021 20:00     Scheduled Meds:  arformoterol  15 mcg Nebulization BID   budesonide (PULMICORT) nebulizer solution  0.5 mg Nebulization BID   chlorhexidine  15 mL Mouth Rinse BID   doxycycline  100 mg Oral Q12H   heparin  5,000 Units Subcutaneous Q8H   ipratropium-albuterol  3 mL Nebulization Q6H   levothyroxine  25 mcg Oral Q0600   mouth rinse  15 mL Mouth Rinse q12n4p   methylPREDNISolone (SOLU-MEDROL) injection  80 mg Intravenous Q12H   nicotine  21 mg Transdermal Daily   pantoprazole  40 mg Oral BID   pregabalin  300 mg Oral  Daily   Continuous Infusions:   LOS: 0 days    Time spent: 35 minutes.    Barton Dubois, MD [facility]   To contact the attending provider between 7A-7P or the covering provider during after hours 7P-7A, please log into the web site [email or web address] and access using universal [place] password for that web site. If you do not have the password, please call the hospital operator.  06/02/2021, 9:11 AM

## 2021-08-05 ENCOUNTER — Other Ambulatory Visit: Payer: Self-pay | Admitting: Emergency Medicine

## 2021-08-13 ENCOUNTER — Encounter: Payer: Self-pay | Admitting: Emergency Medicine

## 2021-08-13 ENCOUNTER — Ambulatory Visit: Payer: Medicare HMO | Admitting: Emergency Medicine

## 2021-08-13 ENCOUNTER — Other Ambulatory Visit: Payer: Self-pay

## 2021-08-13 VITALS — BP 108/68 | HR 96 | Temp 98.2°F | Ht 64.0 in | Wt 104.0 lb

## 2021-08-13 DIAGNOSIS — Z23 Encounter for immunization: Secondary | ICD-10-CM | POA: Diagnosis not present

## 2021-08-13 DIAGNOSIS — R911 Solitary pulmonary nodule: Secondary | ICD-10-CM

## 2021-08-13 DIAGNOSIS — J431 Panlobular emphysema: Secondary | ICD-10-CM | POA: Diagnosis not present

## 2021-08-13 DIAGNOSIS — J9611 Chronic respiratory failure with hypoxia: Secondary | ICD-10-CM

## 2021-08-13 DIAGNOSIS — Z720 Tobacco use: Secondary | ICD-10-CM | POA: Diagnosis not present

## 2021-08-13 MED ORDER — BREZTRI AEROSPHERE 160-9-4.8 MCG/ACT IN AERO: 2.0000 | INHALATION_SPRAY | Freq: Two times a day (BID) | RESPIRATORY_TRACT | 0 refills | Status: DC

## 2021-08-13 MED ORDER — NICOTINE 14 MG/24HR TD PT24: 14.0000 mg | MEDICATED_PATCH | Freq: Every day | TRANSDERMAL | 0 refills | Status: DC

## 2021-08-13 NOTE — Assessment & Plan Note (Signed)
Continue same oxygen as she has been using it

## 2021-08-13 NOTE — Addendum Note (Signed)
Addended by: Gavin Potters R on: 08/13/2021 03:18 PM   Modules accepted: Orders

## 2021-08-13 NOTE — Progress Notes (Signed)
Subjective:    Patient ID: Gloria Calderon, female    DOB: 1966/06/09, 55 y.o.   MRN: [id number]  COPD She complains of cough, shortness of breath and wheezing. Associated symptoms include postnasal drip. Pertinent negatives include no appetite change, ear pain, fever, headaches, rhinorrhea, sneezing, sore throat or trouble swallowing. Her past medical history is significant for COPD.   ROV 02/13/21 --55 year old woman with history of tobacco use and associated severe COPD, hypoxemic respiratory failure, frequent flaring symptoms consistent with chronic bronchitis. Currently managed on Breztri with a spacer. She was noted to have pulmonary nodules on a CT chest done in [facility] 12/13/20.  The report is available.  Note was made of moderately severe centrilobular emphysema, 3.5 mm anterior right lower lobe pulmonary nodule. She has cut her cigarettes down to less than 5 a day.  She is on breztri, seems to be benefiting. She has been more SOB lately due to the severe heat. She uses albuterol nebs as needed. Having a lot of cough with clear  / yellow mucous.   ROV 08/13/21 --this follow-up visit 55 year old woman with history of tobacco abuse (50 pack years), continues to smoke approximately.  She has associated severe COPD with hypoxemic respiratory failure.  She has frequent chronic bronchitic symptoms.  Her last acute exacerbation was 05/2021 for which she was hospitalized. Currently managed on breztri. Uses nebs most mornings and maybe 1-2x a day depending on her activity level. She has cut her cigarettes down - had stopped for a few weeks, now smoking less than 10 a day. She feels better when she smokes less. Less cough, breathing a bit better. She has lost wt over the last several years - about 30 lbs. She is eating, still gets hungry. Stable cough.  She had a 3.5 mm anterior right lower lobe pulmonary nodule noted on a CT chest 12/13/2020, planned annual recheck 11/2021 at [facility]. O2 on 3.5-5L/min.       Review of Systems  Constitutional:  Negative for appetite change, fever and unexpected weight change.  HENT:  Positive for congestion and postnasal drip. Negative for dental problem, ear pain, nosebleeds, rhinorrhea, sinus pressure, sneezing, sore throat and trouble swallowing.   Eyes:  Negative for redness and itching.  Respiratory:  Positive for cough, chest tightness, shortness of breath and wheezing.   Cardiovascular:  Negative for palpitations and leg swelling.  Gastrointestinal:  Negative for abdominal pain, nausea and vomiting.  Genitourinary:  Negative for dysuria.  Musculoskeletal:  Negative for joint swelling.  Skin:  Negative for rash.  Neurological:  Negative for headaches.  Hematological:  Does not bruise/bleed easily.  Psychiatric/Behavioral:  Negative for dysphoric mood. The patient is not nervous/anxious.       Objective:   Physical Exam Vitals:   08/13/21 1440  BP: 108/68  Pulse: 96  Temp: 98.2 F (36.8 C)  TempSrc: Oral  SpO2: 97%  Weight: 104 lb (47.2 kg)  Height: 5\' 4"  (1.626 m)   Gen: Pleasant, well-nourished, in no distress, ill appearing  ENT: No lesions,  mouth clear,  oropharynx clear, nasal congestion, strong voice  Neck: No JVD, no stridor  Lungs: No use of accessory muscles, B exp wheezes  Cardiovascular: RRR, heart sounds normal, no murmur or gallops, no peripheral edema  Musculoskeletal: No deformities, no cyanosis or clubbing  Neuro: alert, non focal  Skin: Warm, no lesions or rashes      Assessment & Plan:  COPD (chronic obstructive pulmonary disease) ([place]) Please continue your [employer]  2 puffs twice a day.  Rinse and gargle after using. Continue your nebulizer treatments up to every 4 hours as you need them.  We will arrange for a mask delivery system Continue your oxygen as you have been using it. Flu shot today  Chronic respiratory failure with hypoxia (HCC) Continue same oxygen as she has been using it  Pulmonary  nodule Small 3.5 mm pulmonary nodule from March 2022.  Needs an annual follow-up scan in March 2023.  I will follow up with her after to review.  Tobacco abuse I am glad that you want to quit smoking!! Call 1-800-QUIT-NOW for more support.  Will write a prescription for nicotine patches 14 mg.  Baltazar Apo, MD, PhD 08/13/2021, 2:56 PM [facility] [phone] or if no answer [phone]

## 2021-08-13 NOTE — Assessment & Plan Note (Signed)
Please continue your Breztri 2 puffs twice a day.  Rinse and gargle after using. Continue your nebulizer treatments up to every 4 hours as you need them.  We will arrange for a mask delivery system Continue your oxygen as you have been using it. Flu shot today

## 2021-08-13 NOTE — Patient Instructions (Addendum)
I am glad that you want to quit smoking!! Call 1-800-QUIT-NOW for more support.  Will write a prescription for nicotine patches 14 mg. Please continue your Breztri 2 puffs twice a day.  Rinse and gargle after using. Continue your nebulizer treatments up to every 4 hours as you need them.  We will arrange for a mask delivery system Continue your oxygen as you have been using it. Flu shot today We will plan to repeat your CT scan of the chest without contrast in March 2023 to follow a small pulmonary nodule. Follow Dr. Lamonte Sakai in March after your CT or sooner if you have any problems.

## 2021-08-13 NOTE — Assessment & Plan Note (Signed)
Small 3.5 mm pulmonary nodule from March 2022.  Needs an annual follow-up scan in March 2023.  I will follow up with her after to review.

## 2021-08-13 NOTE — Assessment & Plan Note (Signed)
I am glad that you want to quit smoking!! Call 1-800-QUIT-NOW for more support.  Will write a prescription for nicotine patches 14 mg.

## 2021-09-11 ENCOUNTER — Telehealth: Payer: Self-pay | Admitting: Emergency Medicine

## 2021-09-11 ENCOUNTER — Other Ambulatory Visit: Payer: Self-pay | Admitting: Emergency Medicine

## 2021-09-11 NOTE — Telephone Encounter (Signed)
Called and spoke with patient who states that she has not heard from [facility] in regards to her patient assistance for [place]. I called the number [phone] and there was an automatic message that said if you are calling for a renewal application for 6553 they are currently being mailed out. I called the patient back to let her know of the message and provided her with the 1-800 number. Nothing further needed at this time.

## 2021-10-07 ENCOUNTER — Other Ambulatory Visit: Payer: Self-pay | Admitting: Emergency Medicine

## 2021-10-07 MED ORDER — BREZTRI AEROSPHERE 160-9-4.8 MCG/ACT IN AERO: 2.0000 | INHALATION_SPRAY | Freq: Two times a day (BID) | RESPIRATORY_TRACT | 3 refills | Status: DC

## 2021-10-07 NOTE — Telephone Encounter (Signed)
[name] is not with [employer], it is with [employer].  Phone number for [employer] is  [phone].  Called and spoke with pt who states that she called [employer] about her [name] and was able to do the re-enrollment over the phone. Stated that all they needed from [place] was an Rx. Stated to pt that we would get this faxed to [employer] for her and she verbalized understanding. Nothing further needed.

## 2021-10-07 NOTE — Addendum Note (Signed)
Addended by: Lorretta Harp on: 10/07/2021 03:38 PM   Modules accepted: Orders

## 2021-10-08 NOTE — Telephone Encounter (Signed)
Rx request completed

## 2021-10-11 ENCOUNTER — Other Ambulatory Visit: Payer: Self-pay

## 2021-10-11 MED ORDER — BREZTRI AEROSPHERE 160-9-4.8 MCG/ACT IN AERO: 2.0000 | INHALATION_SPRAY | Freq: Two times a day (BID) | RESPIRATORY_TRACT | 11 refills | Status: DC

## 2021-10-11 NOTE — Telephone Encounter (Signed)
Pt assistance paperwork was signed along with Rx. Paper work and Rx faxed to [employer]. Nothing further needed at this time.

## 2021-10-31 ENCOUNTER — Telehealth: Payer: Self-pay | Admitting: Emergency Medicine

## 2021-10-31 MED ORDER — PREDNISONE 10 MG PO TABS: 10.0000 mg | ORAL_TABLET | Freq: Every day | ORAL | 0 refills | Status: DC

## 2021-10-31 MED ORDER — AZITHROMYCIN 250 MG PO TABS: ORAL_TABLET | ORAL | 0 refills | Status: DC

## 2021-10-31 NOTE — Telephone Encounter (Signed)
Called and spoke to pt. Pt c/o increase in ShOB, prod cough with clear mucus/faint yellow, wheezing, chest tightness x 3 - 4 days. Pt denies f/c/s. Pt taking OTC cough syrup and mucinex. Pt last seen in 07/2021 for emphysema and lung nodule, seen by RB. Pt states she has an at home test for covid and will use this and call back if it is positive. Pt does not want an appt as she has an upcoming appt on 3/8 with RB to review CT results (super d CT scheduled for 3/7).    Dr. Lamonte Sakai, please advise. Thanks.

## 2021-10-31 NOTE — Telephone Encounter (Signed)
Please have her take prednisone > Take 40mg  daily for 3 days, then 30mg  daily for 3 days, then 20mg  daily for 3 days, then 10mg  daily for 3 days, then stop Have her take azithromycin > Z-Pak

## 2021-10-31 NOTE — Telephone Encounter (Signed)
Called and spoke to pt. Pt states her covid test was negative. Informed her of the recs per RB. Rx's sent to preferred pharmacy. Pt verbalized understanding and denied any further questions or concerns at this time.

## 2021-11-19 ENCOUNTER — Other Ambulatory Visit: Payer: Self-pay

## 2021-11-19 ENCOUNTER — Ambulatory Visit (HOSPITAL_COMMUNITY)
Admission: RE | Admit: 2021-11-19 | Discharge: 2021-11-19 | Disposition: A | Discharge status: Home / Self Care | Payer: Medicare HMO | Source: Ambulatory Visit | Attending: Emergency Medicine | Admitting: Emergency Medicine

## 2021-11-19 DIAGNOSIS — R911 Solitary pulmonary nodule: Secondary | ICD-10-CM | POA: Diagnosis not present

## 2021-11-20 ENCOUNTER — Ambulatory Visit: Payer: Medicare HMO | Admitting: Emergency Medicine

## 2021-11-20 ENCOUNTER — Encounter: Payer: Self-pay | Admitting: Emergency Medicine

## 2021-11-20 DIAGNOSIS — R911 Solitary pulmonary nodule: Secondary | ICD-10-CM

## 2021-11-20 DIAGNOSIS — J431 Panlobular emphysema: Secondary | ICD-10-CM

## 2021-11-20 DIAGNOSIS — J9611 Chronic respiratory failure with hypoxia: Secondary | ICD-10-CM | POA: Diagnosis not present

## 2021-11-20 MED ORDER — DOXYCYCLINE HYCLATE 100 MG PO TABS: 100.0000 mg | ORAL_TABLET | Freq: Two times a day (BID) | ORAL | 0 refills | Status: DC

## 2021-11-20 NOTE — Patient Instructions (Addendum)
We reviewed your CT scan of the chest today. ?Please take doxycycline 100 mg twice a day for 7 days ?We will plan to repeat your CT in early May 2023 ?Continue Breztri 2 puffs twice a day.  Rinse and gargle after using. ?Keep your albuterol available to use either 2 puffs or 1 nebulizer treatment up to every 4 hours if needed for shortness of breath, chest tightness, wheezing. ?Continue Mucinex ?We will get you a flutter valve.  Use this several times a day to help you clear mucus. ?Continue your oxygen as you have been using it ?Continue to work on decreasing your cigarettes ?Follow with Dr Lamonte Sakai in May after your CT chest so we can review the results together. ?

## 2021-11-20 NOTE — Addendum Note (Signed)
Addended by: Gavin Potters R on: 11/20/2021 04:53 PM ? ? Modules accepted: Orders ? ?

## 2021-11-20 NOTE — Assessment & Plan Note (Signed)
The small 3.5 millimeter nodule is not seen on this scan but she does have 2 areas of parenchymal thickening versus nodularity in the bilateral upper lobes.  Question whether this could represent residual pneumonia.  I will treat her with doxycycline and plan to repeat her CT in 2 months.  If these areas persist then they may need other work-up including PET scan, navigational bronchoscopy. ?

## 2021-11-20 NOTE — Progress Notes (Signed)
?Subjective:  ? ? Patient ID: Gloria Calderon, female    DOB: May 27, 1966, 56 y.o.   MRN: [id number] ? ?COPD ?She complains of cough, shortness of breath and wheezing. Associated symptoms include postnasal drip. Pertinent negatives include no appetite change, ear pain, fever, headaches, rhinorrhea, sneezing, sore throat or trouble swallowing. Her past medical history is significant for COPD.  ? ? ?ROV 08/13/21 --this follow-up visit 56 year old woman with history of tobacco abuse (50 pack years), continues to smoke approximately.  She has associated severe COPD with hypoxemic respiratory failure.  She has frequent chronic bronchitic symptoms.  Her last acute exacerbation was 05/2021 for which she was hospitalized. Currently managed on breztri. Uses nebs most mornings and maybe 1-2x a day depending on her activity level. She has cut her cigarettes down - had stopped for a few weeks, now smoking less than 10 a day. She feels better when she smokes less. Less cough, breathing a bit better. She has lost wt over the last several years - about 30 lbs. She is eating, still gets hungry. Stable cough.  ?She had a 3.5 mm anterior right lower lobe pulmonary nodule noted on a CT chest 12/13/2020, planned annual recheck 11/2021 at [facility]. ?O2 on 3.5-5L/min.  ? ?ROV 11/20/21 --56 year old woman with a history of active tobacco use (100 pack years), smoking three-quarter pack a day.  She has been managed on Breztri.  Uses albuterol approximately 4-5x a day. Nebs 3x a day.  ?She is having a lot of congestion, is having more exertional SOB. She is coughing up brown/gray mucous. No hemoptysis. She was treated for an acute flare 2/16.  ?She had a 3.5 mm anterior right lower lobe pulmonary nodule on CT chest 11/2020 from OSH, repeat scan as below ? ?CT scan of the chest 11/19/2021 reviewed by me, shows an area of elongated masslike parenchymal thickening left upper lobe 5 cm in largest dimension surrounded by extensive centrilobular  emphysema bilaterally.  There is a 14 mm irregular right upper lobe nodule. ? ? ?Review of Systems  ?Constitutional:  Negative for appetite change, fever and unexpected weight change.  ?HENT:  Positive for congestion and postnasal drip. Negative for dental problem, ear pain, nosebleeds, rhinorrhea, sinus pressure, sneezing, sore throat and trouble swallowing.   ?Eyes:  Negative for redness and itching.  ?Respiratory:  Positive for cough, chest tightness, shortness of breath and wheezing.   ?Cardiovascular:  Negative for palpitations and leg swelling.  ?Gastrointestinal:  Negative for abdominal pain, nausea and vomiting.  ?Genitourinary:  Negative for dysuria.  ?Musculoskeletal:  Negative for joint swelling.  ?Skin:  Negative for rash.  ?Neurological:  Negative for headaches.  ?Hematological:  Does not bruise/bleed easily.  ?Psychiatric/Behavioral:  Negative for dysphoric mood. The patient is not nervous/anxious.   ? ?   ?Objective:  ? Physical Exam ?Vitals:  ? 11/20/21 1543  ?BP: 124/70  ?Pulse: (!) 114  ?Temp: 98.6 ?F (37 ?C)  ?TempSrc: Oral  ?SpO2: 95%  ?Weight: 117 lb 9.6 oz (53.3 kg)  ?Height: '5\' 4"'$  (1.626 m)  ? ?Gen: Pleasant, well-nourished, in no distress, ill appearing ? ?ENT: No lesions,  mouth clear,  oropharynx clear, nasal congestion, strong voice ? ?Neck: No JVD, no stridor ? ?Lungs: No use of accessory muscles, B exp wheezes ? ?Cardiovascular: RRR, heart sounds normal, no murmur or gallops, no peripheral edema ? ?Musculoskeletal: No deformities, no cyanosis or clubbing ? ?Neuro: alert, non focal ? ?Skin: Warm, no lesions or rashes ? ? ?   ?  Assessment & Plan:  ?COPD (chronic obstructive pulmonary disease) ([place]) ?Please take doxycycline 100 mg twice a day for 7 days ?Continue Breztri 2 puffs twice a day.  Rinse and gargle after using. ?Keep your albuterol available to use either 2 puffs or 1 nebulizer treatment up to every 4 hours if needed for shortness of breath, chest tightness, wheezing. ?Continue  Mucinex ?We will get you a flutter valve.  Use this several times a day to help you clear mucus. ?Follow with Dr Lamonte Sakai in May after your CT chest so we can review the results together. ? ?Chronic respiratory failure with hypoxia ([place]) ?Continue oxygen 3-5 L/min ? ?Pulmonary nodule ?The small 3.5 millimeter nodule is not seen on this scan but she does have 2 areas of parenchymal thickening versus nodularity in the bilateral upper lobes.  Question whether this could represent residual pneumonia.  I will treat her with doxycycline and plan to repeat her CT in 2 months.  If these areas persist then they may need other work-up including PET scan, navigational bronchoscopy. ? ?Baltazar Apo, MD, PhD ?11/20/2021, 4:14 PM ?[facility] ?[phone] or if no answer [phone] ? ?

## 2021-11-20 NOTE — Assessment & Plan Note (Signed)
Please take doxycycline 100 mg twice a day for 7 days ?Continue Breztri 2 puffs twice a day.  Rinse and gargle after using. ?Keep your albuterol available to use either 2 puffs or 1 nebulizer treatment up to every 4 hours if needed for shortness of breath, chest tightness, wheezing. ?Continue Mucinex ?We will get you a flutter valve.  Use this several times a day to help you clear mucus. ?Follow with Dr Lamonte Sakai in May after your CT chest so we can review the results together. ?

## 2021-11-20 NOTE — Assessment & Plan Note (Signed)
Continue oxygen 3-5 L/min ?

## 2021-12-05 ENCOUNTER — Telehealth: Payer: Self-pay | Admitting: Emergency Medicine

## 2021-12-05 DIAGNOSIS — J431 Panlobular emphysema: Secondary | ICD-10-CM

## 2021-12-05 DIAGNOSIS — J439 Emphysema, unspecified: Secondary | ICD-10-CM

## 2021-12-06 NOTE — Telephone Encounter (Signed)
ATC patient. LVM for patient to call us back. Orders have been put in for nebulizer mask and spacer. Nothing further needed.  ?

## 2022-01-20 ENCOUNTER — Telehealth: Payer: Self-pay | Admitting: Emergency Medicine

## 2022-01-20 DIAGNOSIS — R911 Solitary pulmonary nodule: Secondary | ICD-10-CM

## 2022-01-21 NOTE — Telephone Encounter (Signed)
I called and spoke with the pt  ?She is calling about her ct scan for May 2023  ?It was never ordered yet  ?I placed the order based on last AVS  ?Nothing further needed ?

## 2022-02-07 ENCOUNTER — Ambulatory Visit (HOSPITAL_COMMUNITY)
Admission: RE | Admit: 2022-02-07 | Discharge: 2022-02-07 | Disposition: A | Discharge status: Home / Self Care | Payer: Medicare PPO | Source: Ambulatory Visit | Attending: Emergency Medicine | Admitting: Emergency Medicine

## 2022-02-07 DIAGNOSIS — R911 Solitary pulmonary nodule: Secondary | ICD-10-CM | POA: Insufficient documentation

## 2022-02-11 ENCOUNTER — Encounter: Payer: Self-pay | Admitting: Primary Care

## 2022-02-11 ENCOUNTER — Other Ambulatory Visit (HOSPITAL_COMMUNITY): Payer: Self-pay

## 2022-02-11 ENCOUNTER — Telehealth: Payer: Self-pay | Admitting: Primary Care

## 2022-02-11 ENCOUNTER — Telehealth (INDEPENDENT_AMBULATORY_CARE_PROVIDER_SITE_OTHER): Payer: Medicare PPO | Admitting: Primary Care

## 2022-02-11 ENCOUNTER — Telehealth: Payer: Self-pay

## 2022-02-11 DIAGNOSIS — J431 Panlobular emphysema: Secondary | ICD-10-CM | POA: Diagnosis not present

## 2022-02-11 DIAGNOSIS — Z720 Tobacco use: Secondary | ICD-10-CM

## 2022-02-11 DIAGNOSIS — R911 Solitary pulmonary nodule: Secondary | ICD-10-CM

## 2022-02-11 MED ORDER — PREDNISONE 10 MG PO TABS: ORAL_TABLET | ORAL | 0 refills | Status: DC

## 2022-02-11 MED ORDER — VARENICLINE TARTRATE 1 MG PO TABS: 1.0000 mg | ORAL_TABLET | Freq: Two times a day (BID) | ORAL | 1 refills | Status: DC

## 2022-02-11 MED ORDER — VARENICLINE TARTRATE 0.5 MG X 11 & 1 MG X 42 PO TBPK: ORAL_TABLET | ORAL | 0 refills | Status: DC

## 2022-02-11 MED ORDER — AMOXICILLIN-POT CLAVULANATE 875-125 MG PO TABS: 1.0000 | ORAL_TABLET | Freq: Two times a day (BID) | ORAL | 0 refills | Status: DC

## 2022-02-11 NOTE — Telephone Encounter (Signed)
Spoke with [employer] about ordering a neb mask without holes in it. Representative will look up the name of the mask and call office when name is obtained.

## 2022-02-11 NOTE — Telephone Encounter (Addendum)
Smoking cessation pilot project is no longer active at [employer]. Called patient and referred to [facility] Quitline. She states she is currently taking nicotine patch and is specifically interested in restarting Chantix  Initiated varenicline titration of 0.5 mg by mouth once daily with food x3 days, then 0.5 mg by mouth twice daily with food x4 days, then 1 mg by mouth twice daily with food thereafter.  CrCL greater than 30 mL/min. Patient counseled on purpose, proper use, and potential adverse effects, including GI upset, and potential change in mood.  She reports no GI side effects, abnormal dreams or nightmares. We reviewed resason for FDA recall. She verbalized understanding and would like to move forward with varenicline.  She would like to quit by July 5th, when her father's birthday is. She is currently at 10 cigarettes or less daily.  Rx sent for varenicline: take 0.71m once daily x 3 days, then 0.'5mg'$  twice dialy x 4 days, then '1mg'$  twice daily. She will take for 12 weeks of total treatment. She is aware of $47 copay.  Will f/u with patient in 2 weeks.  DKnox Saliva PharmD, MPH, BCPS, CPP Clinical Pharmacist (Rheumatology and Pulmonology)

## 2022-02-11 NOTE — Patient Instructions (Addendum)
-   Continue Mucinex twice daily - Sending in abx for bronchitis symptoms  - We have asked DME company provide you with facemask without holes, they are in the process of contacting the manufacture to carry this order out  - A back up battery for portable oxygen concentrator would be private pay, please let us know/or contact your DME if you want to proceed with this and we can place an order   Orders: - Super D CT chest in 3 months re: pulmonary nodules  - DME order for nebulizer facemask without holes   Rx: -   Follow-up: - 3-4 months with Dr. Lamonte Sakai

## 2022-02-11 NOTE — Telephone Encounter (Signed)
Please set patient up for smoking cessation with pharmacist

## 2022-02-11 NOTE — Progress Notes (Signed)
Virtual Visit via Video Note  I connected with Gloria Calderon on 02/11/22 at  2:30 PM EDT by a video enabled telemedicine application and verified that I am speaking with the correct person using two identifiers.  Location: Patient: Home Provider: Office    I discussed the limitations of evaluation and management by telemedicine and the availability of in person appointments. The patient expressed understanding and agreed to proceed.  History of Present Illness: 56 year old female, current every day smoker.  Past medical history significant for COPD, chronic respiratory failure with hypoxia, pulmonary nodule, hypothyroidism, GERD, polycythemia, vitamin D deficiency, class III angina, tobacco abuse.  Patient of Dr. Lamonte Sakai, last seen in office on 11/20/2021.  Previous [facility] pulmonary encounter: ROV 08/13/21 --this follow-up visit 56 year old woman with history of tobacco abuse (50 pack years), continues to smoke approximately.  She has associated severe COPD with hypoxemic respiratory failure.  She has frequent chronic bronchitic symptoms.  Her last acute exacerbation was 05/2021 for which she was hospitalized. Currently managed on breztri. Uses nebs most mornings and maybe 1-2x a day depending on her activity level. She has cut her cigarettes down - had stopped for a few weeks, now smoking less than 10 a day. She feels better when she smokes less. Less cough, breathing a bit better. She has lost wt over the last several years - about 30 lbs. She is eating, still gets hungry. Stable cough.  She had a 3.5 mm anterior right lower lobe pulmonary nodule noted on a CT chest 12/13/2020, planned annual recheck 11/[facility]21 at [facility]. O2 on 3.5-5L/min.   ROV 11/20/21 --56 year old woman with a history of active tobacco use (100 pack years), smoking three-quarter pack a day.  She has been managed on Breztri.  Uses albuterol approximately 4-5x a day. Nebs 3x a day.  She is having a lot of congestion, is having more  exertional SOB. She is coughing up brown/gray mucous. No hemoptysis. She was treated for an acute flare 2/16.  She had a 3.5 mm anterior right lower lobe pulmonary nodule on CT chest 11/2020 from OSH, repeat scan as below  CT scan of the chest 11/19/2021 reviewed by me, shows an area of elongated masslike parenchymal thickening left upper lobe 5 cm in largest dimension surrounded by extensive centrilobular emphysema bilaterally.  There is a 14 mm irregular right upper lobe nodule.   02/11/2022- Interim hx  Patient contacted today for virtual video visit to review CT chest results to follow-up on lung nodules.  She had a super D CT chest on 02/07/2022 that showed decrease in size of irregular masslike in bandlike consolidation in left upper lobe since 11/19/2021 potentially indicating slowly evolving postinfectious/inflammatory scarring.  Underlying neoplasm remains difficult to entirely exclude and high risk patient.  Continue to recommend short-term follow-up with repeat CT chest in 3 to 6 months.  Small consolidation in medial right upper lobe remained stable.  Minimal patchy consolidation and groundglass opacity in medial basilar lingula new favoring infectious/inflammatory etiology.  She has severe centrilobular emphysema with mild diffuse bronchial wall thickening suggesting COPD.  Feeling a little better breathing wise since cutting back on smoking. Still having difficult time quitting. Asking about going back on chantix. She had smoking cessation consult with pharmacy back in 2021 prior to chantix being recalled. She is requesting back up battery for POC and nebulizer mask without holes. She is taking mucinex daily Cough is productive with thick cloudy mucus. She has some intermittent wheezing. She is compliant with  Breztri aerospher two puffs twice daily.    IMPRESSION: 1. Irregular masslike and bandlike consolidation in the left upper lobe is slightly decreased in size since 11/19/2021 chest  CT, potentially indicating slowly evolving postinfectious/postinflammatory scarring. Underlying neoplasm remains difficult to entirely exclude in this high risk patient. Suggest follow-up noncontrast chest CT in 3-6 months. 2. Small mildly thickened curvilinear focus of consolidation in the medial right upper lobe is stable since 11/19/2021 chest CT, probably benign postinfectious/postinflammatory scarring. 3. Minimal patchy consolidation and ground-glass opacity in the medial basilar lingula, new, favoring infectious/inflammatory etiology. 4. Severe centrilobular emphysema with mild diffuse bronchial wall thickening, suggesting COPD.    Observations/Objective:  - Appear well; No acute respiratory complaints. Wearing oxygen  Assessment and Plan:  COPD with acute bronchitis:  - Continue Breztri Aeropshere two puffs morning and evening  - Continue Mucinex 600-'1200mg'$  twice daily for congestion  - New minimal patchy consolidation medial basilar lingular - Rx Augmentin 1 tab twice daily x 7 days and prednisone '20mg'$  x 7 days  - DME order for new nebulizer mask  Chronic respiratory failure: - Stable; Continue 3-5L supplemental O2 to maintain oxygen level > 88-90% - Patient inquiring about backup battery for POC, per her DME company this would be self-pay  Pulmonary nodules: - Stable; CT of chest on 02/07/22 showed decrease in size irregular masslike and bandlike consolidation left upper lobes since 11/19/21, currently measuring 3.1cm x 1.9cm. Stable small focus of consolidation RUL 1.4 x 0/4cm.  - Needs repeat Super D CT chest in 3-6 months to monitor d/t high risk   Tobacco abuse: - Actively trying to cut back, interested in resuming Chantix  - Referring to pharmacy for smoking cessation counseling   Follow Up Instructions:   3-4 months with DR. Byum  I discussed the assessment and treatment plan with the patient. The patient was provided an opportunity to ask questions and all  were answered. The patient agreed with the plan and demonstrated an understanding of the instructions.   The patient was advised to call back or seek an in-person evaluation if the symptoms worsen or if the condition fails to improve as anticipated.  I provided 28 minutes of non-face-to-face time during this encounter.   Martyn Ehrich, NP

## 2022-03-04 NOTE — Telephone Encounter (Signed)
Left VM for patient requesting return call for update on smoking cessation goal.  Knox Saliva, PharmD, MPH, BCPS, CPP Clinical Pharmacist (Rheumatology and Pulmonology)

## 2022-03-05 ENCOUNTER — Telehealth: Payer: Self-pay | Admitting: Emergency Medicine

## 2022-03-05 NOTE — Telephone Encounter (Signed)
Results of pt's CT have been sent electronically to pt's PCP. Nothing further needed.

## 2022-03-05 NOTE — Telephone Encounter (Signed)
ATC patient--call was answered then disconnected. Call back and received busy signal.

## 2022-04-15 ENCOUNTER — Other Ambulatory Visit: Payer: Self-pay

## 2022-04-15 MED ORDER — BREZTRI AEROSPHERE 160-9-4.8 MCG/ACT IN AERO: 2.0000 | INHALATION_SPRAY | Freq: Two times a day (BID) | RESPIRATORY_TRACT | 3 refills | Status: DC

## 2022-04-17 ENCOUNTER — Ambulatory Visit: Payer: Medicare PPO | Admitting: Internal Medicine

## 2022-04-17 ENCOUNTER — Telehealth: Payer: Self-pay | Admitting: Emergency Medicine

## 2022-04-17 NOTE — Telephone Encounter (Signed)
Called and spoke with pt and stated to her that she either needed to be seen at office for appt due to O2 sats dropping or she needed to go to either UC or ED to be evaluated. Pt verbalized understanding and did agree to have appt scheduled. Pt scheduled to see Dr. Annamaria Boots at 2pm. Nothing further needed.

## 2022-04-22 ENCOUNTER — Telehealth: Payer: Self-pay | Admitting: Emergency Medicine

## 2022-04-23 NOTE — Telephone Encounter (Signed)
Called and spoke with pt who is needing to know if she needs to have a biopsy of her lung. Pt stated she was in [facility] hospital 8/3-8/4 and had a CT performed while in the hospital. Dr. Lamonte Sakai, please advise on this for pt.

## 2022-04-24 NOTE — Telephone Encounter (Signed)
Please let the patient know that I need to see the CT scan from [facility].  We have been following some lung opacities have actually been getting smaller on serial CTs here in [place].  Unclear to me that we need to pursue biopsy but I need to see the new films and compare them side-by-side.  It would be best if she could obtain a copy of the scan (or help [place] get a copy) and then see me in office so we can review together.

## 2022-04-24 NOTE — Telephone Encounter (Signed)
Called and spoke with pt letting her know recs per Dr. Lamonte Sakai and she verbalized understanding. Pt stated she would call the hospital to request a disc of her CT. Nothing further needed.

## 2022-04-28 NOTE — Telephone Encounter (Signed)
Disc from [facility] received via mail. Disc placed in basket upfont.

## 2022-04-28 NOTE — Telephone Encounter (Signed)
CD placed in Dr. Agustina Caroli B pod box.

## 2022-05-01 NOTE — Telephone Encounter (Signed)
Patient called to make sure we received CD- patient would like Dr. Lamonte Sakai nurse to send her a mychart message with report/ results.  Please advise.

## 2022-05-13 ENCOUNTER — Ambulatory Visit (HOSPITAL_COMMUNITY): Admission: RE | Admit: 2022-05-13 | Payer: Medicare PPO | Source: Ambulatory Visit

## 2022-05-15 NOTE — Telephone Encounter (Signed)
CD given to Dr. Lamonte Sakai to review

## 2022-05-15 NOTE — Telephone Encounter (Signed)
I reviewed her outside CT scan of the chest from 04/17/2022.  Please let her know that this showed that her left upper lobe nodular opacity is slightly smaller than on her previous here in May.  The mid right upper lobe nodule is stable in size.  We should send the disc from the outside hospital to our radiology for over read so they will have it for comparison going forward.  Thank you

## 2022-05-20 NOTE — Telephone Encounter (Signed)
Spoke with pt and reviewed Dr. Agustina Caroli dictation. Pt is scheduled for OV on 05/22/22 to discuss details. Nothing further needed at this time.

## 2022-05-22 ENCOUNTER — Ambulatory Visit: Payer: Medicare PPO | Admitting: Emergency Medicine

## 2022-05-22 ENCOUNTER — Encounter: Payer: Self-pay | Admitting: Emergency Medicine

## 2022-05-22 VITALS — BP 122/68 | HR 77 | Temp 97.8°F | Ht 65.0 in | Wt 109.2 lb

## 2022-05-22 DIAGNOSIS — K219 Gastro-esophageal reflux disease without esophagitis: Secondary | ICD-10-CM | POA: Diagnosis not present

## 2022-05-22 DIAGNOSIS — R911 Solitary pulmonary nodule: Secondary | ICD-10-CM | POA: Diagnosis not present

## 2022-05-22 DIAGNOSIS — J431 Panlobular emphysema: Secondary | ICD-10-CM

## 2022-05-22 DIAGNOSIS — Z720 Tobacco use: Secondary | ICD-10-CM | POA: Diagnosis not present

## 2022-05-22 NOTE — Progress Notes (Signed)
Subjective:    Patient ID: Gloria Calderon, female    DOB: [birth year], 56 y.o.   MRN: [id number]  COPD She complains of cough, shortness of breath and wheezing. Associated symptoms include postnasal drip. Pertinent negatives include no appetite change, ear pain, fever, headaches, rhinorrhea, sneezing, sore throat or trouble swallowing. Her past medical history is significant for COPD.    ROV 11/20/21 --56 year old woman with a history of active tobacco use (100 pack years), smoking three-quarter pack a day.  She has been managed on Breztri.  Uses albuterol approximately 4-5x a day. Nebs 3x a day.  She is having a lot of congestion, is having more exertional SOB. She is coughing up brown/gray mucous. No hemoptysis. She was treated for an acute flare 2/16.  She had a 3.5 mm anterior right lower lobe pulmonary nodule on CT chest 11/2020 from OSH, repeat scan as below  CT scan of the chest 11/19/2021 reviewed by me, shows an area of elongated masslike parenchymal thickening left upper lobe 5 cm in largest dimension surrounded by extensive centrilobular emphysema bilaterally.  There is a 14 mm irregular right upper lobe nodule.   ROV 05/22/2022 --56 year old woman with severe COPD, continued tobacco use (100 pack years), managed on Breztri and uses frequent albuterol.  She has pulmonary nodular disease including a 14 mm irregular right upper lobe pulmonary nodule, some parenchymal thickening in left upper lobe 5 cm in largest dimension with surrounding centrilobular emphysema.  She was apparently admitted to outside hospital for COPD exacerbation and a repeat CT chest was done on 04/17/2022.  I have reviewed.  The left upper lobe nodular opacity is smaller.  The right upper lobe pulmonary nodule is stable in size.  I have sent the films from the outside hospital to be over read by our radiologists. Today she reports that she is feeling better. Smoking 10 cig a day. She has exertional dyspnea.  Currently managed  on breztri, uses albuterol and DuoNeb frequently.  She is smoking approximately Remains on Protonix, fluticasone nasal spray as needed, off Chantix   Review of Systems  Constitutional:  Negative for appetite change, fever and unexpected weight change.  HENT:  Positive for congestion and postnasal drip. Negative for dental problem, ear pain, nosebleeds, rhinorrhea, sinus pressure, sneezing, sore throat and trouble swallowing.   Eyes:  Negative for redness and itching.  Respiratory:  Positive for cough, chest tightness, shortness of breath and wheezing.   Cardiovascular:  Negative for palpitations and leg swelling.  Gastrointestinal:  Negative for abdominal pain, nausea and vomiting.  Genitourinary:  Negative for dysuria.  Musculoskeletal:  Negative for joint swelling.  Skin:  Negative for rash.  Neurological:  Negative for headaches.  Hematological:  Does not bruise/bleed easily.  Psychiatric/Behavioral:  Negative for dysphoric mood. The patient is not nervous/anxious.        Objective:   Physical Exam Vitals:   05/22/22 1503  BP: 122/68  Pulse: 77  Temp: 97.8 F (36.6 C)  TempSrc: Oral  SpO2: 95%  Weight: 109 lb 3.2 oz (49.5 kg)  Height: '5\' 5"'$  (1.651 m)   Gen: Pleasant, well-nourished, in no distress, ill appearing  ENT: No lesions,  mouth clear,  oropharynx clear, nasal congestion, strong voice  Neck: No JVD, no stridor  Lungs: No use of accessory muscles, B exp wheezes  Cardiovascular: RRR, heart sounds normal, no murmur or gallops, no peripheral edema  Musculoskeletal: No deformities, no cyanosis or clubbing  Neuro: alert, non focal  Skin: Warm,  no lesions or rashes      Assessment & Plan:  COPD (chronic obstructive pulmonary disease) ([place]) Please continue Breztri 2 puffs twice a day.  Rinse and gargle after using. Keep your DuoNeb available to use 1 nebulizer treatment up to every 6 hours if needed for shortness of breath, chest tightness, wheezing. Continue  albuterol 2 puffs up to every 4 hours if needed for shortness of breath, chest tightness, wheezing. Get the flu shot this fall Consider getting the updated COVID-19 booster this fall. Follow with Dr Lamonte Sakai in 3 months or sooner if you have any problems  Pulmonary nodule Compared her CT chest from outside hospital to her most recent scans.  Her linear left-sided opacity is stable to slightly smaller.  The 14 mm irregular right upper lobe nodule is stable.  We will plan to repeat her CT in March 2024.  GERD (gastroesophageal reflux disease) Continue Protonix as you have been taking it   Tobacco abuse Continue to work on decreasing your cigarettes. We agreed today that you would try to get down to 7 cigarettes daily by our next visit.  Baltazar Apo, MD, PhD 05/22/2022, 5:22 PM [facility] [phone] or if no answer [phone]

## 2022-05-22 NOTE — Assessment & Plan Note (Signed)
Please continue Breztri 2 puffs twice a day.  Rinse and gargle after using. Keep your DuoNeb available to use 1 nebulizer treatment up to every 6 hours if needed for shortness of breath, chest tightness, wheezing. Continue albuterol 2 puffs up to every 4 hours if needed for shortness of breath, chest tightness, wheezing. Get the flu shot this fall Consider getting the updated COVID-19 booster this fall. Follow with Dr Lamonte Sakai in 3 months or sooner if you have any problems

## 2022-05-22 NOTE — Patient Instructions (Addendum)
Please continue Breztri 2 puffs twice a day.  Rinse and gargle after using. Keep your DuoNeb available to use 1 nebulizer treatment up to every 6 hours if needed for shortness of breath, chest tightness, wheezing. Continue albuterol 2 puffs up to every 4 hours if needed for shortness of breath, chest tightness, wheezing. We will plan to repeat your CT scan of the chest without contrast in March 2024 to follow for stability. Continue to work on decreasing your cigarettes. Continue Protonix as you have been taking it Keep your fluticasone nasal spray available to use 2 sprays each nostril once daily if you need it for congestion, allergies, cough. We agreed today that you would try to get down to 7 cigarettes daily by our next visit. Get the flu shot this fall Consider getting the updated COVID-19 booster this fall. Follow with Dr Lamonte Sakai in 3 months or sooner if you have any problems

## 2022-05-22 NOTE — Assessment & Plan Note (Signed)
Compared her CT chest from outside hospital to her most recent scans.  Her linear left-sided opacity is stable to slightly smaller.  The 14 mm irregular right upper lobe nodule is stable.  We will plan to repeat her CT in March 2024.

## 2022-05-22 NOTE — Assessment & Plan Note (Signed)
Continue Protonix as you have been taking it 

## 2022-05-22 NOTE — Assessment & Plan Note (Signed)
Continue to work on decreasing your cigarettes. We agreed today that you would try to get down to 7 cigarettes daily by our next visit.

## 2022-06-13 ENCOUNTER — Telehealth: Payer: Self-pay | Admitting: Primary Care

## 2022-06-13 ENCOUNTER — Encounter: Payer: Self-pay | Admitting: Primary Care

## 2022-06-13 ENCOUNTER — Ambulatory Visit: Payer: Medicare PPO | Admitting: Primary Care

## 2022-06-13 VITALS — BP 116/72 | HR 81 | Temp 98.3°F | Ht 64.0 in | Wt 108.8 lb

## 2022-06-13 DIAGNOSIS — R911 Solitary pulmonary nodule: Secondary | ICD-10-CM | POA: Diagnosis not present

## 2022-06-13 DIAGNOSIS — J439 Emphysema, unspecified: Secondary | ICD-10-CM | POA: Diagnosis not present

## 2022-06-13 DIAGNOSIS — J9611 Chronic respiratory failure with hypoxia: Secondary | ICD-10-CM

## 2022-06-13 MED ORDER — PREDNISONE 10 MG PO TABS: 10.0000 mg | ORAL_TABLET | Freq: Every day | ORAL | 0 refills | Status: DC

## 2022-06-13 NOTE — Telephone Encounter (Signed)
Spoke with Alden Benjamin who had record release signed at faxed to the appropriate facility.

## 2022-06-13 NOTE — Patient Instructions (Signed)
Recommendations Continue Breztri Aerosphere 2 puffs twice daily Continue Mucinex and flutter Check sputum sample Start prednisone 10 mg daily until follow-up We are requesting report and imaging from CT in August and September  If nodule larger on CT imaging we may discuss getting a PET scan since you are not a good candidate for bronchoscopy  Follow-up Recommend you schedule visit with Dr. Lamonte Sakai in November

## 2022-06-13 NOTE — Telephone Encounter (Signed)
Gloria Calderon this is a patient of Dr. Lamonte Sakai who he follows with irregular lung nodule LUL that was stable on our most reccent imaging from MAY 2023 when compared to March 2023.   Patient had CT outside hospital in [place] in August with Dr. Lynder Parents and again on 06/09/22. She was advised to see Dr. Lamonte Sakai d/t CT results. We have disc from August CT but theres a note on it that states it was unable to be uploaded to PACs. We will need to request records and new CD/imaging for review. I have no imaging or results from CT's to review. I had patient fill out medical release. Can you help with this.

## 2022-06-13 NOTE — Progress Notes (Unsigned)
$'@Patient'L$  ID: Gloria Calderon, female    DOB: 15-Aug-1966, 56 y.o.   MRN: [id number]  No chief complaint on file.   Referring provider: Jaynee Eagles, MD  HPI: 56 year old female, current every day smoker.  Past medical history significant for COPD, chronic respiratory failure with hypoxia, pulmonary nodule, hypothyroidism, GERD, polycythemia, vitamin D deficiency, class III angina, tobacco abuse.  Patient of Dr. Lamonte Sakai, last seen in office on 11/20/2021.   Previous [facility] pulmonary encounter:  ROV 11/20/21 --56 year old woman with a history of active tobacco use (100 pack years), smoking three-quarter pack a day.  She has been managed on Breztri.  Uses albuterol approximately 4-5x a day. Nebs 3x a day.  She is having a lot of congestion, is having more exertional SOB. She is coughing up brown/gray mucous. No hemoptysis. She was treated for an acute flare 2/16.  She had a 3.5 mm anterior right lower lobe pulmonary nodule on CT chest 11/2020 from OSH, repeat scan as below  CT scan of the chest 11/19/2021 reviewed by me, shows an area of elongated masslike parenchymal thickening left upper lobe 5 cm in largest dimension surrounded by extensive centrilobular emphysema bilaterally.  There is a 14 mm irregular right upper lobe nodule.   ROV 05/22/2022 --56 year old woman with severe COPD, continued tobacco use (100 pack years), managed on Breztri and uses frequent albuterol.  She has pulmonary nodular disease including a 14 mm irregular right upper lobe pulmonary nodule, some parenchymal thickening in left upper lobe 5 cm in largest dimension with surrounding centrilobular emphysema.  She was apparently admitted to outside hospital for COPD exacerbation and a repeat CT chest was done on 04/17/2022.  I have reviewed.  The left upper lobe nodular opacity is smaller.  The right upper lobe pulmonary nodule is stable in size.  I have sent the films from the outside hospital to be over read by our radiologists. Today  she reports that she is feeling better. Smoking 10 cig a day. She has exertional dyspnea.  Currently managed on breztri, uses albuterol and DuoNeb frequently.  She is smoking approximately Remains on Protonix, fluticasone nasal spray as needed, off Chantix    06/13/2022- interim hx  Patient presents today to review CT results.  She has a history of severe COPD.  She continues to smoke.  She is managed on Breztri and as needed albuterol.  Following her for 14 mm irregular right upper lobe pulmonary nodule.   Her last CT chest was done in May 2023 that showed slight decrease in size of left upper lobe nodule since 11/19/2021.  Dr. Lamonte Sakai recommending repeat imaging in March 2024.  No further imaging has been done since.  Went for routine visit Dr. Baird Cancer in [place], she was admitted to the hospital d/t congestion. She had CT scan august and Monday.   She has productive cough which causes voice hoarseness. She is taking mucinex.   Scarring vs neoplasm, surrounding emphysema ? Genetic testing    Allergies  Allergen Reactions   Isosorbide Nausea And Vomiting and Swelling    Severe headache   Levaquin [Levofloxacin] Hives   Morphine And Related Itching and Nausea And Vomiting    Immunization History  Administered Date(s) Administered   Influenza Split 08/15/2013, 05/26/2019   Influenza,inj,Quad PF,6+ Mos 05/25/2014, 07/22/2017, 07/08/2018, 06/21/2020, 08/13/2021   Moderna Sars-Covid-2 Vaccination 01/04/2020, 02/01/2020, 09/26/2020   Pneumococcal Polysaccharide-23 07/30/2015, 07/22/2017, 06/21/2020    Past Medical History:  Diagnosis Date   Anxiety  Arthritis    Asthma    Chronic back pain    Constipation 04/02/2017   COPD (chronic obstructive pulmonary disease) (HCC)    DDD (degenerative disc disease), lumbar    Depression    Dyspnea    Fibromyalgia    GERD (gastroesophageal reflux disease)    H/O seasonal allergies    Headache    Hypothyroidism    Osteoporosis     Pneumonia 2008   Ringing in ear, right    Sciatica    Spinal stenosis     Tobacco History: Social History   Tobacco Use  Smoking Status Every Day   Packs/day: 2.50   Years: 44.00   Total pack years: 110.00   Types: Cigarettes, E-cigarettes   Start date: 1977  Smokeless Tobacco Never  Tobacco Comments   10 or less  cigarettes daily ARJ 05/22/22   Ready to quit: Not Answered Counseling given: Not Answered Tobacco comments: 10 or less  cigarettes daily ARJ 05/22/22   Outpatient Medications Prior to Visit  Medication Sig Dispense Refill   acetaminophen (TYLENOL) 650 MG CR tablet Take 1,300 mg by mouth 2 (two) times daily.     albuterol (VENTOLIN HFA) 108 (90 Base) MCG/ACT inhaler 2 PUFFS EVERY 6 HOURS AS NEEDED FOR WHEEZING OR SHORTNESS OF BREATH (Patient taking differently: Inhale 2 puffs into the lungs every 6 (six) hours as needed for wheezing or shortness of breath.) 18 g 1   ALPRAZolam (XANAX) 1 MG tablet Take 1 tablet (1 mg total) by mouth 4 (four) times daily as needed for anxiety. (Patient taking differently: Take 1 mg by mouth 3 (three) times daily as needed for anxiety.) 120 tablet 0   amoxicillin-clavulanate (AUGMENTIN) 875-125 MG tablet Take 1 tablet by mouth 2 (two) times daily. 14 tablet 0   Aspirin-Acetaminophen-Caffeine 520-260-32.5 MG PACK Take by mouth.     Aspirin-Salicylamide-Caffeine (BC HEADACHE POWDER PO) Take 1 Package by mouth daily.     bisacodyl (DULCOLAX) 5 MG EC tablet Take 5 mg by mouth daily as needed for moderate constipation.      Budeson-Glycopyrrol-Formoterol (BREZTRI AEROSPHERE) 160-9-4.8 MCG/ACT AERO Inhale 2 puffs into the lungs in the morning and at bedtime. 32.1 g 3   Carboxymethylcellulose Sod PF 0.5 % SOLN Place 1 drop into both eyes 3 (three) times daily.      celecoxib (CELEBREX) 200 MG capsule Take 200 mg by mouth 2 (two) times daily.     Cholecalciferol (VITAMIN D) 125 MCG (5000 UT) CAPS Take 1,500 Units by mouth daily.     COLLAGEN PO Take  1 tablet by mouth daily at 6 (six) AM.     cyclobenzaprine (FLEXERIL) 10 MG tablet Take 30 mg by mouth 3 (three) times daily.      fluticasone (FLONASE) 50 MCG/ACT nasal spray Place 2 sprays into both nostrils daily.     guaifenesin (ROBITUSSIN) 100 MG/5ML syrup Take 200 mg by mouth at bedtime as needed for cough.     ipratropium-albuterol (DUONEB) 0.5-2.5 (3) MG/3ML SOLN NEBULIZE 1 VIAL EVERY 6 HOURS AS NEEDED FOR SHORTNESS OF BREATH (Patient taking differently: Take 3 mLs by nebulization every 4 (four) hours as needed.) 360 mL 1   levothyroxine (SYNTHROID, LEVOTHROID) 25 MCG tablet Take 25 mcg by mouth daily before breakfast.   11   Multiple Vitamins-Minerals (HAIR SKIN NAILS PO) Take 1 tablet by mouth daily.     nicotine (NICODERM CQ - DOSED IN MG/24 HOURS) 14 mg/24hr patch Place 1 patch (14 mg total)  onto the skin daily. 28 patch 0   oxyCODONE (ROXICODONE) 15 MG immediate release tablet Take 15 mg by mouth in the morning, at noon, and at bedtime.     OXYGEN Inhale 2.5-3 L into the lungs continuous.      pantoprazole (PROTONIX) 40 MG tablet Take 40 mg by mouth daily.     predniSONE (DELTASONE) 10 MG tablet Take 2 tablets daily x 1 week 14 tablet 0   pregabalin (LYRICA) 150 MG capsule Take 300 mg by mouth daily.      Probiotic Product ([employer] ADV PROBIOTIC GUMMIES PO) Take 1 tablet by mouth daily.     psyllium (METAMUCIL) 58.6 % powder Take 1 packet by mouth daily.     Spacer/Aero-Holding Chambers (OPTICHAMBER DIAMOND) MISC USE DAILY WITH INHALER AS DIRECTED 1 each 0   varenicline (CHANTIX PAK) 0.5 MG X 11 & 1 MG X 42 tablet Take one 0.5 mg tablet by mouth once daily for 3 days, then increase to one 0.5 mg tablet twice daily for 4 days, then increase to one 1 mg tablet twice daily. 53 tablet 0   varenicline (CHANTIX) 1 MG tablet Take 1 tablet (1 mg total) by mouth 2 (two) times daily. 60 tablet 1   No facility-administered medications prior to visit.      Review of Systems  Review of  Systems   Physical Exam  There were no vitals taken for this visit. Physical Exam Constitutional:      Appearance: Normal appearance. She is not ill-appearing.  Pulmonary:     Effort: Pulmonary effort is normal.     Breath sounds: Wheezing and rhonchi present.     Comments: On POC 5L Neurological:     General: No focal deficit present.     Mental Status: She is alert and oriented to person, place, and time. Mental status is at baseline.  Psychiatric:        Mood and Affect: Mood normal.        Behavior: Behavior normal.        Thought Content: Thought content normal.        Judgment: Judgment normal.      Lab Results:  CBC    Component Value Date/Time   WBC 5.2 06/02/2021 0313   RBC 4.03 06/02/2021 0313   HGB 13.0 06/02/2021 0313   HCT 42.9 06/02/2021 0313   PLT 221 06/02/2021 0313   MCV 106.5 (H) 06/02/2021 0313   MCH 32.3 06/02/2021 0313   MCHC 30.3 06/02/2021 0313   RDW 12.3 06/02/2021 0313   LYMPHSABS 1.9 10/22/2020 2232   MONOABS 0.4 10/22/2020 2232   EOSABS 0.2 10/22/2020 2232   BASOSABS 0.1 10/22/2020 2232    BMET    Component Value Date/Time   NA 139 06/02/2021 0313   K 3.7 06/02/2021 0313   CL 90 (L) 06/02/2021 0313   CO2 37 (H) 06/02/2021 0313   GLUCOSE 151 (H) 06/02/2021 0313   BUN 12 06/02/2021 0313   CREATININE 0.55 06/02/2021 0313   CALCIUM 8.7 (L) 06/02/2021 0313   GFRNONAA >60 06/02/2021 0313   GFRAA >60 07/12/2018 1146    BNP No results found for: "BNP"  ProBNP No results found for: "PROBNP"  Imaging: No results found.   Assessment & Plan:   No problem-specific Assessment & Plan notes found for this encounter.     Martyn Ehrich, NP 06/13/2022

## 2022-06-14 NOTE — Assessment & Plan Note (Signed)
-   Patient is being followed by our office for 72m irregular nodule LUL, CT imaging with our office in May 2023 showed decrease in size of that nodule. Dr. BLamonte Sakairecommended repeating CT imaging in March 2024. [facility] and was told to follow-up urgently with Dr. BLamonte Sakai There is no imaging report in care everywhere or PACS system. We [facility] imaging results [facility] If nodule larger may want to consider PET imaging as she would not be a good candidate for tissue sampling via bronchoscopy d.t emphysema and chronic respiratory failure.

## 2022-06-14 NOTE — Assessment & Plan Note (Signed)
-   O2 dependent. No increase in demand.  - Continue 3-5L supplemental oxygen to maintain O2 > 88-90%

## 2022-06-14 NOTE — Assessment & Plan Note (Signed)
-   Patient has productive cough. Continue Breztri Aerosphere and mucinex twice daily. Check sputum sample.

## 2022-06-16 LAB — RESPIRATORY CULTURE OR RESPIRATORY AND SPUTUM CULTURE
MICRO NUMBER:: 13987929
RESULT:: NORMAL
SPECIMEN QUALITY:: ADEQUATE

## 2022-08-01 ENCOUNTER — Telehealth: Payer: Self-pay | Admitting: Emergency Medicine

## 2022-08-01 MED ORDER — PREDNISONE 10 MG PO TABS: 10.0000 mg | ORAL_TABLET | Freq: Every day | ORAL | 0 refills | Status: DC

## 2022-08-01 NOTE — Telephone Encounter (Signed)
Called and spoke with patient. She stated that she believes she is having a COPD exacerbation. She has been off of the prednisone for the past 2 weeks. At the last visit she was advised to stay on prednisone '10mg'$  once daily until her next follow up. She is aware that I will send in the prednisone for her.   She has a productive cough with yellowish phlegm. Slight increase in SOB and wheezing. She tends to cough more at night. She wants to know if RB would be willing to send in some cough syrup for her.   Pharmacy is [facility].   RB, can you please advise? Thanks!

## 2022-08-01 NOTE — Telephone Encounter (Signed)
I would prefer to add back the prednisone, have her use OTC delsym for cough suppression. If she is not improving on the '10mg'$  then she will need to call us for a pred taper.

## 2022-08-01 NOTE — Telephone Encounter (Signed)
Called patient but she did not answer. Left message for her to call us back.  

## 2022-08-09 LAB — AFB CULTURE WITH SMEAR (NOT AT ARMC)
Acid Fast Culture: NEGATIVE
Acid Fast Smear: NEGATIVE

## 2022-08-21 ENCOUNTER — Ambulatory Visit: Payer: Medicare PPO | Admitting: Emergency Medicine

## 2022-09-03 ENCOUNTER — Telehealth (INDEPENDENT_AMBULATORY_CARE_PROVIDER_SITE_OTHER): Payer: Medicare PPO | Admitting: Emergency Medicine

## 2022-09-03 ENCOUNTER — Telehealth: Payer: Self-pay | Admitting: Emergency Medicine

## 2022-09-03 ENCOUNTER — Encounter: Payer: Self-pay | Admitting: Emergency Medicine

## 2022-09-03 DIAGNOSIS — J431 Panlobular emphysema: Secondary | ICD-10-CM | POA: Diagnosis not present

## 2022-09-03 MED ORDER — PREDNISONE 10 MG PO TABS: 10.0000 mg | ORAL_TABLET | Freq: Every day | ORAL | 0 refills | Status: DC

## 2022-09-03 MED ORDER — NICOTINE 14 MG/24HR TD PT24: 14.0000 mg | MEDICATED_PATCH | Freq: Every day | TRANSDERMAL | 0 refills | Status: DC

## 2022-09-03 NOTE — Progress Notes (Signed)
Virtual Visit via Video Note  I connected with Gloria Calderon on 09/03/22 at  2:30 PM EST by a video enabled telemedicine application and verified that I am speaking with the correct person using two identifiers.  Location: Patient: Home Provider: Office   I discussed the limitations of evaluation and management by telemedicine and the availability of in person appointments. The patient expressed understanding and agreed to proceed.  History of Present Illness: 56 year old woman with severe COPD who continues to smoke (over 100 pack years).  We have been managing her on Breztri, albuterol and prednisone 10 mg daily.  She also has pulmonary nodular disease with an abnormal CT scan of the chest, most recent scan 02/07/2022 that showed a slight decrease in size and irregular left upper lobe bandlike opacity at 3.1 x 1.9 cm, stable 1.4 cm medial right upper lobe nodule   Observations/Objective: She states that she got back on the prednisone '10mg'$ , but then she ran out 2 weeks ago.  She is back to having more exertional SOB, cough, sputum.  She is on [employer]. Uses albuterol  Smoking about 10 cig a day  Assessment and Plan: Severe COPD -continue her BD regimen as ordered -Restart her prednisone 10 mg once daily.  My intention is for her to stay on this chronically -Discussed smoking cessation.  She is gone down to 10 cigarettes daily and is on sure that she quit.  Not ready set a quit date.  She asked for a refill on nicotine patches 14 mg.  Abnormal CT chest -She has had multiple CT scans performed at [facility] by Dr. Baird Cancer.  I will review these.  She says that her most recent was in October.  I had initially planned to repeat her CT to follow her left upper lobe bandlike opacity in March or May (1 year after her stable scans here).  I will call her once I have had an opportunity to review and we can discuss next steps.  She may need further workup  Follow Up Instructions: I will call her  this week to discuss the scans   I discussed the assessment and treatment plan with the patient. The patient was provided an opportunity to ask questions and all were answered. The patient agreed with the plan and demonstrated an understanding of the instructions.   The patient was advised to call back or seek an in-person evaluation if the symptoms worsen or if the condition fails to improve as anticipated.  I provided 25 minutes of non-face-to-face time during this encounter.   Collene Gobble, MD

## 2022-09-03 NOTE — Telephone Encounter (Signed)
Reviewed her report from the CT chest that was done at [place] on 04/17/2022.  Her left upper lobe linear nodular opacity has decreased in size, now 2.7 x 1.6 cm.  The right upper lobe nodular density is stable in size 1.4 x 0.5 cm.  Findings are reassuring.  Her next CT chest should be in May 2024

## 2022-09-05 NOTE — Telephone Encounter (Signed)
Spoke with pt and reviewed dictated CT results. Pt stated understanding. Pt did again state she had a CT ran at [facility] on 06/17/2022. When this RN spoke with radiology at [facility] to obtain 04/17/22 CT report, tech informed RN that there where no images from that date. RN will attempt to locate images again.

## 2022-09-11 ENCOUNTER — Telehealth: Payer: Self-pay | Admitting: Primary Care

## 2022-09-11 DIAGNOSIS — R911 Solitary pulmonary nodule: Secondary | ICD-10-CM

## 2022-09-11 NOTE — Telephone Encounter (Signed)
Called and spoke with pt letting her know the info per [facility] and she verbalized understanding. Appt scheduled for pt with Dr. Lamonte Sakai 1/17  There is a CT Super D Order that was placed by [facility] today. Routing to [facility] so we can get the CT scheduled prior to pt's appt.

## 2022-09-11 NOTE — Telephone Encounter (Signed)
Needs repeat CT chest to follow-up on LUL lesion, she was due for follow-up in November with our office. Needs to see [place] after CT first available. Please let patient know.

## 2022-09-11 NOTE — Telephone Encounter (Signed)
Spoke with pt letting her know of her CT appt and she verbalized understanding. Nothing further needed.

## 2022-09-18 ENCOUNTER — Telehealth: Payer: Self-pay | Admitting: Emergency Medicine

## 2022-09-18 NOTE — Telephone Encounter (Signed)
Noted  

## 2022-09-18 NOTE — Telephone Encounter (Signed)
Hey  So since this patient has a CT already that is scheduled for January we can cancel the one for March 2024.  Its the same type of CT scan that is ordered for both. And if we look at new CT scan and need more imaging we can place a new order at that time.  Thank you   I will update Dr Lamonte Sakai on CT scan being done in January and that is why the one for march is being canceled.   Thank you again

## 2022-09-24 ENCOUNTER — Other Ambulatory Visit: Payer: Self-pay | Admitting: Emergency Medicine

## 2022-09-26 ENCOUNTER — Ambulatory Visit (HOSPITAL_COMMUNITY): Payer: Medicare PPO

## 2022-09-29 ENCOUNTER — Telehealth: Payer: Self-pay | Admitting: Emergency Medicine

## 2022-09-29 NOTE — Telephone Encounter (Signed)
You can use one of the blocked spots on 1/25 in the afternoon

## 2022-09-30 NOTE — Telephone Encounter (Signed)
Agree thank you 

## 2022-09-30 NOTE — Telephone Encounter (Signed)
Scheduled pt for 1/25 @ 215pm . Nothing further needed

## 2022-10-01 ENCOUNTER — Ambulatory Visit: Payer: Medicare PPO | Admitting: Emergency Medicine

## 2022-10-02 ENCOUNTER — Ambulatory Visit (HOSPITAL_COMMUNITY)
Admission: RE | Admit: 2022-10-02 | Discharge: 2022-10-02 | Disposition: A | Discharge status: Home / Self Care | Payer: Medicare PPO | Source: Ambulatory Visit | Attending: Primary Care | Admitting: Primary Care

## 2022-10-02 DIAGNOSIS — R911 Solitary pulmonary nodule: Secondary | ICD-10-CM | POA: Insufficient documentation

## 2022-10-03 NOTE — Progress Notes (Signed)
Please let patient know she has no new suspicious pulmonary nodule or mass.  Opacity left upper lobe is stable.  RUL lung nodule stable.  Please have patient keep appointment with Dr. Lamonte Sakai on 1/25.

## 2022-10-09 ENCOUNTER — Ambulatory Visit: Payer: Medicare PPO | Admitting: Emergency Medicine

## 2022-10-09 ENCOUNTER — Encounter: Payer: Self-pay | Admitting: Emergency Medicine

## 2022-10-09 VITALS — BP 136/74 | HR 110 | Temp 98.4°F | Ht 64.0 in | Wt 111.0 lb

## 2022-10-09 DIAGNOSIS — Z720 Tobacco use: Secondary | ICD-10-CM | POA: Diagnosis not present

## 2022-10-09 DIAGNOSIS — J9611 Chronic respiratory failure with hypoxia: Secondary | ICD-10-CM

## 2022-10-09 DIAGNOSIS — J431 Panlobular emphysema: Secondary | ICD-10-CM

## 2022-10-09 DIAGNOSIS — R911 Solitary pulmonary nodule: Secondary | ICD-10-CM

## 2022-10-09 NOTE — Progress Notes (Signed)
Subjective:    Patient ID: Gloria Calderon, female    DOB: 1966/05/09, 57 y.o.   MRN: [id number]  COPD She complains of cough, shortness of breath and wheezing. Associated symptoms include postnasal drip. Pertinent negatives include no appetite change, ear pain, fever, headaches, rhinorrhea, sneezing, sore throat or trouble swallowing. Her past medical history is significant for COPD.   ROV 05/22/2022 --57 year old woman with severe COPD, continued tobacco use (100 pack years), managed on Breztri and uses frequent albuterol.  She has pulmonary nodular disease including a 14 mm irregular right upper lobe pulmonary nodule, some parenchymal thickening in left upper lobe 5 cm in largest dimension with surrounding centrilobular emphysema.  She was apparently admitted to outside hospital for COPD exacerbation and a repeat CT chest was done on 04/17/2022.  I have reviewed.  The left upper lobe nodular opacity is smaller.  The right upper lobe pulmonary nodule is stable in size.  I have sent the films from the outside hospital to be over read by our radiologists. Today she reports that she is feeling better. Smoking 10 cig a day. She has exertional dyspnea.  Currently managed on breztri, uses albuterol and DuoNeb frequently.  She is smoking approximately Remains on Protonix, fluticasone nasal spray as needed, off Chantix  ROV 10/09/2022 --57 year old woman with active tobacco use and very severe COPD, frequent exacerbations and bronchitic symptoms.  She also has a history of pulmonary nodular disease and some linear parenchymal thickening that we have been following with repeat CTs.  Some of her CTs have been at [facility].  Overall the lesions of interest have been stable.  She is managed on Breztri, prednisone 10 mg.  She uses albuterol approximately 4x a day She reports that she has been doing fairly well. Same limitations. Some cough. 5L/min pulsed, 3.5-4 L/min continuous.  She is down to 5 cig a day, is vaping.    CT scan of the chest done 10/02/2022 reviewed by me, shows centrilobular and paraseptal emphysema with biapical pleural-parenchymal scarring unchanged.  There is a perihilar left upper lobe bandlike focus, again unchanged.  Her medial right upper lobe pulmonary nodule has decreased in size a 10 x 3 mm.  No other new suspicious lesions seen.   Review of Systems  Constitutional:  Negative for appetite change, fever and unexpected weight change.  HENT:  Positive for congestion and postnasal drip. Negative for dental problem, ear pain, nosebleeds, rhinorrhea, sinus pressure, sneezing, sore throat and trouble swallowing.   Eyes:  Negative for redness and itching.  Respiratory:  Positive for cough, chest tightness, shortness of breath and wheezing.   Cardiovascular:  Negative for palpitations and leg swelling.  Gastrointestinal:  Negative for abdominal pain, nausea and vomiting.  Genitourinary:  Negative for dysuria.  Musculoskeletal:  Negative for joint swelling.  Skin:  Negative for rash.  Neurological:  Negative for headaches.  Hematological:  Does not bruise/bleed easily.  Psychiatric/Behavioral:  Negative for dysphoric mood. The patient is not nervous/anxious.        Objective:   Physical Exam Vitals:   10/09/22 1459  BP: 136/74  Pulse: (!) 110  Temp: 98.4 F (36.9 C)  TempSrc: Oral  SpO2: 97%  Weight: 111 lb (50.3 kg)  Height: '5\' 4"'$  (1.626 m)   Gen: Pleasant, well-nourished, in no distress, ill appearing  ENT: No lesions,  mouth clear,  oropharynx clear, nasal congestion, strong voice  Neck: No JVD, no stridor  Lungs: No use of accessory muscles, B exp wheezes  Cardiovascular: RRR, heart sounds normal, no murmur or gallops, no peripheral edema  Musculoskeletal: No deformities, no cyanosis or clubbing  Neuro: alert, non focal  Skin: Warm, no lesions or rashes      Assessment & Plan:  Pulmonary nodule Medial right upper lobe pulmonary nodule slightly smaller, 10  mm in largest dimension.  The perihilar left upper lobe bandlike focus remains unchanged on our films and also films from outside hospital [facility]).    We reviewed your CT scan of the chest today.  It is stable to improved compared with your priors.  Good news. We will plan to repeat your CT chest in July 2024.   COPD (chronic obstructive pulmonary disease) (HCC) Continue your Breztri 2 puffs twice a day.  Rinse and gargle after using. Continue your albuterol either 1 nebulizer treatment or 2 puffs up to every 4 hours if needed for shortness of breath, chest tightness, wheezing. Continue your prednisone 10 mg once daily.   Chronic respiratory failure with hypoxia (HCC) Continue your oxygen at 3.5-5 L/min depending on your level of exertion and whether you are using the continuous flow or pulsed flow system.  Tobacco abuse Continues to smoke.  This is probably the most important avenue that we could pursue to try and help her.  She has cut down some.  Need to concentrate on full section.  Baltazar Apo, MD, PhD 10/14/2022, 12:38 PM [facility] [phone] or if no answer [phone]

## 2022-10-09 NOTE — Patient Instructions (Signed)
Congratulations on decreasing your cigarettes.  Keep up the good work continue to decrease.  Ultimate goal will be to stop altogether. We reviewed your CT scan of the chest today.  It is stable to improved compared with your priors.  Good news. We will plan to repeat your CT chest in July 2024. Continue your Breztri 2 puffs twice a day.  Rinse and gargle after using. Continue your albuterol either 1 nebulizer treatment or 2 puffs up to every 4 hours if needed for shortness of breath, chest tightness, wheezing. Continue your prednisone 10 mg once daily. Continue your oxygen at 3.5-5 L/min depending on your level of exertion and whether you are using the continuous flow or pulsed flow system. Follow Dr. Lamonte Sakai in July after your CT so we can review the results together.

## 2022-10-14 NOTE — Assessment & Plan Note (Signed)
Medial right upper lobe pulmonary nodule slightly smaller, 10 mm in largest dimension.  The perihilar left upper lobe bandlike focus remains unchanged on our films and also films from outside hospital [facility]).    We reviewed your CT scan of the chest today.  It is stable to improved compared with your priors.  Good news. We will plan to repeat your CT chest in July 2024.

## 2022-10-14 NOTE — Assessment & Plan Note (Signed)
Continue your oxygen at 3.5-5 L/min depending on your level of exertion and whether you are using the continuous flow or pulsed flow system.

## 2022-10-14 NOTE — Assessment & Plan Note (Signed)
Continue your Breztri 2 puffs twice a day.  Rinse and gargle after using. Continue your albuterol either 1 nebulizer treatment or 2 puffs up to every 4 hours if needed for shortness of breath, chest tightness, wheezing. Continue your prednisone 10 mg once daily.

## 2022-10-14 NOTE — Assessment & Plan Note (Signed)
Continues to smoke.  This is probably the most important avenue that we could pursue to try and help her.  She has cut down some.  Need to concentrate on full section.

## 2022-10-26 ENCOUNTER — Inpatient Hospital Stay (HOSPITAL_COMMUNITY)
Admission: EM | Admit: 2022-10-26 | Discharge: 2022-10-28 | DRG: 190 | Disposition: A | Discharge status: Home Health | Payer: Medicare PPO | Attending: Family Medicine | Admitting: Family Medicine

## 2022-10-26 ENCOUNTER — Emergency Department (HOSPITAL_COMMUNITY): Payer: Medicare PPO

## 2022-10-26 ENCOUNTER — Encounter (HOSPITAL_COMMUNITY): Payer: Self-pay | Admitting: Emergency Medicine

## 2022-10-26 ENCOUNTER — Other Ambulatory Visit: Payer: Self-pay

## 2022-10-26 DIAGNOSIS — K219 Gastro-esophageal reflux disease without esophagitis: Secondary | ICD-10-CM | POA: Diagnosis present

## 2022-10-26 DIAGNOSIS — Z9981 Dependence on supplemental oxygen: Secondary | ICD-10-CM

## 2022-10-26 DIAGNOSIS — J9621 Acute and chronic respiratory failure with hypoxia: Secondary | ICD-10-CM | POA: Diagnosis not present

## 2022-10-26 DIAGNOSIS — K5909 Other constipation: Secondary | ICD-10-CM | POA: Diagnosis present

## 2022-10-26 DIAGNOSIS — G8929 Other chronic pain: Secondary | ICD-10-CM | POA: Insufficient documentation

## 2022-10-26 DIAGNOSIS — M810 Age-related osteoporosis without current pathological fracture: Secondary | ICD-10-CM | POA: Diagnosis present

## 2022-10-26 DIAGNOSIS — Z91199 Patient's noncompliance with other medical treatment and regimen due to unspecified reason: Secondary | ICD-10-CM

## 2022-10-26 DIAGNOSIS — Z79899 Other long term (current) drug therapy: Secondary | ICD-10-CM

## 2022-10-26 DIAGNOSIS — Z7951 Long term (current) use of inhaled steroids: Secondary | ICD-10-CM

## 2022-10-26 DIAGNOSIS — Z825 Family history of asthma and other chronic lower respiratory diseases: Secondary | ICD-10-CM

## 2022-10-26 DIAGNOSIS — Z885 Allergy status to narcotic agent status: Secondary | ICD-10-CM

## 2022-10-26 DIAGNOSIS — F419 Anxiety disorder, unspecified: Secondary | ICD-10-CM | POA: Diagnosis present

## 2022-10-26 DIAGNOSIS — J441 Chronic obstructive pulmonary disease with (acute) exacerbation: Principal | ICD-10-CM | POA: Diagnosis present

## 2022-10-26 DIAGNOSIS — E039 Hypothyroidism, unspecified: Secondary | ICD-10-CM | POA: Diagnosis present

## 2022-10-26 DIAGNOSIS — Z881 Allergy status to other antibiotic agents status: Secondary | ICD-10-CM

## 2022-10-26 DIAGNOSIS — M797 Fibromyalgia: Secondary | ICD-10-CM | POA: Diagnosis present

## 2022-10-26 DIAGNOSIS — Z888 Allergy status to other drugs, medicaments and biological substances status: Secondary | ICD-10-CM

## 2022-10-26 DIAGNOSIS — J9622 Acute and chronic respiratory failure with hypercapnia: Secondary | ICD-10-CM

## 2022-10-26 DIAGNOSIS — Z833 Family history of diabetes mellitus: Secondary | ICD-10-CM

## 2022-10-26 DIAGNOSIS — Z720 Tobacco use: Secondary | ICD-10-CM | POA: Diagnosis present

## 2022-10-26 DIAGNOSIS — F17210 Nicotine dependence, cigarettes, uncomplicated: Secondary | ICD-10-CM | POA: Diagnosis present

## 2022-10-26 DIAGNOSIS — I5033 Acute on chronic diastolic (congestive) heart failure: Secondary | ICD-10-CM | POA: Diagnosis present

## 2022-10-26 DIAGNOSIS — Z1152 Encounter for screening for COVID-19: Secondary | ICD-10-CM

## 2022-10-26 LAB — RESP PANEL BY RT-PCR (RSV, FLU A&B, COVID)  RVPGX2
Influenza A by PCR: NEGATIVE
Influenza B by PCR: NEGATIVE
Resp Syncytial Virus by PCR: NEGATIVE
SARS Coronavirus 2 by RT PCR: NEGATIVE

## 2022-10-26 LAB — CBC WITH DIFFERENTIAL/PLATELET
Abs Immature Granulocytes: 0.08 10*3/uL — ABNORMAL HIGH (ref 0.00–0.07)
Basophils Absolute: 0 10*3/uL (ref 0.0–0.1)
Basophils Relative: 0 %
Eosinophils Absolute: 0 10*3/uL (ref 0.0–0.5)
Eosinophils Relative: 0 %
HCT: 45.3 % (ref 36.0–46.0)
Hemoglobin: 13.5 g/dL (ref 12.0–15.0)
Immature Granulocytes: 1 %
Lymphocytes Relative: 4 %
Lymphs Abs: 0.4 10*3/uL — ABNORMAL LOW (ref 0.7–4.0)
MCH: 32.1 pg (ref 26.0–34.0)
MCHC: 29.8 g/dL — ABNORMAL LOW (ref 30.0–36.0)
MCV: 107.6 fL — ABNORMAL HIGH (ref 80.0–100.0)
Monocytes Absolute: 0.5 10*3/uL (ref 0.1–1.0)
Monocytes Relative: 5 %
Neutro Abs: 8.8 10*3/uL — ABNORMAL HIGH (ref 1.7–7.7)
Neutrophils Relative %: 90 %
Platelets: 252 10*3/uL (ref 150–400)
RBC: 4.21 MIL/uL (ref 3.87–5.11)
RDW: 12.7 % (ref 11.5–15.5)
WBC: 9.8 10*3/uL (ref 4.0–10.5)
nRBC: 0 % (ref 0.0–0.2)

## 2022-10-26 LAB — BLOOD GAS, VENOUS
Acid-Base Excess: 16.5 mmol/L — ABNORMAL HIGH (ref 0.0–2.0)
Bicarbonate: 45.2 mmol/L — ABNORMAL HIGH (ref 20.0–28.0)
Drawn by: 66400
FIO2: 40 %
O2 Saturation: 99.1 %
Patient temperature: 36.8
pCO2, Ven: 72 mmHg (ref 44–60)
pH, Ven: 7.4 (ref 7.25–7.43)
pO2, Ven: 100 mmHg — ABNORMAL HIGH (ref 32–45)

## 2022-10-26 LAB — COMPREHENSIVE METABOLIC PANEL
ALT: 28 U/L (ref 0–44)
AST: 33 U/L (ref 15–41)
Albumin: 3.9 g/dL (ref 3.5–5.0)
Alkaline Phosphatase: 85 U/L (ref 38–126)
Anion gap: 11 (ref 5–15)
BUN: 14 mg/dL (ref 6–20)
CO2: 37 mmol/L — ABNORMAL HIGH (ref 22–32)
Calcium: 8.6 mg/dL — ABNORMAL LOW (ref 8.9–10.3)
Chloride: 91 mmol/L — ABNORMAL LOW (ref 98–111)
Creatinine, Ser: 0.54 mg/dL (ref 0.44–1.00)
GFR, Estimated: >60 mL/min (ref >60)
Glucose, Bld: 119 mg/dL — ABNORMAL HIGH (ref 70–99)
Potassium: 4.7 mmol/L (ref 3.5–5.1)
Sodium: 139 mmol/L (ref 135–145)
Total Bilirubin: 0.6 mg/dL (ref 0.3–1.2)
Total Protein: 7.7 g/dL (ref 6.5–8.1)

## 2022-10-26 LAB — TROPONIN I (HIGH SENSITIVITY): Troponin I (High Sensitivity): 5 ng/L (ref <18)

## 2022-10-26 MED ORDER — METHYLPREDNISOLONE SODIUM SUCC 125 MG IJ SOLR
125.0000 mg | Freq: Once | INTRAMUSCULAR | Status: AC
  Administered 2022-10-26: 125 mg via INTRAVENOUS
  Filled 2022-10-26: qty 2

## 2022-10-26 MED ORDER — UMECLIDINIUM BROMIDE 62.5 MCG/ACT IN AEPB
1.0000 | INHALATION_SPRAY | Freq: Every day | RESPIRATORY_TRACT | Status: DC
  Administered 2022-10-27 – 2022-10-28 (×2): 1 via RESPIRATORY_TRACT
  Filled 2022-10-26: qty 7

## 2022-10-26 MED ORDER — ALBUTEROL SULFATE (2.5 MG/3ML) 0.083% IN NEBU
2.5000 mg | INHALATION_SOLUTION | Freq: Once | RESPIRATORY_TRACT | Status: AC
  Administered 2022-10-26: 2.5 mg via RESPIRATORY_TRACT
  Filled 2022-10-26: qty 3

## 2022-10-26 MED ORDER — QUETIAPINE FUMARATE 25 MG PO TABS
50.0000 mg | ORAL_TABLET | Freq: Every day | ORAL | Status: DC
  Administered 2022-10-27: 50 mg via ORAL
  Filled 2022-10-26: qty 2

## 2022-10-26 MED ORDER — ACETAMINOPHEN 325 MG PO TABS
650.0000 mg | ORAL_TABLET | Freq: Four times a day (QID) | ORAL | Status: DC | PRN
  Administered 2022-10-27 – 2022-10-28 (×3): 650 mg via ORAL
  Filled 2022-10-26 (×4): qty 2

## 2022-10-26 MED ORDER — PANTOPRAZOLE SODIUM 40 MG PO TBEC
40.0000 mg | DELAYED_RELEASE_TABLET | Freq: Every day | ORAL | Status: DC
  Administered 2022-10-27 – 2022-10-28 (×2): 40 mg via ORAL
  Filled 2022-10-26 (×2): qty 1

## 2022-10-26 MED ORDER — ONDANSETRON HCL 4 MG PO TABS
4.0000 mg | ORAL_TABLET | Freq: Four times a day (QID) | ORAL | Status: DC | PRN
  Filled 2022-10-26 (×2): qty 1

## 2022-10-26 MED ORDER — MOMETASONE FURO-FORMOTEROL FUM 200-5 MCG/ACT IN AERO
2.0000 | INHALATION_SPRAY | Freq: Two times a day (BID) | RESPIRATORY_TRACT | Status: DC
  Administered 2022-10-26 – 2022-10-28 (×4): 2 via RESPIRATORY_TRACT
  Filled 2022-10-26: qty 8.8

## 2022-10-26 MED ORDER — ALBUTEROL SULFATE (2.5 MG/3ML) 0.083% IN NEBU
2.5000 mg | INHALATION_SOLUTION | RESPIRATORY_TRACT | Status: DC | PRN
  Administered 2022-10-26 – 2022-10-28 (×2): 2.5 mg via RESPIRATORY_TRACT
  Filled 2022-10-26 (×2): qty 3

## 2022-10-26 MED ORDER — ONDANSETRON HCL 4 MG/2ML IJ SOLN: 4.0000 mg | Freq: Four times a day (QID) | INTRAMUSCULAR | Status: DC | PRN

## 2022-10-26 MED ORDER — NICOTINE 14 MG/24HR TD PT24
14.0000 mg | MEDICATED_PATCH | Freq: Every day | TRANSDERMAL | Status: DC
  Administered 2022-10-26 – 2022-10-28 (×3): 14 mg via TRANSDERMAL
  Filled 2022-10-26 (×3): qty 1

## 2022-10-26 MED ORDER — ALPRAZOLAM 1 MG PO TABS
1.0000 mg | ORAL_TABLET | Freq: Two times a day (BID) | ORAL | Status: DC | PRN
  Administered 2022-10-27 – 2022-10-28 (×2): 1 mg via ORAL
  Filled 2022-10-26 (×4): qty 1

## 2022-10-26 MED ORDER — GUAIFENESIN 100 MG/5ML PO LIQD
200.0000 mg | Freq: Every evening | ORAL | Status: DC | PRN
  Filled 2022-10-26: qty 5

## 2022-10-26 MED ORDER — IPRATROPIUM-ALBUTEROL 0.5-2.5 (3) MG/3ML IN SOLN
3.0000 mL | Freq: Four times a day (QID) | RESPIRATORY_TRACT | Status: DC
  Administered 2022-10-26 – 2022-10-28 (×7): 3 mL via RESPIRATORY_TRACT
  Filled 2022-10-26 (×8): qty 3

## 2022-10-26 MED ORDER — OXYCODONE HCL 5 MG PO TABS
10.0000 mg | ORAL_TABLET | Freq: Four times a day (QID) | ORAL | Status: DC | PRN
  Administered 2022-10-27 – 2022-10-28 (×2): 10 mg via ORAL
  Filled 2022-10-26 (×3): qty 2

## 2022-10-26 MED ORDER — PREDNISONE 20 MG PO TABS: 40.0000 mg | ORAL_TABLET | Freq: Every day | ORAL | Status: DC

## 2022-10-26 MED ORDER — CYCLOBENZAPRINE HCL 10 MG PO TABS
10.0000 mg | ORAL_TABLET | Freq: Three times a day (TID) | ORAL | Status: DC
  Administered 2022-10-26 – 2022-10-28 (×4): 10 mg via ORAL
  Filled 2022-10-26 (×5): qty 1

## 2022-10-26 MED ORDER — FLUOXETINE HCL 20 MG PO CAPS
40.0000 mg | ORAL_CAPSULE | Freq: Every day | ORAL | Status: DC
  Administered 2022-10-27 – 2022-10-28 (×2): 40 mg via ORAL
  Filled 2022-10-26 (×2): qty 2

## 2022-10-26 MED ORDER — LEVOTHYROXINE SODIUM 25 MCG PO TABS
25.0000 ug | ORAL_TABLET | Freq: Every day | ORAL | Status: DC
  Administered 2022-10-27 – 2022-10-28 (×2): 25 ug via ORAL
  Filled 2022-10-26 (×2): qty 1

## 2022-10-26 MED ORDER — QUETIAPINE FUMARATE 25 MG PO TABS: 50.0000 mg | ORAL_TABLET | Freq: Every day | ORAL | Status: DC

## 2022-10-26 MED ORDER — OXYCODONE HCL 5 MG PO TABS
5.0000 mg | ORAL_TABLET | ORAL | Status: DC | PRN
  Administered 2022-10-26: 5 mg via ORAL
  Filled 2022-10-26: qty 1

## 2022-10-26 MED ORDER — HEPARIN SODIUM (PORCINE) 5000 UNIT/ML IJ SOLN
5000.0000 [IU] | Freq: Three times a day (TID) | INTRAMUSCULAR | Status: DC
  Administered 2022-10-26 – 2022-10-28 (×5): 5000 [IU] via SUBCUTANEOUS
  Filled 2022-10-26 (×5): qty 1

## 2022-10-26 MED ORDER — BUDESON-GLYCOPYRROL-FORMOTEROL 160-9-4.8 MCG/ACT IN AERO: 2.0000 | INHALATION_SPRAY | Freq: Two times a day (BID) | RESPIRATORY_TRACT | Status: DC

## 2022-10-26 MED ORDER — METHYLPREDNISOLONE SODIUM SUCC 125 MG IJ SOLR
125.0000 mg | Freq: Two times a day (BID) | INTRAMUSCULAR | Status: DC
  Administered 2022-10-27: 125 mg via INTRAVENOUS
  Filled 2022-10-26: qty 2

## 2022-10-26 MED ORDER — ACETAMINOPHEN 650 MG RE SUPP: 650.0000 mg | Freq: Four times a day (QID) | RECTAL | Status: DC | PRN

## 2022-10-26 MED ORDER — PREGABALIN 75 MG PO CAPS
150.0000 mg | ORAL_CAPSULE | Freq: Every day | ORAL | Status: DC
  Administered 2022-10-27 – 2022-10-28 (×2): 150 mg via ORAL
  Filled 2022-10-26 (×2): qty 2

## 2022-10-26 MED ORDER — ALBUTEROL SULFATE (2.5 MG/3ML) 0.083% IN NEBU
5.0000 mg | INHALATION_SOLUTION | Freq: Once | RESPIRATORY_TRACT | Status: AC
  Administered 2022-10-26: 5 mg via RESPIRATORY_TRACT
  Filled 2022-10-26: qty 6

## 2022-10-26 MED ORDER — IPRATROPIUM BROMIDE 0.02 % IN SOLN
0.5000 mg | Freq: Once | RESPIRATORY_TRACT | Status: AC
  Administered 2022-10-26: 0.5 mg via RESPIRATORY_TRACT
  Filled 2022-10-26: qty 2.5

## 2022-10-26 MED ORDER — FLUTICASONE FUROATE-VILANTEROL 100-25 MCG/ACT IN AEPB: 1.0000 | INHALATION_SPRAY | Freq: Every day | RESPIRATORY_TRACT | Status: DC

## 2022-10-26 NOTE — Progress Notes (Signed)
Patient has decline CPAP at this time; c/o of really bad headache and not ble to tolerate anything around her head. RT has unit available if and when patient requires it.

## 2022-10-26 NOTE — Assessment & Plan Note (Signed)
-   Xanax and Prozac

## 2022-10-26 NOTE — ED Notes (Signed)
ED TO INPATIENT HANDOFF REPORT  ED Nurse Name and Phone #: Anderson Malta [phone]  S Name/Age/Gender Gloria Calderon 57 y.o. female Room/Bed: APA11/APA11  Code Status   Code Status: Full Code  Home/SNF/Other Home Patient oriented to: self, place, time, and situation Is this baseline? Yes   Triage Complete: Triage complete  Chief Complaint COPD exacerbation [facility]) [J44.1]  Triage Note Pt arrives to ER form home with c/o increasing SHOB.  Pt states she can not catch her breath, reports chest pain.  Pt on 4L [place] at home. Pt with hx of COPD.   Allergies Allergies  Allergen Reactions   Isosorbide Nausea And Vomiting and Swelling    Severe headache   Levaquin [Levofloxacin] Hives   Morphine And Related Itching and Nausea And Vomiting    Level of Care/Admitting Diagnosis ED Disposition     ED Disposition  Admit   Condition  --   [place]: [facility] [id number]  Level of Care: Telemetry [5]  Covid Evaluation: Confirmed COVID Negative  Diagnosis: COPD exacerbation [facility]) [id number]  Admitting Physician: Rolla Plate [id number]  Attending Physician: Rolla Plate [id number]          B Medical/Surgery History Past Medical History:  Diagnosis Date   Anxiety    Arthritis    Asthma    Chronic back pain    Constipation 04/02/2017   COPD (chronic obstructive pulmonary disease) ([place])    DDD (degenerative disc disease), lumbar    Depression    Dyspnea    Fibromyalgia    GERD (gastroesophageal reflux disease)    H/O seasonal allergies    Headache    Hypothyroidism    Osteoporosis    Pneumonia 2008   Ringing in ear, right    Sciatica    Spinal stenosis    Past Surgical History:  Procedure Laterality Date   APPENDECTOMY     CHOLECYSTECTOMY     COLONOSCOPY WITH PROPOFOL N/A 03/25/2019   Procedure: COLONOSCOPY WITH PROPOFOL;  Surgeon: Rogene Houston, MD;  [facility];  Service: Endoscopy;  Laterality: N/A;  11:05    LEFT HEART CATH AND CORONARY ANGIOGRAPHY N/A 06/17/2018   Procedure: LEFT HEART CATH AND CORONARY ANGIOGRAPHY;  Surgeon: Leonie Man, MD;  Location: [facility];  Service: Cardiovascular;  Laterality: N/A;   POLYPECTOMY  03/25/2019   Procedure: POLYPECTOMY;  Surgeon: Rogene Houston, MD;  Location[facility];  Service: Endoscopy;;  Hepatic flexure, sigmoid colon     A IV Location/Drains/Wounds Patient Lines/Drains/Airways Status     Active Line/Drains/Airways     Name Placement date Placement time Site Days   Peripheral IV 10/26/22 20 G Right Antecubital 10/26/22  1434  Antecubital  less than 1            Intake/Output Last 24 hours No intake or output data in the 24 hours ending 10/26/22 1742  Labs/Imaging Results for orders placed or performed during the hospital encounter of 10/26/22 (from the past 48 hour(s))  Comprehensive metabolic panel     Status: Abnormal   Collection Time: 10/26/22  2:25 PM  Result Value Ref Range   Sodium 139 135 - 145 mmol/L   Potassium 4.7 3.5 - 5.1 mmol/L   Chloride 91 (L) 98 - 111 mmol/L   CO2 37 (H) 22 - 32 mmol/L   Glucose, Bld 119 (H) 70 - 99 mg/dL    Comment: Glucose reference range applies only to samples taken after  fasting for at least 8 hours.   BUN 14 6 - 20 mg/dL   Creatinine, Ser 0.54 0.44 - 1.00 mg/dL   Calcium 8.6 (L) 8.9 - 10.3 mg/dL   Total Protein 7.7 6.5 - 8.1 g/dL   Albumin 3.9 3.5 - 5.0 g/dL   AST 33 15 - 41 U/L   ALT 28 0 - 44 U/L   Alkaline Phosphatase 85 38 - 126 U/L   Total Bilirubin 0.6 0.3 - 1.2 mg/dL   GFR, Estimated >60 >60 mL/min    Comment: (NOTE) Calculated using the CKD-EPI Creatinine Equation (2021)    Anion gap 11 5 - 15    Comment: Performed at [facility], [address], [place], [place] [address]  CBC with Differential     Status: Abnormal   Collection Time: 10/26/22  2:25 PM  Result Value Ref Range   WBC 9.8 4.0 - 10.5 K/uL   RBC 4.21 3.87 - 5.11 MIL/uL   Hemoglobin 13.5 12.0 -  15.0 g/dL   HCT 45.3 36.0 - 46.0 %   MCV 107.6 (H) 80.0 - 100.0 fL   MCH 32.1 26.0 - 34.0 pg   MCHC 29.8 (L) 30.0 - 36.0 g/dL   RDW 12.7 11.5 - 15.5 %   Platelets 252 150 - 400 K/uL   nRBC 0.0 0.0 - 0.2 %   Neutrophils Relative % 90 %   Neutro Abs 8.8 (H) 1.7 - 7.7 K/uL   Lymphocytes Relative 4 %   Lymphs Abs 0.4 (L) 0.7 - 4.0 K/uL   Monocytes Relative 5 %   Monocytes Absolute 0.5 0.1 - 1.0 K/uL   Eosinophils Relative 0 %   Eosinophils Absolute 0.0 0.0 - 0.5 K/uL   Basophils Relative 0 %   Basophils Absolute 0.0 0.0 - 0.1 K/uL   Immature Granulocytes 1 %   Abs Immature Granulocytes 0.08 (H) 0.00 - 0.07 K/uL    Comment: Performed at [facility], [address] [place], [place] [address]  Resp panel by RT-PCR (RSV, Flu A&B, Covid) Anterior Nasal Swab     Status: None   Collection Time: 10/26/22  2:37 PM   Specimen: Anterior Nasal Swab  Result Value Ref Range   SARS Coronavirus 2 by RT PCR NEGATIVE NEGATIVE    Comment: (NOTE) SARS-CoV-2 target nucleic acids are NOT DETECTED.  The SARS-CoV-2 RNA is generally detectable in upper respiratory specimens during the acute phase of infection. The lowest concentration of SARS-CoV-2 viral copies this assay can detect is 138 copies/mL. A negative result does not preclude SARS-Cov-2 infection and should not be used as the sole basis for treatment or other patient management decisions. A negative result may occur with  improper specimen collection/handling, submission of specimen other than nasopharyngeal swab, presence of viral mutation(s) within the areas targeted by this assay, and inadequate number of viral copies(<138 copies/mL). A negative result must be combined with clinical observations, patient history, and epidemiological information. The expected result is Negative.  Fact Sheet for Patients:  EntrepreneurPulse.com.au  Fact Sheet for Healthcare Providers:  IncredibleEmployment.be  This  test is no t yet approved or cleared by the [place] FDA and  has been authorized for detection and/or diagnosis of SARS-CoV-2 by FDA under an Emergency Use Authorization (EUA). This EUA will remain  in effect (meaning this test can be used) for the duration of the COVID-19 declaration under Section 564(b)(1) of the Act, 21 U.S.C.section 360bbb-3(b)(1), unless the authorization is terminated  or revoked sooner.  Influenza A by PCR NEGATIVE NEGATIVE   Influenza B by PCR NEGATIVE NEGATIVE    Comment: (NOTE) The Xpert Xpress SARS-CoV-2/FLU/RSV plus assay is intended as an aid in the diagnosis of influenza from Nasopharyngeal swab specimens and should not be used as a sole basis for treatment. Nasal washings and aspirates are unacceptable for Xpert Xpress SARS-CoV-2/FLU/RSV testing.  Fact Sheet for Patients: EntrepreneurPulse.com.au  Fact Sheet for Healthcare Providers: IncredibleEmployment.be  This test is not yet approved or cleared by the [place] FDA and has been authorized for detection and/or diagnosis of SARS-CoV-2 by FDA under an Emergency Use Authorization (EUA). This EUA will remain in effect (meaning this test can be used) for the duration of the COVID-19 declaration under Section 564(b)(1) of the Act, 21 U.S.C. section 360bbb-3(b)(1), unless the authorization is terminated or revoked.     Resp Syncytial Virus by PCR NEGATIVE NEGATIVE    Comment: (NOTE) Fact Sheet for Patients: EntrepreneurPulse.com.au  Fact Sheet for Healthcare Providers: IncredibleEmployment.be  This test is not yet approved or cleared by the [place] FDA and has been authorized for detection and/or diagnosis of SARS-CoV-2 by FDA under an Emergency Use Authorization (EUA). This EUA will remain in effect (meaning this test can be used) for the duration of the COVID-19 declaration under Section 564(b)(1) of  the Act, 21 U.S.C. section 360bbb-3(b)(1), unless the authorization is terminated or revoked.  Performed at [facility], [address], [place], [place] [address]   Blood gas, venous     Status: Abnormal   Collection Time: 10/26/22  5:31 PM  Result Value Ref Range   FIO2 40.0 %   pH, Ven 7.4 7.25 - 7.43   pCO2, Ven 72 (HH) 44 - 60 mmHg    Comment: CRITICAL RESULT CALLED TO, READ BACK BY AND VERIFIED WITH: Walker Paddack,J ON 10/26/22 @ 1738 BY ACOSTA,A    pO2, Ven 100 (H) 32 - 45 mmHg   Bicarbonate 45.2 (H) 20.0 - 28.0 mmol/L   Acid-Base Excess 16.5 (H) 0.0 - 2.0 mmol/L   O2 Saturation 99.1 %   Patient temperature 36.8    Collection site BLOOD RIGHT HAND    Drawn by [phone]     Comment: Performed at [facility], [address], [place], [place] [address]   DG Chest Portable 1 View  Result Date: 10/26/2022 CLINICAL DATA:  Some chest tightness. Has had a cough she states is little productive. EXAM: PORTABLE CHEST 1 VIEW COMPARISON:  CT chest 10/02/2022, CT chest 02/07/2022 FINDINGS: Bilateral small pleural effusions. Bilateral mild interstitial thickening. Left suprahilar band like opacity unchanged compared with the prior CT dated 02/07/2022. Lungs are hyperinflated as can be seen with COPD. No focal consolidation. No pneumothorax. Stable cardiomediastinal silhouette. No acute osseous abnormality. IMPRESSION: 1. Bilateral interstitial thickening and bilateral small pleural effusions concerning for mild CHF. 2. COPD. Electronically Signed   By: Kathreen Devoid M.D.   On: 10/26/2022 15:12    Pending Labs [employer] (From admission, onward)     Start     Ordered   Signed and Held  HIV Antibody (routine testing w rflx)  (HIV Antibody (Routine testing w reflex) panel)  Once,   R        Signed and Held   Signed and Held  Expectorated Sputum Assessment w Gram Stain, Rflx to Resp Cult  (COPD / Pneumonia / Cellulitis / Lower Extremity Wound)  Once,   R        Signed and Held  Signed and Held   Comprehensive metabolic panel  Tomorrow morning,   R        Signed and Held   Signed and Held  Magnesium  Tomorrow morning,   R        Signed and Held   Signed and Held  CBC with Differential/Platelet  Tomorrow morning,   R        Signed and Held            Vitals/Pain Today's Vitals   10/26/22 1413 10/26/22 1415 10/26/22 1418  BP: (!) 168/80    Pulse: 98    Resp: (!) 22    Temp:   98.3 F (36.8 C)  SpO2: 97%    Weight:  49.4 kg   Height:  5' 4"$  (1.626 m)   PainSc:  8      Isolation Precautions No active isolations  Medications Medications  albuterol (PROVENTIL) (2.5 MG/3ML) 0.083% nebulizer solution 5 mg (5 mg Nebulization Given 10/26/22 1427)  ipratropium (ATROVENT) nebulizer solution 0.5 mg (0.5 mg Nebulization Given 10/26/22 1427)  methylPREDNISolone sodium succinate (SOLU-MEDROL) 125 mg/2 mL injection 125 mg (125 mg Intravenous Given 10/26/22 1430)  albuterol (PROVENTIL) (2.5 MG/3ML) 0.083% nebulizer solution 2.5 mg (2.5 mg Nebulization Given 10/26/22 1716)    Mobility non-ambulatory     Focused Assessments Pulmonary Assessment Handoff:  Lung sounds: Bilateral Breath Sounds: Expiratory wheezes L Breath Sounds: Diminished R Breath Sounds: Diminished O2 Device: Nasal Cannula O2 Flow Rate (L/min): 5 L/min    R Recommendations: See Admitting Provider Note  Report given to:   Additional Notes:

## 2022-10-26 NOTE — Assessment & Plan Note (Addendum)
-   Continue scheduled DuoNebs, as needed albuterol - Continue O2 supplementation as needed - Continue steroid - Chest x-ray is not indicative of pneumonia - VBG shows a pH 7.4, pCO2 72-well compensated - Bicarb is 37 which is compensating for the CO2 retention - Negative COVID and flu - EKG shows a heart rate of 103, sinus tach, QTc 447 - Continue Breztri - Continue to monitor

## 2022-10-26 NOTE — Assessment & Plan Note (Signed)
Continue Protonix °

## 2022-10-26 NOTE — ED Provider Notes (Signed)
Pt signed out by Dr. Alvino Chapel pending symptomatic improvement.  Pt was feeling a little better, but is now sob again.  She did get up and go to the bathroom and felt very sob.  She does not think she can go home.  Another albuterol neb ordered.  Pt d/w DDr. Clearence Ped (triad) for admission.   Isla Pence, MD 10/26/22 1710

## 2022-10-26 NOTE — Assessment & Plan Note (Signed)
Continue Synthroid °

## 2022-10-26 NOTE — ED Triage Notes (Signed)
Pt arrives to ER form home with c/o increasing SHOB.  Pt states she can not catch her breath, reports chest pain.  Pt on 4L [place] at home. Pt with hx of COPD.

## 2022-10-26 NOTE — Assessment & Plan Note (Addendum)
-   3-1/2 L nasal cannula at baseline - Requiring 5 L nasal cannula here - VBG shows a pH of 7.4, pCO2 72-well compensated and likely near to baseline - Most likely secondary to COPD exacerbation - There was some concern for edema on chest x-ray but patient does not appear hypervolemic - Continue scheduled DuoNebs, as needed albuterol - Continue Breztri - Continue steroids - Wean down to baseline O2 as tolerated - CPAP for tonight - EKG shows sinus tachycardia with a heart rate of 103 and a QTc of 447, no sign of ST elevation - Troponin is 5 - Continue to monitor

## 2022-10-26 NOTE — H&P (Signed)
History and Physical    Patient: Gloria Calderon [id number] DOB: February 18, 1966 DOA: 10/26/2022 DOS: the patient was seen and examined on 10/26/2022 PCP: Jaynee Eagles, MD  Patient coming from: Home  Chief Complaint:  Chief Complaint  Patient presents with   Shortness of Breath   HPI: Gloria Calderon is a 57 y.o. female with medical history significant of anxiety, arthritis, chronic back pain, COPD, depression, fibromyalgia, GERD, hypothyroidism, and more presents the ED with a chief complaint of dyspnea.  Patient reports dyspnea has been on and off for couple of months.  She reports she is always manage to be it because she knows what to do and what not to do.  When asked what she does to "beat it" she reports that she does minor exercise and plays with her dog.  She reports her dyspnea is worse with exertion.  She uses an inhaler and sometimes it helps.  She does have some chest pains under her left breast radiating to her right chest and up to her right shoulder blade.  This pains are intermittent.  They are unpredictable with no provoking or relieving factors.  She has palpitations when the chest pain is present.  Patient reports she does have a cough but that does not worsen the chest pain.  The cough is productive of yellow sputum.  She has had a subjective fever but no measured fever.  She reports generalized weakness and fatigue.  Patient is on 3-1/2 L of oxygen per nasal cannula at home.  She reports that she trended up to 5 when she uses her portable tank.  She had nausea and vomiting x 1.  It has been nonbloody emesis.  She reports abdominal pain in her pelvis.  She admits to dysuria and reports that started about 2 months ago.  It looks like she had a UA a few days ago that was normal, will repeat UA now.  She denies hematuria, and diarrhea.  She reports she has chronic constipation.  Patient has no other complaints at this time.  Patient does smoke about half a pack per day.  She  only drinks on the holidays.  She does not use any illicit drugs.  She is vaccinated for COVID and flu. Review of Systems: As mentioned in the history of present illness. All other systems reviewed and are negative. Past Medical History:  Diagnosis Date   Anxiety    Arthritis    Asthma    Chronic back pain    Constipation 04/02/2017   COPD (chronic obstructive pulmonary disease) (HCC)    DDD (degenerative disc disease), lumbar    Depression    Dyspnea    Fibromyalgia    GERD (gastroesophageal reflux disease)    H/O seasonal allergies    Headache    Hypothyroidism    Osteoporosis    Pneumonia 2008   Ringing in ear, right    Sciatica    Spinal stenosis    Past Surgical History:  Procedure Laterality Date   APPENDECTOMY     CHOLECYSTECTOMY     COLONOSCOPY WITH PROPOFOL N/A 03/25/2019   Procedure: COLONOSCOPY WITH PROPOFOL;  Surgeon: Rogene Houston, MD;  [facility];  Service: Endoscopy;  Laterality: N/A;  11:05   LEFT HEART CATH AND CORONARY ANGIOGRAPHY N/A 06/17/2018   Procedure: LEFT HEART CATH AND CORONARY ANGIOGRAPHY;  Surgeon: Leonie Man, MD;  Location: [facility];  Service: Cardiovascular;  Laterality: N/A;   POLYPECTOMY  03/25/2019  Procedure: POLYPECTOMY;  Surgeon: Rogene Houston, MD;  Location: [facility];  Service: Endoscopy;;  Hepatic flexure, sigmoid colon   Social History:  reports that she has been smoking cigarettes and e-cigarettes. She started smoking about 47 years ago. She has a 110.00 pack-year smoking history. She has been exposed to tobacco smoke. She has never used smokeless tobacco. She reports current alcohol use of about 4.0 standard drinks of alcohol per week. She reports that she does not use drugs.  Allergies  Allergen Reactions   Isosorbide Nausea And Vomiting and Swelling    Severe headache   Levaquin [Levofloxacin] Hives   Morphine And Related Itching and Nausea And Vomiting    Family History  Problem Relation  Age of Onset   COPD Father    Diabetes Mellitus II Mother     Prior to Admission medications   Medication Sig Start Date End Date Taking? Authorizing Provider  albuterol (VENTOLIN HFA) 108 (90 Base) MCG/ACT inhaler 2 PUFFS EVERY 6 HOURS AS NEEDED FOR WHEEZING OR SHORTNESS OF BREATH Patient taking differently: Inhale 2 puffs into the lungs every 6 (six) hours as needed for wheezing or shortness of breath. 02/17/19  Yes Collene Gobble, MD  ALPRAZolam Duanne Moron) 1 MG tablet Take 1 tablet (1 mg total) by mouth 4 (four) times daily as needed for anxiety. Patient taking differently: Take 1 mg by mouth 3 (three) times daily as needed for anxiety. 01/12/18  Yes Collene Gobble, MD  Aspirin-Salicylamide-Caffeine (BC HEADACHE POWDER PO) Take 1 Package by mouth daily.   Yes [provider]  bisacodyl (DULCOLAX) 5 MG EC tablet Take 5 mg by mouth daily as needed for moderate constipation.    Yes [provider]  Budeson-Glycopyrrol-Formoterol (BREZTRI AEROSPHERE) 160-9-4.8 MCG/ACT AERO Inhale 2 puffs into the lungs in the morning and at bedtime. 04/15/22  Yes Collene Gobble, MD  Carboxymethylcellulose Sod PF 0.5 % SOLN Place 2 drops into both eyes 2 (two) times daily.   Yes [provider]  celecoxib (CELEBREX) 200 MG capsule Take 200 mg by mouth 2 (two) times daily. 09/18/20  Yes [provider]  COLLAGEN PO Take 1 Scoop by mouth daily at 6 (six) AM.   Yes [provider]  cyclobenzaprine (FLEXERIL) 10 MG tablet Take 30 mg by mouth 3 (three) times daily.    Yes [provider]  FLUoxetine (PROZAC) 20 MG capsule Take 40 mg by mouth daily. 06/04/22  Yes [provider]  fluticasone (FLONASE) 50 MCG/ACT nasal spray Place 2 sprays into both nostrils daily.   Yes [provider]  guaifenesin (ROBITUSSIN) 100 MG/5ML syrup Take 200 mg by mouth at bedtime as needed for cough.   Yes [provider]  ipratropium-albuterol (DUONEB) 0.5-2.5 (3)  MG/3ML SOLN NEBULIZE 1 VIAL EVERY 6 HOURS AS NEEDED FOR SHORTNESS OF BREATH Patient taking differently: Take 3 mLs by nebulization every 4 (four) hours as needed. 06/14/19  Yes Collene Gobble, MD  levothyroxine (SYNTHROID, LEVOTHROID) 25 MCG tablet Take 25 mcg by mouth daily before breakfast.  03/07/17  Yes [provider]  nicotine (NICODERM CQ - DOSED IN MG/24 HOURS) 14 mg/24hr patch Place 1 patch (14 mg total) onto the skin daily. 09/03/22  Yes Collene Gobble, MD  oxyCODONE (ROXICODONE) 15 MG immediate release tablet Take 15 mg by mouth in the morning, at noon, and at bedtime.   Yes [provider]  OXYGEN Inhale 3.5-4 L into the lungs continuous.   Yes  [provider]  pantoprazole (PROTONIX) 40 MG tablet Take 40 mg by mouth daily. 06/21/20  Yes [provider]  predniSONE (DELTASONE) 10 MG tablet TAKE ONE TABLET DAILY with BREAKFAST Patient taking differently: Take 10 mg by mouth daily with breakfast. 09/24/22  Yes Byrum, Rose Fillers, MD  pregabalin (LYRICA) 150 MG capsule Take 150 mg by mouth daily.   Yes [provider]  psyllium (METAMUCIL) 58.6 % powder Take 1 packet by mouth every other day.   Yes [provider]  QUEtiapine (SEROQUEL) 50 MG tablet Take 50 mg by mouth at bedtime.   Yes [provider]  Probiotic Product ([employer] ADV PROBIOTIC GUMMIES PO) Take 1 tablet by mouth daily. Patient not taking: Reported on 10/26/2022    [provider]  Spacer/Aero-Holding Chambers [facility]) [place] AS DIRECTED 09/12/21   Collene Gobble, MD    Physical Exam: Vitals:   10/26/22 1413 10/26/22 1415 10/26/22 1418 10/26/22 1822  BP: (!) 168/80   131/73  Pulse: 98   90  Resp: (!) 22   19  Temp:   98.3 F (36.8 C) 98.5 F (36.9 C)  TempSrc:    Oral  SpO2: 97%   97%  Weight:  49.4 kg    Height:  5' 4"$  (1.626 m)     1.  General: Patient lying supine in bed,  no acute distress   2.  Psychiatric: Somnolent but arousable and oriented x 3, mood and behavior normal for situation, pleasant and cooperative with exam   3. Neurologic: Speech and language are normal, face is symmetric, moves all 4 extremities voluntarily, at baseline without acute deficits on limited exam   4. HEENMT:  Head is atraumatic, normocephalic, pupils reactive to light, neck is supple, trachea is midline, mucous membranes are moist   5. Respiratory : Wheezing bilaterally without rhonchi, rales, no cyanosis, increased work of breathing   6. Cardiovascular : Heart rate normal, rhythm is regular, no murmurs, rubs or gallops, no peripheral edema, peripheral pulses palpated   7. Gastrointestinal:  Abdomen is soft, nondistended, nontender to palpation bowel sounds active, no masses or organomegaly palpated   8. Skin:  Skin is warm, dry and intact without rashes, acute lesions, or ulcers on limited exam   9.Musculoskeletal:  No acute deformities or trauma, no asymmetry in tone, no peripheral edema, peripheral pulses palpated, no tenderness to palpation in the extremities  Data Reviewed: In the ED Temp 98.3, heart rate 98, respiratory rate 22, blood pressure 168/80, satting 97% on 5 L No leukocytosis with a white blood cell count of 9.8, hemoglobin 13.5 Chemistries unremarkable Negative COVID and flu Chest x-ray shows bilateral interstitial thickening and small bilateral pleural effusions concerning for mild CHF EKG shows a heart rate of 103, sinus tach, QTc 447 Admission was requested for COPD exacerbation Assessment and Plan: Chronic pain - Continue Flexeril at a reduced dose and Lyrica  COPD exacerbation ([place]) - Continue scheduled DuoNebs, as needed albuterol - Continue O2 supplementation as needed - Continue steroid - Chest x-ray is not indicative of pneumonia - VBG shows a pH 7.4, pCO2 72-well compensated - Bicarb is 37 which is compensating for the CO2 retention - Negative COVID and  flu - EKG shows a heart rate of 103, sinus tach, QTc 447 - Continue Breztri - Continue to monitor  Acute on chronic respiratory failure with hypoxia and hypercapnia (HCC) - 3-1/2 L nasal cannula at baseline - Requiring 5 L nasal  cannula here - VBG shows a pH of 7.4, pCO2 72-well compensated and likely near to baseline - Most likely secondary to COPD exacerbation - There was some concern for edema on chest x-ray but patient does not appear hypervolemic - Continue scheduled DuoNebs, as needed albuterol - Continue Breztri - Continue steroids - Wean down to baseline O2 as tolerated - CPAP for tonight - EKG shows sinus tachycardia with a heart rate of 103 and a QTc of 447, no sign of ST elevation - Troponin is 5 - Continue to monitor  GERD (gastroesophageal reflux disease) - Continue Protonix  Hypothyroidism - Continue Synthroid  Anxiety disorder - Xanax and Prozac  Tobacco abuse - Continue nicotine patch      Advance Care Planning:   Code Status: Full Code  Consults: None at this time  Family Communication: No family at bedside  Severity of Illness: The appropriate patient status for this patient is OBSERVATION. Observation status is judged to be reasonable and necessary in order to provide the required intensity of service to ensure the patient's safety. The patient's presenting symptoms, physical exam findings, and initial radiographic and laboratory data in the context of their medical condition is felt to place them at decreased risk for further clinical deterioration. Furthermore, it is anticipated that the patient will be medically stable for discharge from the hospital within 2 midnights of admission.   Author: Rolla Plate, DO 10/26/2022 8:18 PM  For on call review [email or web address].

## 2022-10-26 NOTE — ED Provider Notes (Addendum)
[facility] Provider Note   CSN: [id number] Arrival date & time: 10/26/22  1347     History  Chief Complaint  Patient presents with   Shortness of Breath    Gloria Calderon is a 57 y.o. female.   Shortness of Breath  Patient with COPD on chronic oxygen.  On 4 to 5 L at baseline.  More short of breath somewhat since yesterday with particular since today.  Some chest tightness.  Has had a cough she states is little productive.  No fevers.  Patient denies known sick contacts reportedly patient's mother has had coughing recently also.  No swelling in her legs.   Past Medical History:  Diagnosis Date   Anxiety    Arthritis    Asthma    Chronic back pain    Constipation 04/02/2017   COPD (chronic obstructive pulmonary disease) (HCC)    DDD (degenerative disc disease), lumbar    Depression    Dyspnea    Fibromyalgia    GERD (gastroesophageal reflux disease)    H/O seasonal allergies    Headache    Hypothyroidism    Osteoporosis    Pneumonia 2008   Ringing in ear, right    Sciatica    Spinal stenosis     Home Medications Prior to Admission medications   Medication Sig Start Date End Date Taking? Authorizing Provider  albuterol (VENTOLIN HFA) 108 (90 Base) MCG/ACT inhaler 2 PUFFS EVERY 6 HOURS AS NEEDED FOR WHEEZING OR SHORTNESS OF BREATH Patient taking differently: Inhale 2 puffs into the lungs every 6 (six) hours as needed for wheezing or shortness of breath. 02/17/19   Collene Gobble, MD  ALPRAZolam Duanne Moron) 1 MG tablet Take 1 tablet (1 mg total) by mouth 4 (four) times daily as needed for anxiety. Patient taking differently: Take 1 mg by mouth 3 (three) times daily as needed for anxiety. 01/12/18   Collene Gobble, MD  Aspirin-Salicylamide-Caffeine (BC HEADACHE POWDER PO) Take 1 Package by mouth daily.    [provider]  bisacodyl (DULCOLAX) 5 MG EC tablet Take 5 mg by mouth daily as needed for moderate  constipation.     [provider]  Budeson-Glycopyrrol-Formoterol (BREZTRI AEROSPHERE) 160-9-4.8 MCG/ACT AERO Inhale 2 puffs into the lungs in the morning and at bedtime. 04/15/22   Collene Gobble, MD  Carboxymethylcellulose Sod PF 0.5 % SOLN Place 1 drop into both eyes 3 (three) times daily.     [provider]  celecoxib (CELEBREX) 200 MG capsule Take 200 mg by mouth 2 (two) times daily. 09/18/20   [provider]  Cholecalciferol (VITAMIN D) 125 MCG (5000 UT) CAPS Take 1,500 Units by mouth daily.    [provider]  COLLAGEN PO Take 1 tablet by mouth daily at 6 (six) AM.    [provider]  cyclobenzaprine (FLEXERIL) 10 MG tablet Take 30 mg by mouth 3 (three) times daily.     [provider]  FLUoxetine (PROZAC) 20 MG capsule Take 20 mg by mouth daily. 06/04/22   [provider]  fluticasone (FLONASE) 50 MCG/ACT nasal spray Place 2 sprays into both nostrils daily.    [provider]  guaifenesin (ROBITUSSIN) 100 MG/5ML syrup Take 200 mg by mouth at bedtime as needed for cough.    [provider]  ipratropium-albuterol (DUONEB) 0.5-2.5 (3) MG/3ML SOLN NEBULIZE 1 VIAL EVERY 6 HOURS AS NEEDED FOR SHORTNESS OF BREATH Patient taking differently: Take 3 mLs  by nebulization every 4 (four) hours as needed. 06/14/19   Collene Gobble, MD  levothyroxine (SYNTHROID, LEVOTHROID) 25 MCG tablet Take 25 mcg by mouth daily before breakfast.  03/07/17   [provider]  Multiple Vitamins-Minerals (HAIR SKIN NAILS PO) Take 1 tablet by mouth daily.    [provider]  nicotine (NICODERM CQ - DOSED IN MG/24 HOURS) 14 mg/24hr patch Place 1 patch (14 mg total) onto the skin daily. 09/03/22   Collene Gobble, MD  oxyCODONE (ROXICODONE) 15 MG immediate release tablet Take 15 mg by mouth in the morning, at noon, and at bedtime.    [provider]  OXYGEN Inhale 2.5-3 L into the lungs continuous.     [provider]  pantoprazole (PROTONIX) 40 MG tablet Take 40 mg by mouth daily. 06/21/20   [provider]  predniSONE (DELTASONE) 10 MG tablet TAKE ONE TABLET DAILY with BREAKFAST 09/24/22   Collene Gobble, MD  pregabalin (LYRICA) 150 MG capsule Take 300 mg by mouth daily.     [provider]  Probiotic Product ([employer] ADV PROBIOTIC GUMMIES PO) Take 1 tablet by mouth daily.    [provider]  psyllium (METAMUCIL) 58.6 % powder Take 1 packet by mouth daily.    [provider]  Spacer/Aero-Holding Chambers [facility]) MISC USE DAILY WITH INHALER AS DIRECTED 09/12/21   Collene Gobble, MD  varenicline (CHANTIX PAK) 0.5 MG X 11 & 1 MG X 42 tablet Take one 0.5 mg tablet by mouth once daily for 3 days, then increase to one 0.5 mg tablet twice daily for 4 days, then increase to one 1 mg tablet twice daily. 02/11/22   Cassandria Anger, RPH-CPP  varenicline (CHANTIX) 1 MG tablet Take 1 tablet (1 mg total) by mouth 2 (two) times daily. 02/11/22   Cassandria Anger, RPH-CPP      Allergies    Isosorbide, Levaquin [levofloxacin], and Morphine and related    Review of Systems   Review of Systems  Respiratory:  Positive for shortness of breath.     Physical Exam Updated Vital Signs BP (!) 168/80   Pulse 98   Temp 98.3 F (36.8 C)   Resp (!) 22   Ht 5' 4"$  (1.626 m)   Wt 49.4 kg   SpO2 97%   BMI 18.71 kg/m  Physical Exam Vitals and nursing note reviewed.  Cardiovascular:     Rate and Rhythm: Normal rate and regular rhythm.  Pulmonary:     Breath sounds: Decreased breath sounds present.     Comments: Diffuse wheezes with prolonged expirations. Chest:     Chest wall: No tenderness.  Musculoskeletal:     Right lower leg: No edema.     Left lower leg: No edema.  Neurological:     Mental Status: She is alert.     ED Results / Procedures / Treatments   Labs (all labs ordered are listed, but only abnormal results are displayed) Labs Reviewed  CBC WITH  DIFFERENTIAL/PLATELET - Abnormal; Notable for the following components:      Result Value   MCV 107.6 (*)    MCHC 29.8 (*)    Neutro Abs 8.8 (*)    Lymphs Abs 0.4 (*)    Abs Immature Granulocytes 0.08 (*)    All other components within normal limits  RESP PANEL BY RT-PCR (RSV, FLU A&B, COVID)  RVPGX2  COMPREHENSIVE METABOLIC PANEL    EKG None  Radiology No results  found.  Procedures Procedures    Medications Ordered in ED Medications  albuterol (PROVENTIL) (2.5 MG/3ML) 0.083% nebulizer solution 5 mg (5 mg Nebulization Given 10/26/22 1427)  ipratropium (ATROVENT) nebulizer solution 0.5 mg (0.5 mg Nebulization Given 10/26/22 1427)  methylPREDNISolone sodium succinate (SOLU-MEDROL) 125 mg/2 mL injection 125 mg (125 mg Intravenous Given 10/26/22 1430)    ED Course/ Medical Decision Making/ A&P                             Medical Decision Making Amount and/or Complexity of Data Reviewed Labs: ordered. Radiology: ordered.  Risk Prescription drug management.   Patient is shortness of breath and cough.  History of COPD on chronic oxygen.  Now more short of breath.  Dyspneic with wheezing.  Will get chest x-ray to evaluate for pneumonia.  Reviewed previous pulmonary note.  Will also give steroids and breathing treatment.  With some dyspnea likely require admission to hospital.  COVID and flu testing also pending.  Care turned over to Dr. Gilford Raid.  On reexam patient is feeling somewhat better.  Will ambulate in room to see how she tolerates some mild exertion.  Feels as if she may feel good enough to go home.        Final Clinical Impression(s) / ED Diagnoses Final diagnoses:  COPD exacerbation [facility])    Rx / DC Orders ED Discharge Orders     None         Davonna Belling, MD 10/26/22 1453    Davonna Belling, MD 10/26/22 1536

## 2022-10-26 NOTE — Assessment & Plan Note (Signed)
-   Continue Flexeril at a reduced dose and Lyrica

## 2022-10-26 NOTE — Assessment & Plan Note (Signed)
Continue nicotine patch  °

## 2022-10-27 ENCOUNTER — Other Ambulatory Visit (HOSPITAL_COMMUNITY): Payer: Self-pay | Admitting: *Deleted

## 2022-10-27 ENCOUNTER — Inpatient Hospital Stay (HOSPITAL_COMMUNITY): Payer: Medicare PPO

## 2022-10-27 DIAGNOSIS — E038 Other specified hypothyroidism: Secondary | ICD-10-CM | POA: Diagnosis not present

## 2022-10-27 DIAGNOSIS — K219 Gastro-esophageal reflux disease without esophagitis: Secondary | ICD-10-CM | POA: Diagnosis present

## 2022-10-27 DIAGNOSIS — K2100 Gastro-esophageal reflux disease with esophagitis, without bleeding: Secondary | ICD-10-CM

## 2022-10-27 DIAGNOSIS — M810 Age-related osteoporosis without current pathological fracture: Secondary | ICD-10-CM | POA: Diagnosis present

## 2022-10-27 DIAGNOSIS — Z91199 Patient's noncompliance with other medical treatment and regimen due to unspecified reason: Secondary | ICD-10-CM | POA: Diagnosis not present

## 2022-10-27 DIAGNOSIS — Z9981 Dependence on supplemental oxygen: Secondary | ICD-10-CM | POA: Diagnosis not present

## 2022-10-27 DIAGNOSIS — Z881 Allergy status to other antibiotic agents status: Secondary | ICD-10-CM | POA: Diagnosis not present

## 2022-10-27 DIAGNOSIS — R0609 Other forms of dyspnea: Secondary | ICD-10-CM | POA: Diagnosis not present

## 2022-10-27 DIAGNOSIS — Z79899 Other long term (current) drug therapy: Secondary | ICD-10-CM | POA: Diagnosis not present

## 2022-10-27 DIAGNOSIS — G8929 Other chronic pain: Secondary | ICD-10-CM | POA: Diagnosis present

## 2022-10-27 DIAGNOSIS — J441 Chronic obstructive pulmonary disease with (acute) exacerbation: Secondary | ICD-10-CM | POA: Diagnosis present

## 2022-10-27 DIAGNOSIS — K5909 Other constipation: Secondary | ICD-10-CM | POA: Diagnosis present

## 2022-10-27 DIAGNOSIS — F17210 Nicotine dependence, cigarettes, uncomplicated: Secondary | ICD-10-CM | POA: Diagnosis present

## 2022-10-27 DIAGNOSIS — Z1152 Encounter for screening for COVID-19: Secondary | ICD-10-CM | POA: Diagnosis not present

## 2022-10-27 DIAGNOSIS — F419 Anxiety disorder, unspecified: Secondary | ICD-10-CM | POA: Diagnosis present

## 2022-10-27 DIAGNOSIS — I5033 Acute on chronic diastolic (congestive) heart failure: Secondary | ICD-10-CM | POA: Diagnosis present

## 2022-10-27 DIAGNOSIS — Z833 Family history of diabetes mellitus: Secondary | ICD-10-CM | POA: Diagnosis not present

## 2022-10-27 DIAGNOSIS — Z7951 Long term (current) use of inhaled steroids: Secondary | ICD-10-CM | POA: Diagnosis not present

## 2022-10-27 DIAGNOSIS — J9622 Acute and chronic respiratory failure with hypercapnia: Secondary | ICD-10-CM | POA: Diagnosis present

## 2022-10-27 DIAGNOSIS — Z888 Allergy status to other drugs, medicaments and biological substances status: Secondary | ICD-10-CM | POA: Diagnosis not present

## 2022-10-27 DIAGNOSIS — Z885 Allergy status to narcotic agent status: Secondary | ICD-10-CM | POA: Diagnosis not present

## 2022-10-27 DIAGNOSIS — Z825 Family history of asthma and other chronic lower respiratory diseases: Secondary | ICD-10-CM | POA: Diagnosis not present

## 2022-10-27 DIAGNOSIS — M797 Fibromyalgia: Secondary | ICD-10-CM | POA: Diagnosis present

## 2022-10-27 DIAGNOSIS — F4010 Social phobia, unspecified: Secondary | ICD-10-CM | POA: Diagnosis not present

## 2022-10-27 DIAGNOSIS — E039 Hypothyroidism, unspecified: Secondary | ICD-10-CM | POA: Diagnosis present

## 2022-10-27 DIAGNOSIS — J9621 Acute and chronic respiratory failure with hypoxia: Secondary | ICD-10-CM | POA: Diagnosis present

## 2022-10-27 LAB — COMPREHENSIVE METABOLIC PANEL
ALT: 27 U/L (ref 0–44)
AST: 22 U/L (ref 15–41)
Albumin: 3.3 g/dL — ABNORMAL LOW (ref 3.5–5.0)
Alkaline Phosphatase: 70 U/L (ref 38–126)
Anion gap: 10 (ref 5–15)
BUN: 14 mg/dL (ref 6–20)
CO2: 37 mmol/L — ABNORMAL HIGH (ref 22–32)
Calcium: 8.5 mg/dL — ABNORMAL LOW (ref 8.9–10.3)
Chloride: 90 mmol/L — ABNORMAL LOW (ref 98–111)
Creatinine, Ser: 0.38 mg/dL — ABNORMAL LOW (ref 0.44–1.00)
GFR, Estimated: >60 mL/min (ref >60)
Glucose, Bld: 106 mg/dL — ABNORMAL HIGH (ref 70–99)
Potassium: 4.1 mmol/L (ref 3.5–5.1)
Sodium: 137 mmol/L (ref 135–145)
Total Bilirubin: 0.5 mg/dL (ref 0.3–1.2)
Total Protein: 6.3 g/dL — ABNORMAL LOW (ref 6.5–8.1)

## 2022-10-27 LAB — CBC WITH DIFFERENTIAL/PLATELET
Abs Immature Granulocytes: 0.04 10*3/uL (ref 0.00–0.07)
Basophils Absolute: 0 10*3/uL (ref 0.0–0.1)
Basophils Relative: 0 %
Eosinophils Absolute: 0 10*3/uL (ref 0.0–0.5)
Eosinophils Relative: 0 %
HCT: 38.9 % (ref 36.0–46.0)
Hemoglobin: 11.8 g/dL — ABNORMAL LOW (ref 12.0–15.0)
Immature Granulocytes: 1 %
Lymphocytes Relative: 8 %
Lymphs Abs: 0.4 10*3/uL — ABNORMAL LOW (ref 0.7–4.0)
MCH: 31.6 pg (ref 26.0–34.0)
MCHC: 30.3 g/dL (ref 30.0–36.0)
MCV: 104.3 fL — ABNORMAL HIGH (ref 80.0–100.0)
Monocytes Absolute: 0.4 10*3/uL (ref 0.1–1.0)
Monocytes Relative: 7 %
Neutro Abs: 4.2 10*3/uL (ref 1.7–7.7)
Neutrophils Relative %: 84 %
Platelets: 246 10*3/uL (ref 150–400)
RBC: 3.73 MIL/uL — ABNORMAL LOW (ref 3.87–5.11)
RDW: 12.5 % (ref 11.5–15.5)
WBC: 5 10*3/uL (ref 4.0–10.5)
nRBC: 0 % (ref 0.0–0.2)

## 2022-10-27 LAB — TSH: TSH: 0.051 u[IU]/mL — ABNORMAL LOW (ref 0.350–4.500)

## 2022-10-27 LAB — MAGNESIUM: Magnesium: 2 mg/dL (ref 1.7–2.4)

## 2022-10-27 LAB — HIV ANTIBODY (ROUTINE TESTING W REFLEX): HIV Screen 4th Generation wRfx: NONREACTIVE

## 2022-10-27 MED ORDER — DOXYCYCLINE HYCLATE 100 MG PO TABS
100.0000 mg | ORAL_TABLET | Freq: Two times a day (BID) | ORAL | Status: DC
  Administered 2022-10-27 – 2022-10-28 (×3): 100 mg via ORAL
  Filled 2022-10-27 (×3): qty 1

## 2022-10-27 MED ORDER — METHYLPREDNISOLONE SODIUM SUCC 40 MG IJ SOLR
40.0000 mg | Freq: Two times a day (BID) | INTRAMUSCULAR | Status: DC
  Administered 2022-10-27 – 2022-10-28 (×3): 40 mg via INTRAVENOUS
  Filled 2022-10-27 (×3): qty 1

## 2022-10-27 MED ORDER — DM-GUAIFENESIN ER 30-600 MG PO TB12
1.0000 | ORAL_TABLET | Freq: Two times a day (BID) | ORAL | Status: DC
  Administered 2022-10-27 – 2022-10-28 (×3): 1 via ORAL
  Filled 2022-10-27 (×3): qty 1

## 2022-10-27 MED ORDER — FUROSEMIDE 10 MG/ML IJ SOLN
20.0000 mg | Freq: Two times a day (BID) | INTRAMUSCULAR | Status: DC
  Administered 2022-10-27 – 2022-10-28 (×3): 20 mg via INTRAVENOUS
  Filled 2022-10-27 (×3): qty 2

## 2022-10-27 NOTE — Plan of Care (Signed)
  Problem: Education: Goal: Knowledge of General Education information will improve Description Including pain rating scale, medication(s)/side effects and non-pharmacologic comfort measures Outcome: Progressing   

## 2022-10-27 NOTE — TOC Progression Note (Signed)
  Transition of Care [facility]) Screening Note   Patient Details  Name: Gloria Calderon Date of Birth: Apr 25, 1966   Transition of Care [facility]) CM/SW Contact:    Shade Flood, LCSW Phone Number: 10/27/2022, 9:37 AM    Transition of Care Department [facility]) has reviewed patient and no TOC needs have been identified at this time. We will continue to monitor patient advancement through interdisciplinary progression rounds. If new patient transition needs arise, please place a TOC consult.

## 2022-10-27 NOTE — Progress Notes (Signed)
PROGRESS NOTE     Gloria Calderon, is a 57 y.o. female, DOB - 1966-05-06, [id number]  Admit date - 10/26/2022   Admitting Physician Devante Capano Denton Brick, MD  Outpatient Primary MD for the patient is Jaynee Eagles, MD  LOS - 0  Chief Complaint  Patient presents with   Shortness of Breath      Brief Narrative:  57 y.o. female with medical history significant of anxiety, arthritis, chronic back pain, COPD, depression, fibromyalgia, GERD, hypothyroidism continued on 10/26/2022 with acute on chronic hypoxic respiratory failure secondary to COPD exacerbation    -Assessment and Plan:  1)Acute COPD exacerbation - -flu COVID and RSV negative  -Chest x-ray without pneumonia  -Treat with doxycycline, IV Solu-Medrol mucolytics and bronchodilators  -- Bicarb is 37 which is compensating for the presumed chronic CO2 retention -  2) possible CHF--chest x-ray findings noted with possible CHF -LHC from 07/04/2018 without obstructive CAD, with EF of 55 to 65% -- EKG shows sinus tachycardia with a heart rate of 103 and a QTc of 447, no sign of ST elevation - Troponin is 5 -Give IV Lasix -Echo pending -Daily weight and fluid output and input monitoring  3)Acute on chronic respiratory failure with hypoxia and hypercapnia (HCC) - 3-1/2 L nasal cannula at baseline - Currently requiring 6 L nasal cannula -Bicarb is 37 which is compensating for the presumed chronic CO2 retention -Management as above #1 and #2  4)GERD (gastroesophageal reflux disease) - Continue Protonix especially while on steroids  5)Hypothyroidism - Continue Synthroid -Check TSH  6)Anxiety disorder - Xanax and Prozac  7)Tobacco abuse - Smoking cessation advised, continue nicotine patch  8)Chronic pain - Continue PTA Flexeril at a reduced dose and Lyrica   Status is: Inpatient   Disposition: The patient is from: Home              Anticipated d/c is to: Home              Anticipated d/c date is: 2 days               Patient currently is not medically stable to d/c. Barriers: Not Clinically Stable-   Code Status :  -  Code Status: Full Code   Family Communication:    NA (patient is alert, awake and coherent)   DVT Prophylaxis  :   - SCDs  heparin injection 5,000 Units Start: 10/26/22 2200 SCDs Start: 10/26/22 1818   Lab Results  Component Value Date   PLT 246 10/27/2022    Inpatient Medications  Scheduled Meds:  cyclobenzaprine  10 mg Oral TID   FLUoxetine  40 mg Oral Daily   heparin  5,000 Units Subcutaneous Q8H   ipratropium-albuterol  3 mL Nebulization Q6H   levothyroxine  25 mcg Oral QAC breakfast   methylPREDNISolone (SOLU-MEDROL) injection  125 mg Intravenous Q12H   Followed by   Derrill Memo ON 10/28/2022] predniSONE  40 mg Oral Q breakfast   mometasone-formoterol  2 puff Inhalation BID   And   umeclidinium bromide  1 puff Inhalation Daily   nicotine  14 mg Transdermal Daily   pantoprazole  40 mg Oral Daily   pregabalin  150 mg Oral Daily   QUEtiapine  50 mg Oral QHS   Continuous Infusions: PRN Meds:.acetaminophen **OR** acetaminophen, albuterol, ALPRAZolam, guaiFENesin, ondansetron **OR** ondansetron (ZOFRAN) IV, oxyCODONE, oxyCODONE   Anti-infectives (From admission, onward)    None         Subjective: Gloria Calderon today has no fevers,  no emesis,  No chest pain,   - Cough and shortness of breath persist -Becomes very dyspneic with activity =-No leg pains or pleuritic symptoms -Oxygen requirement continues to be above baseline   Objective: Vitals:   10/27/22 0643 10/27/22 0723 10/27/22 0820 10/27/22 0846  BP: 126/62  (!) 116/32   Pulse: 88  82   Resp: 19  18   Temp: 97.8 F (36.6 C)  97.7 F (36.5 C)   TempSrc: Oral  Oral   SpO2: 99% 99% 100% 100%  Weight:      Height:       No intake or output data in the 24 hours ending 10/27/22 1037 Filed Weights   10/26/22 1415  Weight: 49.4 kg    Physical Exam  Gen:- Awake Alert, conversational  dyspnea HEENT:- [place].AT, No sclera icterus Nose- [place] 6L/min Neck-Supple Neck,No JVD,.  Lungs-diminished breath sounds few scattered wheezes bilaterally  CV- S1, S2 normal, regular  Abd-  +ve B.Sounds, Abd Soft, No tenderness,    Extremity/Skin:- No  edema, pedal pulses present  Psych-affect is appropriate, oriented x3 Neuro-no new focal deficits, no tremors  Data Reviewed: I have personally reviewed following labs and imaging studies  CBC: Recent Labs  Lab 10/26/22 1425 10/27/22 0627  WBC 9.8 5.0  NEUTROABS 8.8* 4.2  HGB 13.5 11.8*  HCT 45.3 38.9  MCV 107.6* 104.3*  PLT 252 0000000   Basic Metabolic Panel: Recent Labs  Lab 10/26/22 1425 10/27/22 0627  NA 139 137  K 4.7 4.1  CL 91* 90*  CO2 37* 37*  GLUCOSE 119* 106*  BUN 14 14  CREATININE 0.54 0.38*  CALCIUM 8.6* 8.5*  MG  --  2.0   GFR: Estimated Creatinine Clearance: 60.5 mL/min (A) (by C-G formula based on SCr of 0.38 mg/dL (L)). Liver Function Tests: Recent Labs  Lab 10/26/22 1425 10/27/22 0627  AST 33 22  ALT 28 27  ALKPHOS 85 70  BILITOT 0.6 0.5  PROT 7.7 6.3*  ALBUMIN 3.9 3.3*   Recent Results (from the past 240 hour(s))  Resp panel by RT-PCR (RSV, Flu A&B, Covid) Anterior Nasal Swab     Status: None   Collection Time: 10/26/22  2:37 PM   Specimen: Anterior Nasal Swab  Result Value Ref Range Status   SARS Coronavirus 2 by RT PCR NEGATIVE NEGATIVE Final    Comment: (NOTE) SARS-CoV-2 target nucleic acids are NOT DETECTED.  The SARS-CoV-2 RNA is generally detectable in upper respiratory specimens during the acute phase of infection. The lowest concentration of SARS-CoV-2 viral copies this assay can detect is 138 copies/mL. A negative result does not preclude SARS-Cov-2 infection and should not be used as the sole basis for treatment or other patient management decisions. A negative result may occur with  improper specimen collection/handling, submission of specimen other than nasopharyngeal swab,  presence of viral mutation(s) within the areas targeted by this assay, and inadequate number of viral copies(<138 copies/mL). A negative result must be combined with clinical observations, patient history, and epidemiological information. The expected result is Negative.  Fact Sheet for Patients:  EntrepreneurPulse.com.au  Fact Sheet for Healthcare Providers:  IncredibleEmployment.be  This test is no t yet approved or cleared by the [place] FDA and  has been authorized for detection and/or diagnosis of SARS-CoV-2 by FDA under an Emergency Use Authorization (EUA). This EUA will remain  in effect (meaning this test can be used) for the duration of the COVID-19 declaration under Section 564(b)(1) of the Act,  21 U.S.C.section 360bbb-3(b)(1), unless the authorization is terminated  or revoked sooner.       Influenza A by PCR NEGATIVE NEGATIVE Final   Influenza B by PCR NEGATIVE NEGATIVE Final    Comment: (NOTE) The Xpert Xpress SARS-CoV-2/FLU/RSV plus assay is intended as an aid in the diagnosis of influenza from Nasopharyngeal swab specimens and should not be used as a sole basis for treatment. Nasal washings and aspirates are unacceptable for Xpert Xpress SARS-CoV-2/FLU/RSV testing.  Fact Sheet for Patients: EntrepreneurPulse.com.au  Fact Sheet for Healthcare Providers: IncredibleEmployment.be  This test is not yet approved or cleared by the [place] FDA and has been authorized for detection and/or diagnosis of SARS-CoV-2 by FDA under an Emergency Use Authorization (EUA). This EUA will remain in effect (meaning this test can be used) for the duration of the COVID-19 declaration under Section 564(b)(1) of the Act, 21 U.S.C. section 360bbb-3(b)(1), unless the authorization is terminated or revoked.     Resp Syncytial Virus by PCR NEGATIVE NEGATIVE Final    Comment: (NOTE) Fact Sheet for  Patients: EntrepreneurPulse.com.au  Fact Sheet for Healthcare Providers: IncredibleEmployment.be  This test is not yet approved or cleared by the [place] FDA and has been authorized for detection and/or diagnosis of SARS-CoV-2 by FDA under an Emergency Use Authorization (EUA). This EUA will remain in effect (meaning this test can be used) for the duration of the COVID-19 declaration under Section 564(b)(1) of the Act, 21 U.S.C. section 360bbb-3(b)(1), unless the authorization is terminated or revoked.  Performed at [facility], [address] [place], [place] [address]     Radiology Studies: DG Chest Portable 1 View  Result Date: 10/26/2022 CLINICAL DATA:  Some chest tightness. Has had a cough she states is little productive. EXAM: PORTABLE CHEST 1 VIEW COMPARISON:  CT chest 10/02/2022, CT chest 02/07/2022 FINDINGS: Bilateral small pleural effusions. Bilateral mild interstitial thickening. Left suprahilar band like opacity unchanged compared with the prior CT dated 02/07/2022. Lungs are hyperinflated as can be seen with COPD. No focal consolidation. No pneumothorax. Stable cardiomediastinal silhouette. No acute osseous abnormality. IMPRESSION: 1. Bilateral interstitial thickening and bilateral small pleural effusions concerning for mild CHF. 2. COPD. Electronically Signed   By: Kathreen Devoid M.D.   On: 10/26/2022 15:12    Scheduled Meds:  cyclobenzaprine  10 mg Oral TID   FLUoxetine  40 mg Oral Daily   heparin  5,000 Units Subcutaneous Q8H   ipratropium-albuterol  3 mL Nebulization Q6H   levothyroxine  25 mcg Oral QAC breakfast   methylPREDNISolone (SOLU-MEDROL) injection  125 mg Intravenous Q12H   Followed by   Derrill Memo ON 10/28/2022] predniSONE  40 mg Oral Q breakfast   mometasone-formoterol  2 puff Inhalation BID   And   umeclidinium bromide  1 puff Inhalation Daily   nicotine  14 mg Transdermal Daily   pantoprazole  40 mg Oral Daily    pregabalin  150 mg Oral Daily   QUEtiapine  50 mg Oral QHS   Continuous Infusions:   LOS: 0 days   Roxan Hockey M.D on 10/27/2022 at 10:37 AM  Go to [email or web address] - for contact info  [facility] - Office  [phone]  If 7PM-7AM, please contact night-coverage [email or web address] 10/27/2022, 10:37 AM

## 2022-10-27 NOTE — Progress Notes (Signed)
2-D Echocardiogram not completed. Patient uncooperative. Would only lay on right somewhat prone side position.                            Alvino Chapel, RCS

## 2022-10-28 ENCOUNTER — Inpatient Hospital Stay (HOSPITAL_COMMUNITY): Payer: Medicare PPO

## 2022-10-28 DIAGNOSIS — F4010 Social phobia, unspecified: Secondary | ICD-10-CM | POA: Diagnosis not present

## 2022-10-28 DIAGNOSIS — J441 Chronic obstructive pulmonary disease with (acute) exacerbation: Secondary | ICD-10-CM | POA: Diagnosis not present

## 2022-10-28 DIAGNOSIS — J9621 Acute and chronic respiratory failure with hypoxia: Secondary | ICD-10-CM | POA: Diagnosis not present

## 2022-10-28 DIAGNOSIS — E038 Other specified hypothyroidism: Secondary | ICD-10-CM | POA: Diagnosis not present

## 2022-10-28 DIAGNOSIS — R0609 Other forms of dyspnea: Secondary | ICD-10-CM | POA: Diagnosis not present

## 2022-10-28 LAB — BASIC METABOLIC PANEL
Anion gap: 10 (ref 5–15)
BUN: 24 mg/dL — ABNORMAL HIGH (ref 6–20)
CO2: 42 mmol/L — ABNORMAL HIGH (ref 22–32)
Calcium: 9.1 mg/dL (ref 8.9–10.3)
Chloride: 87 mmol/L — ABNORMAL LOW (ref 98–111)
Creatinine, Ser: 0.6 mg/dL (ref 0.44–1.00)
GFR, Estimated: >60 mL/min (ref >60)
Glucose, Bld: 102 mg/dL — ABNORMAL HIGH (ref 70–99)
Potassium: 3.8 mmol/L (ref 3.5–5.1)
Sodium: 139 mmol/L (ref 135–145)

## 2022-10-28 LAB — ECHOCARDIOGRAM COMPLETE
Area-P 1/2: 6.96 cm2
Height: 64 in
S' Lateral: 3.6 cm
Weight: 1646.4 oz

## 2022-10-28 MED ORDER — ALBUTEROL SULFATE HFA 108 (90 BASE) MCG/ACT IN AERS: 2.0000 | INHALATION_SPRAY | Freq: Four times a day (QID) | RESPIRATORY_TRACT | 2 refills | Status: AC | PRN

## 2022-10-28 MED ORDER — DM-GUAIFENESIN ER 30-600 MG PO TB12: 1.0000 | ORAL_TABLET | Freq: Two times a day (BID) | ORAL | 1 refills | Status: AC

## 2022-10-28 MED ORDER — FUROSEMIDE 20 MG PO TABS: 20.0000 mg | ORAL_TABLET | Freq: Every day | ORAL | 0 refills | Status: AC

## 2022-10-28 MED ORDER — DOXYCYCLINE HYCLATE 100 MG PO TABS: 100.0000 mg | ORAL_TABLET | Freq: Two times a day (BID) | ORAL | 0 refills | Status: AC

## 2022-10-28 MED ORDER — PANTOPRAZOLE SODIUM 40 MG PO TBEC: 40.0000 mg | DELAYED_RELEASE_TABLET | Freq: Every day | ORAL | 3 refills | Status: AC

## 2022-10-28 MED ORDER — PREDNISONE 10 MG PO TABS: 10.0000 mg | ORAL_TABLET | Freq: Every day | ORAL | 4 refills | Status: DC

## 2022-10-28 MED ORDER — PREDNISONE 20 MG PO TABS: 40.0000 mg | ORAL_TABLET | Freq: Every day | ORAL | 0 refills | Status: AC

## 2022-10-28 MED ORDER — NICOTINE 14 MG/24HR TD PT24: 14.0000 mg | MEDICATED_PATCH | Freq: Every day | TRANSDERMAL | 0 refills | Status: AC

## 2022-10-28 MED ORDER — IPRATROPIUM-ALBUTEROL 0.5-2.5 (3) MG/3ML IN SOLN: 3.0000 mL | RESPIRATORY_TRACT | 3 refills | Status: AC | PRN

## 2022-10-28 NOTE — Care Management Important Message (Signed)
Important Message  Patient Details  Name: Gloria Calderon MRN: [id number] Date of Birth: [birth year]   Medicare Important Message Given:  N/A - LOS <3 / Initial given by admissions     Tommy Medal 10/28/2022, 10:49 AM

## 2022-10-28 NOTE — Discharge Summary (Signed)
Gloria Calderon, is a 57 y.o. female  DOB 01/20/1966  MRN [id number].  Admission date:  10/26/2022  Admitting Physician  Roxan Hockey, MD  Discharge Date:  10/28/2022   Primary MD  Jaynee Eagles, MD  Recommendations for primary care physician for things to follow:   1)Complete Abstinence from tobacco advised- 2)you need oxygen at home at 3.5 L via nasal cannula continuously while awake and while asleep--- smoking or having open fires around oxygen can cause fire, significant injury and death 3)follow up with Jaynee Eagles, MD (primary care physician) in 1 week for recheck and evaluation) 4)Very low-salt diet advised--less than 2 g of sodium per day advised 5)Weigh yourself daily, call if you gain more than 3 pounds in 1 day or more than 5 pounds in 1 week as your diuretic medications may need to be adjusted 6) your thyroid may be overactive--- stop taking thyroid medicine (levothyroxine/Synthroid), Repeat TSH test in 4 to 6 weeks  Admission Diagnosis  COPD exacerbation ([place]) [J44.1] COPD with acute exacerbation ([place]) [J44.1]   Discharge Diagnosis  COPD exacerbation ([place]) [J44.1] COPD with acute exacerbation ([place]) [J44.1]    Active Problems:   Tobacco abuse   Anxiety disorder   Hypothyroidism   GERD (gastroesophageal reflux disease)   Acute on chronic respiratory failure with hypoxia and hypercapnia (HCC)   COPD exacerbation (HCC)   Chronic pain   COPD with acute exacerbation ([place])      Past Medical History:  Diagnosis Date   Anxiety    Arthritis    Asthma    Chronic back pain    Constipation 04/02/2017   COPD (chronic obstructive pulmonary disease) (HCC)    DDD (degenerative disc disease), lumbar    Depression    Dyspnea    Fibromyalgia    GERD (gastroesophageal reflux disease)    H/O seasonal allergies    Headache    Hypothyroidism    Osteoporosis    Pneumonia 2008   Ringing in  ear, right    Sciatica    Spinal stenosis     Past Surgical History:  Procedure Laterality Date   APPENDECTOMY     CHOLECYSTECTOMY     COLONOSCOPY WITH PROPOFOL N/A 03/25/2019   Procedure: COLONOSCOPY WITH PROPOFOL;  Surgeon: Rogene Houston, MD;  [facility];  Service: Endoscopy;  Laterality: N/A;  11:05   LEFT HEART CATH AND CORONARY ANGIOGRAPHY N/A 06/17/2018   Procedure: LEFT HEART CATH AND CORONARY ANGIOGRAPHY;  Surgeon: Leonie Man, MD;  Location: [facility];  Service: Cardiovascular;  Laterality: N/A;   POLYPECTOMY  03/25/2019   Procedure: POLYPECTOMY;  Surgeon: Rogene Houston, MD;  [facility]: [facility];  Service: Endoscopy;;  Hepatic flexure, sigmoid colon     HPI  from the history and physical done on the day of admission:   HPI: Gloria Calderon is a 57 y.o. female with medical history significant of anxiety, arthritis, chronic back pain, COPD, depression, fibromyalgia, GERD, hypothyroidism, and more presents the ED with  a chief complaint of dyspnea.  Patient reports dyspnea has been on and off for couple of months.  She reports she is always manage to be it because she knows what to do and what not to do.  When asked what she does to "beat it" she reports that she does minor exercise and plays with her dog.  She reports her dyspnea is worse with exertion.  She uses an inhaler and sometimes it helps.  She does have some chest pains under her left breast radiating to her right chest and up to her right shoulder blade.  This pains are intermittent.  They are unpredictable with no provoking or relieving factors.  She has palpitations when the chest pain is present.  Patient reports she does have a cough but that does not worsen the chest pain.  The cough is productive of yellow sputum.  She has had a subjective fever but no measured fever.  She reports generalized weakness and fatigue.  Patient is on 3-1/2 L of oxygen per nasal cannula at home.  She reports  that she trended up to 5 when she uses her portable tank.  She had nausea and vomiting x 1.  It has been nonbloody emesis.  She reports abdominal pain in her pelvis.  She admits to dysuria and reports that started about 2 months ago.  It looks like she had a UA a few days ago that was normal, will repeat UA now.  She denies hematuria, and diarrhea.  She reports she has chronic constipation.  Patient has no other complaints at this time.   Patient does smoke about half a pack per day.  She only drinks on the holidays.  She does not use any illicit drugs.  She is vaccinated for COVID and flu. Review of Systems: As mentioned in the history of present illness. All other systems reviewed and are negative.   Hospital Course:   Brief Narrative:  57 y.o. female with medical history significant of anxiety, arthritis, chronic back pain, COPD, depression, fibromyalgia, GERD, hypothyroidism continued on 10/26/2022 with acute on chronic hypoxic respiratory failure secondary to COPD exacerbation     -Assessment and Plan:   1)Acute COPD exacerbation - -flu , COVID and RSV negative  -Chest x-ray without pneumonia  -Treated with doxycycline, IV Solu-Medrol mucolytics and bronchodilators  -- Bicarb is 37 which is compensating for the presumed chronic CO2 retention -Respiratory status improved significantly -Oxygen requirement back to baseline even with ambulation -Okay to discharge on p.o. prednisone, doxycycline mucolytics and bronchodilators -Smoking cessation strongly advised   2)HFpEF CHF--chest x-ray findings noted with possible CHF -LHC from 07/04/2018 without obstructive CAD, with EF of 55 to 65% -- EKG shows sinus tachycardia with a heart rate of 103 and a QTc of 447, no sign of ST elevation - Troponin is 5 -Patient was not very cooperative with echo tech -She refused echocardiogram on 10/27/2022 -She agreed to have echocardiogram done on 10/28/2022 patient was not very cooperative with positioning  show echo acoustic windows were not very good -EF is 50-55 patient with grade 1 diastolic dysfunction, no regional wall motion abnormalities --Treated with IV Lasix Okay to discharge on oral Lasix -Low-salt diet advised   3)Acute on chronic respiratory failure with hypoxia and hypercapnia (HCC) - 3-1/2 L nasal cannula at baseline Bicarb is 37 which is compensating for the presumed chronic CO2 retention -Oxygen requirement is back to baseline -Patient admits to noncompliance with oxygen at home due to the need to  smoke -Management as above #1 and #2   4)GERD (gastroesophageal reflux disease) - Continue Protonix especially while on steroids   5)Hypothyroidism -TSH is low at 0.051 -Stop levothyroxine and recheck TSH in 4 to 6 weeks   6)Anxiety disorder - Xanax and Prozac   7)Tobacco abuse - Smoking cessation advised, continue nicotine patch   8)Chronic pain - Continue PTA Flexeril at a reduced dose and Lyrica   Disposition: The patient is from: Home              Anticipated d/c is to: Home with home health physical therapy  Discharge Condition: stable  Follow UP   Follow-up Information     [facility].   Specialty: Home Health Services Why: [facility] staff will call you to schedule in home PT visits Contact information: [place] [address] [phone]                 Diet and Activity recommendation:  As advised  Discharge Instructions   Discharge Instructions     Ambulatory Referral for Lung Cancer Scre   Complete by: As directed    Call MD for:  difficulty breathing, headache or visual disturbances   Complete by: As directed    Call MD for:  persistant dizziness or light-headedness   Complete by: As directed    Call MD for:  persistant nausea and vomiting   Complete by: As directed    Call MD for:  temperature >100.4   Complete by: As directed    Diet - low sodium heart healthy   Complete  by: As directed    Discharge instructions   Complete by: As directed    1) complete abstinence from tobacco advised- 2)you need oxygen at home at 3.5 L via nasal cannula continuously while awake and while asleep--- smoking or having open fires around oxygen can cause fire, significant injury and death 3)follow up with Jaynee Eagles, MD (primary care physician) in 1 week for recheck and evaluation) 4)Very low-salt diet advised--less than 2 g of sodium per day advised 5)Weigh yourself daily, call if you gain more than 3 pounds in 1 day or more than 5 pounds in 1 week as your diuretic medications may need to be adjusted 6) your thyroid may be overactive--- stop taking thyroid medicine (levothyroxine/Synthroid), Repeat TSH test in 4 to 6 weeks   Increase activity slowly   Complete by: As directed        Discharge Medications     Allergies as of 10/28/2022       Reactions   Isosorbide Nausea And Vomiting, Swelling   Severe headache   Levaquin [levofloxacin] Hives   Morphine And Related Itching, Nausea And Vomiting        Medication List     STOP taking these medications    BC HEADACHE POWDER PO   [employer] ADV PROBIOTIC GUMMIES PO   levothyroxine 25 MCG tablet Commonly known as: SYNTHROID       TAKE these medications    albuterol 108 (90 Base) MCG/ACT inhaler Commonly known as: VENTOLIN HFA Inhale 2 puffs into the lungs every 6 (six) hours as needed for wheezing or shortness of breath.   ALPRAZolam 1 MG tablet Commonly known as: XANAX Take 1 tablet (1 mg total) by mouth 4 (four) times daily as needed for anxiety. What changed: when to take this   bisacodyl 5 MG EC tablet Commonly known as: DULCOLAX Take  5 mg by mouth daily as needed for moderate constipation.   Breztri Aerosphere 160-9-4.8 MCG/ACT Aero Generic drug: Budeson-Glycopyrrol-Formoterol Inhale 2 puffs into the lungs in the morning and at bedtime.   Carboxymethylcellulose Sod PF 0.5 % Soln Place 2 drops  into both eyes 2 (two) times daily.   celecoxib 200 MG capsule Commonly known as: CELEBREX Take 200 mg by mouth 2 (two) times daily.   COLLAGEN PO Take 1 Scoop by mouth daily at 6 (six) AM.   cyclobenzaprine 10 MG tablet Commonly known as: FLEXERIL Take 30 mg by mouth 3 (three) times daily.   dextromethorphan-guaiFENesin 30-600 MG 12hr tablet Commonly known as: MUCINEX DM Take 1 tablet by mouth 2 (two) times daily.   doxycycline 100 MG tablet Commonly known as: VIBRA-TABS Take 1 tablet (100 mg total) by mouth 2 (two) times daily for 5 days.   FLUoxetine 20 MG capsule Commonly known as: PROZAC Take 40 mg by mouth daily.   fluticasone 50 MCG/ACT nasal spray Commonly known as: FLONASE Place 2 sprays into both nostrils daily.   furosemide 20 MG tablet Commonly known as: Lasix Take 1 tablet (20 mg total) by mouth daily. For fluid   guaifenesin 100 MG/5ML syrup Commonly known as: ROBITUSSIN Take 200 mg by mouth at bedtime as needed for cough.   ipratropium-albuterol 0.5-2.5 (3) MG/3ML Soln Commonly known as: DUONEB Take 3 mLs by nebulization every 4 (four) hours as needed.   nicotine 14 mg/24hr patch Commonly known as: NICODERM CQ - dosed in mg/24 hours Place 1 patch (14 mg total) onto the skin daily.   optichamber diamond Misc USE DAILY WITH INHALER AS DIRECTED   oxyCODONE 15 MG immediate release tablet Commonly known as: ROXICODONE Take 15 mg by mouth in the morning, at noon, and at bedtime.   OXYGEN Inhale 3.5-4 L into the lungs continuous.   pantoprazole 40 MG tablet Commonly known as: PROTONIX Take 1 tablet (40 mg total) by mouth daily.   predniSONE 10 MG tablet Commonly known as: DELTASONE Take 1 tablet (10 mg total) by mouth daily with breakfast. What changed: See the new instructions.   predniSONE 20 MG tablet Commonly known as: DELTASONE Take 2 tablets (40 mg total) by mouth daily with breakfast for 5 days. What changed: You were already taking a  medication with the same name, and this prescription was added. Make sure you understand how and when to take each.   pregabalin 150 MG capsule Commonly known as: LYRICA Take 150 mg by mouth daily.   psyllium 58.6 % powder Commonly known as: METAMUCIL Take 1 packet by mouth every other day.   QUEtiapine 50 MG tablet Commonly known as: SEROQUEL Take 50 mg by mouth at bedtime.       Major procedures and Radiology Reports - PLEASE review detailed and final reports for all details, in brief -   ECHOCARDIOGRAM COMPLETE  Result Date: 10/28/2022    ECHOCARDIOGRAM REPORT   Patient Name:   Gloria Calderon Date of Exam: 10/28/2022 Medical Rec #:  [id number]          Height:       64.0 in Accession #:    [id number]         Weight:       102.9 lb Date of Birth:  09-26-1965           BSA:          1.475 m Patient Age:    49 years  BP:           127/79 mmHg Patient Gender: F                  HR:           99 bpm. Exam Location:  [facility] Procedure: 2D Echo, Cardiac Doppler and Color Doppler Indications:    Dyspnea R06.00  History:        Patient has no prior history of Echocardiogram examinations.                 COPD; Risk Factors:Current Smoker. Acute on chronic respiratory                 failure with hypoxia and hypercapnia. Asthma (From Hx).  Sonographer:    Alvino Chapel RCS Referring Phys: [phone] [facility]  Sonographer Comments: Technically difficult study due to poor echo windows. IMPRESSIONS  1. Left ventricular ejection fraction, by estimation, is 50 to 55%. The left ventricle has low normal function. The left ventricle has no regional wall motion abnormalities. Left ventricular diastolic parameters are consistent with Grade I diastolic dysfunction (impaired relaxation).  2. Right ventricular systolic function is low normal. The right ventricular size is normal. Tricuspid regurgitation signal is inadequate for assessing PA pressure.  3. The mitral valve is grossly normal. Trivial  mitral valve regurgitation.  4. The aortic valve was not well visualized. Aortic valve regurgitation is not visualized.  5. The inferior vena cava is normal in size with greater than 50% respiratory variability, suggesting right atrial pressure of 3 mmHg. Comparison(s): No prior Echocardiogram. FINDINGS  Left Ventricle: Left ventricular ejection fraction, by estimation, is 50 to 55%. The left ventricle has low normal function. The left ventricle has no regional wall motion abnormalities. The left ventricular internal cavity size was normal in size. There is no left ventricular hypertrophy. Left ventricular diastolic parameters are consistent with Grade I diastolic dysfunction (impaired relaxation). Right Ventricle: The right ventricular size is normal. No increase in right ventricular wall thickness. Right ventricular systolic function is low normal. Tricuspid regurgitation signal is inadequate for assessing PA pressure. Left Atrium: Left atrial size was normal in size. Right Atrium: Right atrial size was normal in size. Pericardium: Trivial pericardial effusion is present. The pericardial effusion is anterior to the right ventricle. Presence of epicardial fat layer. Mitral Valve: The mitral valve is grossly normal. Trivial mitral valve regurgitation. Tricuspid Valve: The tricuspid valve is grossly normal. Tricuspid valve regurgitation is trivial. Aortic Valve: The aortic valve was not well visualized. Aortic valve regurgitation is not visualized. Pulmonic Valve: The pulmonic valve was grossly normal. Pulmonic valve regurgitation is trivial. Aorta: The aortic root is normal in size and structure. Venous: The inferior vena cava is normal in size with greater than 50% respiratory variability, suggesting right atrial pressure of 3 mmHg. IAS/Shunts: No atrial level shunt detected by color flow Doppler.  LEFT VENTRICLE PLAX 2D LVIDd:         4.60 cm   Diastology LVIDs:         3.60 cm   LV e' medial:    5.77 cm/s LV PW:          0.80 cm   LV E/e' medial:  7.5 LV IVS:        0.70 cm   LV e' lateral:   5.87 cm/s LVOT diam:     1.80 cm   LV E/e' lateral: 7.4 LV SV:  26 LV SV Index:   18 LVOT Area:     2.54 cm  RIGHT VENTRICLE RV S prime:     7.83 cm/s TAPSE (M-mode): 1.4 cm LEFT ATRIUM           Index        RIGHT ATRIUM          Index LA diam:      2.50 cm 1.69 cm/m   RA Area:     6.06 cm LA Vol (A2C): 19.7 ml 13.36 ml/m  RA Volume:   9.13 ml  6.19 ml/m LA Vol (A4C): 10.8 ml 7.32 ml/m  AORTIC VALVE LVOT Vmax:   78.30 cm/s LVOT Vmean:  49.400 cm/s LVOT VTI:    0.102 m  AORTA Ao Root diam: 2.80 cm MITRAL VALVE MV Area (PHT): 6.96 cm    SHUNTS MV Decel Time: 109 msec    Systemic VTI:  0.10 m MV E velocity: 43.20 cm/s  Systemic Diam: 1.80 cm MV A velocity: 53.90 cm/s MV E/A ratio:  0.80 Rozann Lesches MD Electronically signed by Rozann Lesches MD Signature Date/Time: 10/28/2022/2:03:11 PM    Final    DG Chest Portable 1 View  Result Date: 10/26/2022 CLINICAL DATA:  Some chest tightness. Has had a cough she states is little productive. EXAM: PORTABLE CHEST 1 VIEW COMPARISON:  CT chest 10/02/2022, CT chest 02/07/2022 FINDINGS: Bilateral small pleural effusions. Bilateral mild interstitial thickening. Left suprahilar band like opacity unchanged compared with the prior CT dated 02/07/2022. Lungs are hyperinflated as can be seen with COPD. No focal consolidation. No pneumothorax. Stable cardiomediastinal silhouette. No acute osseous abnormality. IMPRESSION: 1. Bilateral interstitial thickening and bilateral small pleural effusions concerning for mild CHF. 2. COPD. Electronically Signed   By: Kathreen Devoid M.D.   On: 10/26/2022 15:12   CT Super D Chest Wo Contrast  Result Date: 10/03/2022 CLINICAL DATA:  Pulmonary nodule. EXAM: CT CHEST WITHOUT CONTRAST TECHNIQUE: Multidetector CT imaging of the chest was performed using thin slice collimation for electromagnetic bronchoscopy planning purposes, without intravenous contrast.  RADIATION DOSE REDUCTION: This exam was performed according to the departmental dose-optimization program which includes automated exposure control, adjustment of the mA and/or kV according to patient size and/or use of iterative reconstruction technique. COMPARISON:  [facility] chest CT 02/07/2022. [facility] CT 04/17/2022 FINDINGS: Cardiovascular: The heart size is normal. No substantial pericardial effusion. Mild atherosclerotic calcification is noted in the wall of the thoracic aorta. Mediastinum/Nodes: No mediastinal lymphadenopathy. No evidence for gross hilar lymphadenopathy although assessment is limited by the lack of intravenous contrast on the current study. The esophagus has normal imaging features. There is no axillary lymphadenopathy. Lungs/Pleura: Centrilobular and paraseptal emphysema evident. Biapical pleuroparenchymal scarring is similar to prior. Bandlike focus of consolidative opacity in the parahilar left upper lobe again noted. Dominant consolidative component of this lesion measures 2.6 x 1.0 cm on image 61 of series 4 today. Comparing to the 04/17/2022 exam and measuring at a similar level, the lesion was 2.7 x 1.0 cm at that time. This compares to 2.7 x 1.1 cm when remeasuring on 02/07/2022 exam at the same level. Medial right upper lobe pulmonary nodule measured at 14 x 4 mm on 02/07/2022 is 10 x 3 mm today on 32/4. No new suspicious pulmonary nodule or mass. No new focal airspace consolidation. No pleural effusion. Upper Abdomen: Visualized portion of the upper abdomen is unremarkable. Musculoskeletal: No worrisome lytic or sclerotic osseous abnormality. IMPRESSION: 1. Bandlike  focus of opacity in the left upper lobe with associated nodular component is stable in the interval since 04/17/2022 and also comparing back to a study from 02/07/2022. 2. Medial right upper lobe pulmonary nodule measured at 14 x 4 mm on 02/07/2022 exam is 10 x 3 mm today. 3. No new  suspicious pulmonary nodule or mass. 4. Aortic Atherosclerosis (ICD10-I70.0) and Emphysema (ICD10-J43.9). Electronically Signed   By: Misty Stanley M.D.   On: 10/03/2022 11:58    Micro Results   Recent Results (from the past 240 hour(s))  Resp panel by RT-PCR (RSV, Flu A&B, Covid) Anterior Nasal Swab     Status: None   Collection Time: 10/26/22  2:37 PM   Specimen: Anterior Nasal Swab  Result Value Ref Range Status   SARS Coronavirus 2 by RT PCR NEGATIVE NEGATIVE Final    Comment: (NOTE) SARS-CoV-2 target nucleic acids are NOT DETECTED.  The SARS-CoV-2 RNA is generally detectable in upper respiratory specimens during the acute phase of infection. The lowest concentration of SARS-CoV-2 viral copies this assay can detect is 138 copies/mL. A negative result does not preclude SARS-Cov-2 infection and should not be used as the sole basis for treatment or other patient management decisions. A negative result may occur with  improper specimen collection/handling, submission of specimen other than nasopharyngeal swab, presence of viral mutation(s) within the areas targeted by this assay, and inadequate number of viral copies(<138 copies/mL). A negative result must be combined with clinical observations, patient history, and epidemiological information. The expected result is Negative.  Fact Sheet for Patients:  EntrepreneurPulse.com.au  Fact Sheet for Healthcare Providers:  IncredibleEmployment.be  This test is no t yet approved or cleared by the [place] FDA and  has been authorized for detection and/or diagnosis of SARS-CoV-2 by FDA under an Emergency Use Authorization (EUA). This EUA will remain  in effect (meaning this test can be used) for the duration of the COVID-19 declaration under Section 564(b)(1) of the Act, 21 U.S.C.section 360bbb-3(b)(1), unless the authorization is terminated  or revoked sooner.       Influenza A by PCR NEGATIVE  NEGATIVE Final   Influenza B by PCR NEGATIVE NEGATIVE Final    Comment: (NOTE) The Xpert Xpress SARS-CoV-2/FLU/RSV plus assay is intended as an aid in the diagnosis of influenza from Nasopharyngeal swab specimens and should not be used as a sole basis for treatment. Nasal washings and aspirates are unacceptable for Xpert Xpress SARS-CoV-2/FLU/RSV testing.  Fact Sheet for Patients: EntrepreneurPulse.com.au  Fact Sheet for Healthcare Providers: IncredibleEmployment.be  This test is not yet approved or cleared by the [place] FDA and has been authorized for detection and/or diagnosis of SARS-CoV-2 by FDA under an Emergency Use Authorization (EUA). This EUA will remain in effect (meaning this test can be used) for the duration of the COVID-19 declaration under Section 564(b)(1) of the Act, 21 U.S.C. section 360bbb-3(b)(1), unless the authorization is terminated or revoked.     Resp Syncytial Virus by PCR NEGATIVE NEGATIVE Final    Comment: (NOTE) Fact Sheet for Patients: EntrepreneurPulse.com.au  Fact Sheet for Healthcare Providers: IncredibleEmployment.be  This test is not yet approved or cleared by the [place] FDA and has been authorized for detection and/or diagnosis of SARS-CoV-2 by FDA under an Emergency Use Authorization (EUA). This EUA will remain in effect (meaning this test can be used) for the duration of the COVID-19 declaration under Section 564(b)(1) of the Act, 21 U.S.C. section 360bbb-3(b)(1), unless the authorization is terminated or revoked.  Performed at [facility], [address] [place], [place] [address]    Today   Subjective    Norelle Finkelstein today has no new complaints  I called and updated patient's mother and sister -Patient was ambulated on 3 to 3-1/2 L of oxygen via nasal cannula O2 sats while ambulating up with ambulation was 94 to 95% -At baseline patient  uses 3.5 L of oxygen   Patient has been seen and examined prior to discharge   Objective   Blood pressure 127/79, pulse (!) 107, temperature 99 F (37.2 C), temperature source Oral, resp. rate 16, height 5' 4"$  (1.626 m), weight 46.7 kg, SpO2 93 %.   Intake/Output Summary (Last 24 hours) at 10/28/2022 1637 Last data filed at 10/28/2022 1500 Gross per 24 hour  Intake 240 ml  Output 1400 ml  Net -1160 ml    Exam Gen:- Awake Alert, in no acute distress  HEENT:- [place].AT, No sclera icterus Nose-  3.5L/min Neck-Supple Neck,No JVD,.  Lungs-improved air movement, no wheezing  CV- S1, S2 normal, regular  Abd-  +ve B.Sounds, Abd Soft, No tenderness,    Extremity/Skin:- No  edema, pedal pulses present  Psych-affect is appropriate, oriented x3 Neuro-no new focal deficits, no tremors   Data Review   CBC w Diff:  Lab Results  Component Value Date   WBC 5.0 10/27/2022   HGB 11.8 (L) 10/27/2022   HCT 38.9 10/27/2022   PLT 246 10/27/2022   LYMPHOPCT 8 10/27/2022   MONOPCT 7 10/27/2022   EOSPCT 0 10/27/2022   BASOPCT 0 10/27/2022    CMP:  Lab Results  Component Value Date   NA 139 10/28/2022   K 3.8 10/28/2022   CL 87 (L) 10/28/2022   CO2 42 (H) 10/28/2022   BUN 24 (H) 10/28/2022   CREATININE 0.60 10/28/2022   PROT 6.3 (L) 10/27/2022   ALBUMIN 3.3 (L) 10/27/2022   BILITOT 0.5 10/27/2022   ALKPHOS 70 10/27/2022   AST 22 10/27/2022   ALT 27 10/27/2022  .  Total Discharge time is about 33 minutes  Roxan Hockey M.D on 10/28/2022 at 4:37 PM  Go to [email or web address] -  for contact info  [facility] - Office  [phone]

## 2022-10-28 NOTE — Progress Notes (Signed)
Pt discharged via WC to POV. Pt's personal O2 concentrator is not working despite being plugged into Development worker, community, battery too low to use. Pt given O2 tank for home transport with instructions to return tank to hospital tomorrow. Pt  and family member state understanding. Pt states will return tomorrow.

## 2022-10-28 NOTE — Progress Notes (Signed)
*  PRELIMINARY RESULTS* Echocardiogram 2D Echocardiogram has been performed. Patient refused Definity at this time.  Samuel Germany 10/28/2022, 1:54 PM

## 2022-10-28 NOTE — Discharge Instructions (Signed)
  1) complete abstinence from tobacco advised- 2)you need oxygen at home at 3.5 L via nasal cannula continuously while awake and while asleep--- smoking or having open fires around oxygen can cause fire, significant injury and death 3)follow up with Jaynee Eagles, MD (primary care physician) in 1 week for recheck and evaluation) 4)Very low-salt diet advised--less than 2 g of sodium per day advised 5)Weigh yourself daily, call if you gain more than 3 pounds in 1 day or more than 5 pounds in 1 week as your diuretic medications may need to be adjusted 6) your thyroid may be overactive--- stop taking thyroid medicine (levothyroxine/Synthroid), Repeat TSH test in 4 to 6 weeks

## 2022-10-28 NOTE — Plan of Care (Signed)
  Problem: Education: Goal: Knowledge of General Education information will improve Description Including pain rating scale, medication(s)/side effects and non-pharmacologic comfort measures Outcome: Progressing   Problem: Health Behavior/Discharge Planning: Goal: Ability to manage health-related needs will improve Outcome: Progressing   

## 2022-10-28 NOTE — Progress Notes (Incomplete)
Mobility Specialist Progress Note:   Royetta Crochet Mobility Specialist Please contact via SecureChat or  Rehab office at [phone]

## 2022-10-28 NOTE — TOC Transition Note (Signed)
Transition of Care [facility]) - CM/SW Discharge Note   Patient Details  Name: Gloria Calderon MRN: [id number] Date of Birth: 1966-01-06  [facility] CM/SW Contact:  Shade Flood, LCSW Phone Number: 10/28/2022, 4:23 PM   Clinical Narrative:     Pt stable for dc with HHPT per MD. [place] arranged as requested. No other TOC needs for dc.  Final next level of care: [place] Barriers to Discharge: Barriers Resolved   Patient Goals and CMS Choice      Discharge Placement                         Discharge Plan and Services Additional resources added to the After Visit Summary for                            [facility] Arranged: PT HH Agency: [place] Date [place]: 10/28/22   Representative spoke with at [place]: [place] ([place]) Interventions SDOH Screenings   Tobacco Use: High Risk (10/26/2022)     Readmission Risk Interventions     No data to display

## 2022-10-28 NOTE — Progress Notes (Signed)
Mobility Specialist Progress Note:    10/28/22 1403  Mobility  Activity Transferred from bed to chair  Level of Assistance Independent  Assistive Device None  Distance Ambulated (ft) 2 ft  Activity Response Tolerated well  Mobility Referral Yes  $Mobility charge 1 Mobility   Pt agreeable to mobility session. Deferred ambulation in room since pt felt dizzy and weak d/t "not eating in 2 days". Sat pt up in chair to eat lunch, pt was agreeable to transfer. Tolerated well, c/o headache. Left pt in care of respiratory team, all needs met.  Royetta Crochet Mobility Specialist Please contact via Solicitor or  Rehab office at [phone]

## 2022-10-28 NOTE — Plan of Care (Signed)
  Problem: Education: Goal: Knowledge of General Education information will improve Description: Including pain rating scale, medication(s)/side effects and non-pharmacologic comfort measures 10/28/2022 1212 by Santa Lighter, RN Outcome: Adequate for Discharge 10/28/2022 0910 by Santa Lighter, RN Outcome: Progressing   Problem: Health Behavior/Discharge Planning: Goal: Ability to manage health-related needs will improve 10/28/2022 1212 by Santa Lighter, RN Outcome: Adequate for Discharge 10/28/2022 0910 by Santa Lighter, RN Outcome: Progressing   Problem: Clinical Measurements: Goal: Ability to maintain clinical measurements within normal limits will improve Outcome: Adequate for Discharge Goal: Will remain free from infection Outcome: Adequate for Discharge Goal: Diagnostic test results will improve Outcome: Adequate for Discharge Goal: Respiratory complications will improve Outcome: Adequate for Discharge Goal: Cardiovascular complication will be avoided Outcome: Adequate for Discharge   Problem: Activity: Goal: Risk for activity intolerance will decrease Outcome: Adequate for Discharge   Problem: Nutrition: Goal: Adequate nutrition will be maintained Outcome: Adequate for Discharge   Problem: Coping: Goal: Level of anxiety will decrease Outcome: Adequate for Discharge   Problem: Elimination: Goal: Will not experience complications related to bowel motility Outcome: Adequate for Discharge Goal: Will not experience complications related to urinary retention Outcome: Adequate for Discharge   Problem: Pain Managment: Goal: General experience of comfort will improve Outcome: Adequate for Discharge   Problem: Safety: Goal: Ability to remain free from injury will improve Outcome: Adequate for Discharge   Problem: Skin Integrity: Goal: Risk for impaired skin integrity will decrease Outcome: Adequate for Discharge   Problem: Education: Goal: Knowledge of disease or  condition will improve Outcome: Adequate for Discharge Goal: Knowledge of the prescribed therapeutic regimen will improve Outcome: Adequate for Discharge Goal: Individualized Educational Video(s) Outcome: Adequate for Discharge   Problem: Activity: Goal: Ability to tolerate increased activity will improve Outcome: Adequate for Discharge Goal: Will verbalize the importance of balancing activity with adequate rest periods Outcome: Adequate for Discharge   Problem: Respiratory: Goal: Ability to maintain a clear airway will improve Outcome: Adequate for Discharge Goal: Levels of oxygenation will improve Outcome: Adequate for Discharge Goal: Ability to maintain adequate ventilation will improve Outcome: Adequate for Discharge   Problem: Education: Goal: Knowledge of disease or condition will improve Outcome: Adequate for Discharge Goal: Knowledge of the prescribed therapeutic regimen will improve Outcome: Adequate for Discharge Goal: Individualized Educational Video(s) Outcome: Adequate for Discharge   Problem: Activity: Goal: Ability to tolerate increased activity will improve Outcome: Adequate for Discharge Goal: Will verbalize the importance of balancing activity with adequate rest periods Outcome: Adequate for Discharge   Problem: Respiratory: Goal: Ability to maintain a clear airway will improve Outcome: Adequate for Discharge Goal: Levels of oxygenation will improve Outcome: Adequate for Discharge Goal: Ability to maintain adequate ventilation will improve Outcome: Adequate for Discharge

## 2022-10-28 NOTE — Progress Notes (Signed)
Mobility Specialist Progress Note:    10/28/22 1446  Mobility  Activity Ambulated with assistance in hallway  Level of Assistance Contact guard assist, steadying assist  Assistive Device Front wheel walker  Distance Ambulated (ft) 30 ft  Activity Response Tolerated well  Mobility Referral Yes  $Mobility charge 1 Mobility   Doctor requested walking O2 stats for pt. Pt was agreeable to ambulating in room/hallway with nurse and myself. Tolerated well. Pt responded appropriately to RW hand placement cues. Pt c/o dizziness and weakness throughout. Returned pt to chair with all needs met, call bell within reach. See O2 stats below.   Pre Mobility (Sitting in Chair) : SpO2 91% on 3LO2 During Ambulation: SpO2 94% on 3LO2 Post Mobility (Sitting in Chair) : SpO2 91% on [place] Mobility Specialist Please contact via Solicitor or  Rehab office at [phone]

## 2022-10-28 NOTE — Progress Notes (Signed)
SATURATION QUALIFICATIONS: (This note is used to comply with regulatory documentation for home oxygen)  Patient Saturations on Room Air at Rest = %  Patient Saturations on Room Air while Ambulating = %  Patient Saturations on 3 Liters of oxygen while Ambulating = 94%  Please briefly explain why patient needs home oxygen: Patient uses 3.5 L of oxygen chronically at home so I did not walk her without oxygen. Resting on 3L patient was at 91%.

## 2022-10-28 NOTE — Progress Notes (Signed)
Removed IV-CDI. Reviewed d/c paperwork with patient and friend. Answered questions. Wheeled stable patient and belongings to main entrance. Her oxygen concentration would not work so we sent her home with a tank to be returned tomorrow.

## 2022-10-30 ENCOUNTER — Telehealth: Payer: Self-pay | Admitting: Emergency Medicine

## 2022-10-30 DIAGNOSIS — J9611 Chronic respiratory failure with hypoxia: Secondary | ICD-10-CM

## 2022-10-31 NOTE — Telephone Encounter (Signed)
Called and spoke with patient. Patient stated that [facility] said for her to bring her machine in so they can see what's going on with it. She stated that she meant to call us and let us know. Advised patient that if she has anymore issues or anything to give [place] a call if she needs to.   Nothing further needed.

## 2022-11-02 NOTE — Telephone Encounter (Signed)
Thank you.  If [facility] looks at the device and needs new orders from [place] then okay to provide.

## 2022-11-03 NOTE — Telephone Encounter (Signed)
[facility] X1 mailbox is full. Will try to call patient again later

## 2022-11-05 ENCOUNTER — Other Ambulatory Visit: Payer: Self-pay | Admitting: Nurse Practitioner

## 2022-11-05 NOTE — Addendum Note (Signed)
Addended by: Rosana Berger on: 11/05/2022 10:24 AM   Modules accepted: Orders

## 2022-11-05 NOTE — Telephone Encounter (Signed)
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

## 2023-01-28 ENCOUNTER — Other Ambulatory Visit: Payer: Self-pay

## 2023-01-28 ENCOUNTER — Emergency Department (HOSPITAL_COMMUNITY): Payer: Medicare PPO

## 2023-01-28 ENCOUNTER — Encounter (HOSPITAL_COMMUNITY): Payer: Self-pay | Admitting: Emergency Medicine

## 2023-01-28 ENCOUNTER — Emergency Department (HOSPITAL_COMMUNITY)
Admission: EM | Admit: 2023-01-28 | Discharge: 2023-01-28 | Disposition: A | Discharge status: Home / Self Care | Payer: Medicare PPO | Attending: Emergency Medicine | Admitting: Emergency Medicine

## 2023-01-28 DIAGNOSIS — E039 Hypothyroidism, unspecified: Secondary | ICD-10-CM | POA: Diagnosis not present

## 2023-01-28 DIAGNOSIS — J441 Chronic obstructive pulmonary disease with (acute) exacerbation: Secondary | ICD-10-CM | POA: Diagnosis not present

## 2023-01-28 DIAGNOSIS — R062 Wheezing: Secondary | ICD-10-CM | POA: Diagnosis present

## 2023-01-28 DIAGNOSIS — Z7951 Long term (current) use of inhaled steroids: Secondary | ICD-10-CM | POA: Diagnosis not present

## 2023-01-28 LAB — CBC WITH DIFFERENTIAL/PLATELET
Abs Immature Granulocytes: 0.05 10*3/uL (ref 0.00–0.07)
Basophils Absolute: 0 10*3/uL (ref 0.0–0.1)
Basophils Relative: 0 %
Eosinophils Absolute: 0 10*3/uL (ref 0.0–0.5)
Eosinophils Relative: 1 %
HCT: 39.5 % (ref 36.0–46.0)
Hemoglobin: 12 g/dL (ref 12.0–15.0)
Immature Granulocytes: 1 %
Lymphocytes Relative: 13 %
Lymphs Abs: 1.2 10*3/uL (ref 0.7–4.0)
MCH: 30.8 pg (ref 26.0–34.0)
MCHC: 30.4 g/dL (ref 30.0–36.0)
MCV: 101.5 fL — ABNORMAL HIGH (ref 80.0–100.0)
Monocytes Absolute: 0.6 10*3/uL (ref 0.1–1.0)
Monocytes Relative: 6 %
Neutro Abs: 7 10*3/uL (ref 1.7–7.7)
Neutrophils Relative %: 79 %
Platelets: 275 10*3/uL (ref 150–400)
RBC: 3.89 MIL/uL (ref 3.87–5.11)
RDW: 13.5 % (ref 11.5–15.5)
WBC: 8.9 10*3/uL (ref 4.0–10.5)
nRBC: 0 % (ref 0.0–0.2)

## 2023-01-28 LAB — COMPREHENSIVE METABOLIC PANEL
ALT: 63 U/L — ABNORMAL HIGH (ref 0–44)
AST: 48 U/L — ABNORMAL HIGH (ref 15–41)
Albumin: 3.4 g/dL — ABNORMAL LOW (ref 3.5–5.0)
Alkaline Phosphatase: 65 U/L (ref 38–126)
Anion gap: 9 (ref 5–15)
BUN: 20 mg/dL (ref 6–20)
CO2: 39 mmol/L — ABNORMAL HIGH (ref 22–32)
Calcium: 9 mg/dL (ref 8.9–10.3)
Chloride: 92 mmol/L — ABNORMAL LOW (ref 98–111)
Creatinine, Ser: 0.62 mg/dL (ref 0.44–1.00)
GFR, Estimated: >60 mL/min (ref >60)
Glucose, Bld: 108 mg/dL — ABNORMAL HIGH (ref 70–99)
Potassium: 4.2 mmol/L (ref 3.5–5.1)
Sodium: 140 mmol/L (ref 135–145)
Total Bilirubin: 0.8 mg/dL (ref 0.3–1.2)
Total Protein: 5.9 g/dL — ABNORMAL LOW (ref 6.5–8.1)

## 2023-01-28 MED ORDER — AMOXICILLIN-POT CLAVULANATE 875-125 MG PO TABS: 1.0000 | ORAL_TABLET | Freq: Two times a day (BID) | ORAL | 0 refills | Status: DC

## 2023-01-28 MED ORDER — IPRATROPIUM-ALBUTEROL 0.5-2.5 (3) MG/3ML IN SOLN
3.0000 mL | Freq: Once | RESPIRATORY_TRACT | Status: AC
  Administered 2023-01-28: 3 mL via RESPIRATORY_TRACT
  Filled 2023-01-28: qty 3

## 2023-01-28 MED ORDER — METHYLPREDNISOLONE SODIUM SUCC 125 MG IJ SOLR
125.0000 mg | Freq: Once | INTRAMUSCULAR | Status: AC
  Administered 2023-01-28: 125 mg via INTRAVENOUS
  Filled 2023-01-28: qty 2

## 2023-01-28 MED ORDER — PREDNISONE 20 MG PO TABS: 60.0000 mg | ORAL_TABLET | Freq: Every day | ORAL | 0 refills | Status: AC

## 2023-01-28 NOTE — ED Provider Notes (Signed)
[place] EMERGENCY DEPARTMENT AT [facility] Provider Note   CSN: [id number] Arrival date & time: 01/28/23  8413     History  Chief Complaint  Patient presents with   Shortness of Breath    Gloria Calderon is a 57 y.o. female.  57 year old female with past medical history significant for COPD presents today for couple complaints.  She does report some wheezing and cough but no worsening in her dyspnea.  She states her primary concern was coming in to get second opinion regarding her heartburn.  She states this is something she battles chronically.  She states her mom also battles the same thing and has been started on Celebrex with improvement in her symptoms and she wanted to know if that might be a good idea for her.  She states she recently discussed it with her PCP who increased her PPI to twice daily.  She had significant improvement in symptoms following this.  The history is provided by the patient. No language interpreter was used.       Home Medications Prior to Admission medications   Medication Sig Start Date End Date Taking? Authorizing Provider  albuterol (VENTOLIN HFA) 108 (90 Base) MCG/ACT inhaler Inhale 2 puffs into the lungs every 6 (six) hours as needed for wheezing or shortness of breath. 10/28/22   Shon Hale, MD  ALPRAZolam Prudy Feeler) 1 MG tablet Take 1 tablet (1 mg total) by mouth 4 (four) times daily as needed for anxiety. Patient taking differently: Take 1 mg by mouth 3 (three) times daily as needed for anxiety. 01/12/18   Leslye Peer, MD  bisacodyl (DULCOLAX) 5 MG EC tablet Take 5 mg by mouth daily as needed for moderate constipation.     [provider]  Budeson-Glycopyrrol-Formoterol (BREZTRI AEROSPHERE) 160-9-4.8 MCG/ACT AERO Inhale 2 puffs into the lungs in the morning and at bedtime. 04/15/22   Leslye Peer, MD  Carboxymethylcellulose Sod PF 0.5 % SOLN Place 2 drops into both eyes 2 (two) times daily.    [provider]  celecoxib (CELEBREX) 200 MG capsule Take 200 mg by mouth 2 (two) times daily. 09/18/20   [provider]  COLLAGEN PO Take 1 Scoop by mouth daily at 6 (six) AM.    [provider]  cyclobenzaprine (FLEXERIL) 10 MG tablet Take 30 mg by mouth 3 (three) times daily.     [provider]  dextromethorphan-guaiFENesin (MUCINEX DM) 30-600 MG 12hr tablet Take 1 tablet by mouth 2 (two) times daily. 10/28/22   Shon Hale, MD  FLUoxetine (PROZAC) 20 MG capsule Take 40 mg by mouth daily. 06/04/22   [provider]  fluticasone (FLONASE) 50 MCG/ACT nasal spray Place 2 sprays into both nostrils daily.    [provider]  furosemide (LASIX) 20 MG tablet Take 1 tablet (20 mg total) by mouth daily. For fluid 10/28/22 10/28/23  Shon Hale, MD  guaifenesin (ROBITUSSIN) 100 MG/5ML syrup Take 200 mg by mouth at bedtime as needed for cough.    [provider]  ipratropium-albuterol (DUONEB) 0.5-2.5 (3) MG/3ML SOLN Take 3 mLs by nebulization every 4 (four) hours as needed. 10/28/22   Shon Hale, MD  nicotine (NICODERM CQ - DOSED IN MG/24 HOURS) 14 mg/24hr patch Place 1 patch (14 mg total) onto the skin daily. 10/28/22   Shon Hale, MD  oxyCODONE (ROXICODONE) 15 MG immediate release tablet Take 15 mg by mouth in the morning, at noon, and at bedtime.    [provider]  OXYGEN Inhale 3.5-4 L into the lungs continuous.    [provider]  pantoprazole (PROTONIX) 40 MG tablet Take 1 tablet (40 mg total) by mouth daily. 10/28/22   Shon Hale, MD  predniSONE (DELTASONE) 10 MG tablet Take 1 tablet (10 mg total) by mouth daily with breakfast. 10/28/22   Mariea Clonts, Courage, MD  pregabalin (LYRICA) 150 MG capsule Take 150 mg by mouth daily.    [provider]  psyllium (METAMUCIL) 58.6 % powder Take 1 packet by mouth every other day.    [provider]  QUEtiapine (SEROQUEL) 50 MG tablet Take 50 mg by mouth at bedtime.     [provider]  Spacer/Aero-Holding [facility] DIAMOND) MISC USE DAILY WITH INHALER AS DIRECTED 09/12/21   Leslye Peer, MD      Allergies    Isosorbide, Levaquin [levofloxacin], and Morphine and codeine    Review of Systems   Review of Systems  Constitutional:  Negative for chills and fever.  HENT:  Negative for congestion, sore throat and trouble swallowing.   Respiratory:  Positive for cough and wheezing. Negative for shortness of breath.   Cardiovascular:  Negative for chest pain, palpitations and leg swelling.  Neurological:  Negative for light-headedness.  All other systems reviewed and are negative.   Physical Exam Updated Vital Signs BP 103/65   Pulse 93   Temp 98 F (36.7 C) (Oral)   Resp (!) 27   Ht 5\' 4"  (1.626 m)   Wt 47 kg   SpO2 98%   BMI 17.79 kg/m  Physical Exam Vitals and nursing note reviewed.  Constitutional:      General: She is not in acute distress.    Appearance: Normal appearance. She is ill-appearing (chronically).  HENT:     Head: Normocephalic and atraumatic.     Nose: Nose normal.  Eyes:     General: No scleral icterus.    Extraocular Movements: Extraocular movements intact.     Conjunctiva/sclera: Conjunctivae normal.  Cardiovascular:     Rate and Rhythm: Normal rate and regular rhythm.     Pulses: Normal pulses.  Pulmonary:     Effort: Pulmonary effort is normal. No respiratory distress.     Breath sounds: Wheezing present. No rales.  Musculoskeletal:        General: Normal range of motion.     Cervical back: Normal range of motion.  Skin:    General: Skin is warm and dry.  Neurological:     General: No focal deficit present.     Mental Status: She is alert. Mental status is at baseline.     ED Results / Procedures / Treatments   Labs (all labs ordered are listed, but only abnormal results are displayed) Labs Reviewed  CBC WITH DIFFERENTIAL/PLATELET - Abnormal; Notable for the following components:       Result Value   MCV 101.5 (*)    All other components within normal limits  COMPREHENSIVE METABOLIC PANEL    EKG EKG Interpretation  Date/Time:  Wednesday Jan 28 2023 07:22:48 EDT Ventricular Rate:  93 PR Interval:  120 QRS Duration: 85 QT Interval:  393 QTC Calculation: 489 R Axis:   109 Text Interpretation: Sinus rhythm Ventricular premature complex Right axis deviation Borderline T abnormalities, anterior leads Borderline prolonged QT interval Confirmed by Ernie Avena ([id number]) on 01/28/2023 8:03:41 AM  Radiology DG Chest Port 1 View  Result Date: 01/28/2023 CLINICAL DATA:  Shortness of breath EXAM: PORTABLE CHEST 1 VIEW  COMPARISON:  CXR 10/26/22 FINDINGS: No pleural effusion. No pneumothorax. Pulmonary hyperinflation. Unchanged cardiac and mediastinal contours. No focal airspace opacity. No radiographically apparent displaced rib fractures. Architectural distortion in the bilateral upper lung fields. Visualized upper abdomen is unremarkable. IMPRESSION: Findings of obstructive lung disease without a focal airspace opacity Electronically Signed   By: Lorenza Cambridge M.D.   On: 01/28/2023 07:38    Procedures Procedures    Medications Ordered in ED Medications  ipratropium-albuterol (DUONEB) 0.5-2.5 (3) MG/3ML nebulizer solution 3 mL (3 mLs Nebulization Given 01/28/23 0726)  methylPREDNISolone sodium succinate (SOLU-MEDROL) 125 mg/2 mL injection 125 mg (125 mg Intravenous Given 01/28/23 0735)    ED Course/ Medical Decision Making/ A&P                             Medical Decision Making Amount and/or Complexity of Data Reviewed Labs: ordered. Radiology: ordered.  Risk Prescription drug management.   Medical Decision Making / ED Course   This patient presents to the ED for concern of shortness of breath, this involves an extensive number of treatment options, and is a complaint that carries with it a high risk of complications and morbidity.  The differential diagnosis  includes viral URI, COPD exacerbation, pneumonia  MDM: 57 year old female who is chronically ill on 3.5 L of supplemental O2 presents today for wheezing, and shortness of breath.  Initially she denies any shortness of breath.  Sister later present at the bedside and reported patient has been having some shortness of breath.  Denies any chest pain.  Does have cough and wheezing during exam.  Cough is nonproductive.  Afebrile.  No chest pain or other anginal symptoms.  Will provide DuoNeb, Solu-Medrol.  Will reevaluate.  Chest x-ray without evidence of pneumonia.  CBC without leukocytosis or anemia.  CMP with preserved renal function and without electrolyte abnormalities.  CO2 elevated at 39 but this is chronic.  Discussion had with patient.  She reports she does feel better after the medications.  Sister at bedside.  She lives with patient.  She does have history of severe COPD and has required admission in the past.  He stated that if she does not feel better we can provide additional medication and potential admission.  They state they feel comfortable going home at this time with antibiotic in addition to prednisone and they will follow-up with her pulmonologist.  Feel this is reasonable.  Strict return precautions given.  I did ambulate patient at bedside.  She was able to ambulate without desaturation.  Patient is appropriate for discharge.  Discharged in stable condition.  Return precaution discussed.  Patient voices understanding and is in agreement with plan.  Lab Tests: -I ordered, reviewed, and interpreted labs.   The pertinent results include:   Labs Reviewed  CBC WITH DIFFERENTIAL/PLATELET - Abnormal; Notable for the following components:      Result Value   MCV 101.5 (*)    All other components within normal limits  COMPREHENSIVE METABOLIC PANEL - Abnormal; Notable for the following components:   Chloride 92 (*)    CO2 39 (*)    Glucose, Bld 108 (*)    Total Protein 5.9 (*)    Albumin  3.4 (*)    AST 48 (*)    ALT 63 (*)    All other components within normal limits      EKG  EKG Interpretation  Date/Time:  Wednesday Jan 28 2023 07:22:48 EDT  Ventricular Rate:  93 PR Interval:  120 QRS Duration: 85 QT Interval:  393 QTC Calculation: 489 R Axis:   109 Text Interpretation: Sinus rhythm Ventricular premature complex Right axis deviation Borderline T abnormalities, anterior leads Borderline prolonged QT interval Confirmed by Ernie Avena ([id number]) on 01/28/2023 8:03:41 AM         Imaging Studies ordered: I ordered imaging studies including CXR I independently visualized and interpreted imaging. I agree with the radiologist interpretation   Medicines ordered and prescription drug management: Meds ordered this encounter  Medications   ipratropium-albuterol (DUONEB) 0.5-2.5 (3) MG/3ML nebulizer solution 3 mL   methylPREDNISolone sodium succinate (SOLU-MEDROL) 125 mg/2 mL injection 125 mg    IV methylprednisolone will be converted to either a q12h or q24h frequency with the same total daily dose (TDD).  Ordered Dose: 1 to 125 mg TDD; convert to: TDD q24h.  Ordered Dose: 126 to 250 mg TDD; convert to: TDD div q12h.  Ordered Dose: >250 mg TDD; DAW.    -I have reviewed the patients home medicines and have made adjustments as needed  Cardiac Monitoring: The patient was maintained on a cardiac monitor.  I personally viewed and interpreted the cardiac monitored which showed an underlying rhythm of: NSR   Reevaluation: After the interventions noted above, I reevaluated the patient and found that they have :improved  Co morbidities that complicate the patient evaluation  Past Medical History:  Diagnosis Date   Anxiety    Arthritis    Asthma    Chronic back pain    Constipation 04/02/2017   COPD (chronic obstructive pulmonary disease) (HCC)    DDD (degenerative disc disease), lumbar    Depression    Dyspnea    Fibromyalgia    GERD (gastroesophageal reflux  disease)    H/O seasonal allergies    Headache    Hypothyroidism    Osteoporosis    Pneumonia 2008   Ringing in ear, right    Sciatica    Spinal stenosis       Dispostion: Patient is appropriate for discharge.  Discharged in stable condition.  Return precaution discussed.  Patient voices understanding and is in agreement with plan.  Final Clinical Impression(s) / ED Diagnoses Final diagnoses:  COPD exacerbation (HCC)    Rx / DC Orders ED Discharge Orders          Ordered    amoxicillin-clavulanate (AUGMENTIN) 875-125 MG tablet  Every 12 hours        01/28/23 0914    predniSONE (DELTASONE) 20 MG tablet  Daily with breakfast        01/28/23 0914              Marita [place], PA-C 01/28/23 0915    Ernie Avena, MD 01/28/23 1125

## 2023-01-28 NOTE — Discharge Instructions (Signed)
Your workup today was reassuring.  Likely of COPD exacerbation.  Prednisone, and Augmentin prescribed.  Continue taking your pantoprazole for acid reflux type status given your symptoms under control.  For any concerning or worsening symptoms return to the emergency department.

## 2023-01-28 NOTE — ED Triage Notes (Signed)
Patient c/o increasing shob x 3 days.  Patient has COPD and is chronically on 3.5 L [place].  Patient denies pain.

## 2023-02-16 ENCOUNTER — Telehealth: Payer: Self-pay | Admitting: Emergency Medicine

## 2023-02-18 NOTE — Telephone Encounter (Signed)
Patient is scheduled for ct

## 2023-03-30 ENCOUNTER — Encounter: Payer: Self-pay | Admitting: Emergency Medicine

## 2023-04-06 ENCOUNTER — Ambulatory Visit
Admission: RE | Admit: 2023-04-06 | Discharge: 2023-04-06 | Disposition: A | Discharge status: Home / Self Care | Payer: Medicare PPO | Source: Ambulatory Visit | Attending: Emergency Medicine | Admitting: Emergency Medicine

## 2023-04-06 DIAGNOSIS — R911 Solitary pulmonary nodule: Secondary | ICD-10-CM

## 2023-04-13 ENCOUNTER — Telehealth: Payer: Self-pay | Admitting: Emergency Medicine

## 2023-04-13 NOTE — Telephone Encounter (Signed)
[employer] calling w/Call Report.

## 2023-04-13 NOTE — Telephone Encounter (Signed)
Call report on CT dated 04/06/23  Impression:  IMPRESSION: 1. Very limited motion degraded scan. 2. Mild superior T5 vertebral compression fracture, new from 10/02/2022 chest CT, age indeterminate, potentially acute. 3. New small clustered indistinct posteromedial right lower lobe pulmonary nodules, largest 0.5 cm, nonspecific but more likely infectious/inflammatory. Recommend attention on follow-up noncontrast chest CT in 6 months. 4. Irregular nodular and bandlike left upper lobe consolidation is decreased from 10/02/2022 chest CT, most compatible with evolving postinfectious/postinflammatory scarring. 5. Solid curvilinear 1.4 x 0.2 cm posteromedial right upper lobe nodule is stable and presumably benign. 6. Severe centrilobular emphysema with mild diffuse bronchial wall thickening, suggesting COPD. 7. Aortic Atherosclerosis (ICD10-I70.0) and Emphysema (ICD10-J43.9).   These results will be called to the ordering clinician or representative by the Radiologist Assistant, and communication documented in the PACS or [employer].     Electronically Signed   By: Delbert Phenix M.D.   On: 04/13/2023 16:28  Routing to Dr Delton Coombes

## 2023-04-14 ENCOUNTER — Ambulatory Visit: Payer: Medicare PPO | Admitting: Emergency Medicine

## 2023-04-14 NOTE — Telephone Encounter (Signed)
Thank you :)

## 2023-04-30 ENCOUNTER — Ambulatory Visit: Payer: Medicare PPO | Admitting: Emergency Medicine

## 2023-05-01 ENCOUNTER — Other Ambulatory Visit: Payer: Self-pay | Admitting: Emergency Medicine

## 2023-05-13 ENCOUNTER — Encounter: Payer: Self-pay | Admitting: Emergency Medicine

## 2023-05-13 ENCOUNTER — Ambulatory Visit: Payer: Medicare PPO | Admitting: Emergency Medicine

## 2023-05-13 VITALS — BP 100/58 | HR 103 | Temp 98.6°F | Ht 64.0 in | Wt 100.0 lb

## 2023-05-13 DIAGNOSIS — J431 Panlobular emphysema: Secondary | ICD-10-CM | POA: Diagnosis not present

## 2023-05-13 DIAGNOSIS — R911 Solitary pulmonary nodule: Secondary | ICD-10-CM

## 2023-05-13 DIAGNOSIS — R918 Other nonspecific abnormal finding of lung field: Secondary | ICD-10-CM

## 2023-05-13 DIAGNOSIS — J9611 Chronic respiratory failure with hypoxia: Secondary | ICD-10-CM

## 2023-05-13 MED ORDER — AZITHROMYCIN 250 MG PO TABS: 250.0000 mg | ORAL_TABLET | Freq: Every day | ORAL | 11 refills | Status: AC

## 2023-05-13 NOTE — Assessment & Plan Note (Signed)
Continue your oxygen at 3.5 to 5 L/min depending on your level of exertion and whether you on a continuous flow or pulse system.  Our goal is to keep your oxygen saturations > 90%

## 2023-05-13 NOTE — Patient Instructions (Addendum)
Please continue your Breztri 2 puffs twice a day.  Rinse and gargle after using. Use your albuterol either 1 nebulizer treatment or 2 puffs up to every 4 hours when needed for shortness of breath, chest tightness, wheezing. Continue your prednisone 10 mg once daily. We will start azithromycin 250 mg once daily.  Take this every day on a schedule. Get your flu shot this fall Consider getting the COVID-19 vaccine this fall Continue your oxygen at 3.5 to 5 L/min depending on your level of exertion and whether you on a continuous flow or pulse system.  Our goal is to keep your oxygen saturations > 90% We reviewed your CT scan of the chest today.  Overall it is stable.  We will plan to repeat your CT chest at [facility] in 6 months (January 2025). Follow Dr. Delton Coombes in January 2025 after your CT chest so we can review those results together.

## 2023-05-13 NOTE — Progress Notes (Signed)
Subjective:    Patient ID: Gloria Calderon, female    DOB: [birth year], 57 y.o.   MRN: [id number]  COPD She complains of cough, shortness of breath and wheezing. Associated symptoms include postnasal drip. Pertinent negatives include no appetite change, ear pain, fever, headaches, rhinorrhea, sneezing, sore throat or trouble swallowing. Her past medical history is significant for COPD.   ROV 10/09/2022 --57 year old woman with active tobacco use and very severe COPD, frequent exacerbations and bronchitic symptoms.  She also has a history of pulmonary nodular disease and some linear parenchymal thickening that we have been following with repeat CTs.  Some of her CTs have been at [facility].  Overall the lesions of interest have been stable.  She is managed on Breztri, prednisone 10 mg.  She uses albuterol approximately 4x a day She reports that she has been doing fairly well. Same limitations. Some cough. 5L/min pulsed, 3.5-4 L/min continuous.  She is down to 5 cig a day, is vaping.   CT scan of the chest done 10/02/2022 reviewed by me, shows centrilobular and paraseptal emphysema with biapical pleural-parenchymal scarring unchanged.  There is a perihilar left upper lobe bandlike focus, again unchanged.  Her medial right upper lobe pulmonary nodule has decreased in size a 10 x 3 mm.  No other new suspicious lesions seen.   ROV 05/13/2023 --Gloria Calderon is 57, active smoker with very severe COPD and a chronic bronchitic phenotype, hypoxemic respiratory failure on 3.5-5 L/min depending on exertion and whether she is on a pulsed flow system.  She still smoking approximately.  We have been following waxing and waning pulmonary nodular disease on CT scan of the chest.  Most recent scan done 04/06/2023 She has been fairly stable. She has severe baseline exertional SOB. On Breztri, nebs about bid. She on pred 10mg .  New pharmacy [facility]  CT chest 04/06/2023 reviewed by me shows some motion degradation  inferiorly, no mediastinal or hilar adenopathy.,  Severe centrilobular emphysema and bronchial wall thickening.  Unchanged posterior medial right upper lobe curvilinear lesion 1.4 x 0.2 cm.  There is a smaller irregular nodular bandlike left upper lobe area of consolidation now 1.5 x 0.6 cm.  There is some new small clustered and is pink posterolateral right lower lobe nodules, largest 0.5 cm.   Review of Systems  Constitutional:  Negative for appetite change, fever and unexpected weight change.  HENT:  Positive for congestion and postnasal drip. Negative for dental problem, ear pain, nosebleeds, rhinorrhea, sinus pressure, sneezing, sore throat and trouble swallowing.   Eyes:  Negative for redness and itching.  Respiratory:  Positive for cough, chest tightness, shortness of breath and wheezing.   Cardiovascular:  Negative for palpitations and leg swelling.  Gastrointestinal:  Negative for abdominal pain, nausea and vomiting.  Genitourinary:  Negative for dysuria.  Musculoskeletal:  Negative for joint swelling.  Skin:  Negative for rash.  Neurological:  Negative for headaches.  Hematological:  Does not bruise/bleed easily.  Psychiatric/Behavioral:  Negative for dysphoric mood. The patient is not nervous/anxious.        Objective:   Physical Exam Vitals:   05/13/23 1048  BP: (!) 100/58  Pulse: (!) 103  Temp: 98.6 F (37 C)  TempSrc: Oral  SpO2: 97%  Weight: 100 lb (45.4 kg)  Height: 5\' 4"  (1.626 m)   Gen: Pleasant, well-nourished, in no distress, ill appearing  ENT: No lesions,  mouth clear,  oropharynx clear, nasal congestion, strong voice  Neck: No JVD, no stridor  Lungs: No use of accessory muscles, B exp wheezes  Cardiovascular: RRR, heart sounds normal, no murmur or gallops, no peripheral edema  Musculoskeletal: No deformities, no cyanosis or clubbing  Neuro: alert, non focal  Skin: Warm, no lesions or rashes      Assessment & Plan:  COPD (chronic obstructive  pulmonary disease) (HCC) Please continue your Breztri 2 puffs twice a day.  Rinse and gargle after using. Use your albuterol either 1 nebulizer treatment or 2 puffs up to every 4 hours when needed for shortness of breath, chest tightness, wheezing. Continue your prednisone 10 mg once daily. We will start azithromycin 250 mg once daily.  Take this every day on a schedule. Get your flu shot this fall Consider getting the COVID-19 vaccine this fall  Chronic respiratory failure with hypoxia (HCC) Continue your oxygen at 3.5 to 5 L/min depending on your level of exertion and whether you on a continuous flow or pulse system.  Our goal is to keep your oxygen saturations > 90%  Pulmonary nodules Linear opacities that have been stable on serial imaging, slightly smaller.  She has some scattered waxing waning pulmonary nodule disease that will need to be followed.  Next CT chest in 6 months.  We reviewed your CT scan of the chest today.  Overall it is stable.  We will plan to repeat your CT chest at [facility] in 6 months (January 2025). Follow Dr. Delton Coombes in January 2025 after your CT chest so we can review those results together.   Levy Pupa, MD, PhD 05/13/2023, 2:29 PM [facility] [phone] or if no answer [phone]

## 2023-05-13 NOTE — Assessment & Plan Note (Signed)
Please continue your Breztri 2 puffs twice a day.  Rinse and gargle after using. Use your albuterol either 1 nebulizer treatment or 2 puffs up to every 4 hours when needed for shortness of breath, chest tightness, wheezing. Continue your prednisone 10 mg once daily. We will start azithromycin 250 mg once daily.  Take this every day on a schedule. Get your flu shot this fall Consider getting the COVID-19 vaccine this fall

## 2023-05-13 NOTE — Assessment & Plan Note (Signed)
Linear opacities that have been stable on serial imaging, slightly smaller.  She has some scattered waxing waning pulmonary nodule disease that will need to be followed.  Next CT chest in 6 months.  We reviewed your CT scan of the chest today.  Overall it is stable.  We will plan to repeat your CT chest at [facility] in 6 months (January 2025). Follow Dr. Delton Coombes in January 2025 after your CT chest so we can review those results together.

## 2023-07-02 ENCOUNTER — Ambulatory Visit: Payer: Medicare PPO | Admitting: Emergency Medicine

## 2023-07-17 DEATH — deceased

## 2023-10-06 ENCOUNTER — Ambulatory Visit (HOSPITAL_COMMUNITY): Payer: Self-pay

## 2023-10-15 ENCOUNTER — Encounter: Payer: Self-pay | Admitting: Emergency Medicine

## 2023-10-15 ENCOUNTER — Ambulatory Visit: Payer: Medicare PPO | Admitting: Emergency Medicine

## 2023-10-15 ENCOUNTER — Telehealth: Payer: Self-pay

## 2023-10-15 NOTE — Telephone Encounter (Signed)
Pt needs her CT scheduled. This was ordered in August 2024 by Dr Delton Coombes. Pt will need a f/u after the CT is completed.

## 2023-10-15 NOTE — Telephone Encounter (Signed)
Forwarding to Dr Delton Coombes so he is aware.

## 2023-10-15 NOTE — Telephone Encounter (Signed)
Thank you. We will try to reschedule if she is able

## 2024-02-22 ENCOUNTER — Encounter (INDEPENDENT_AMBULATORY_CARE_PROVIDER_SITE_OTHER): Payer: Self-pay | Admitting: *Deleted
